# Patient Record
Sex: Male | Born: 1955 | Race: Black or African American | Hispanic: No | Marital: Married | Smoking: Never smoker
Health system: Southern US, Community
[De-identification: ages and names within clinical notes are randomized; demographics above are authoritative.]

## PROBLEM LIST (undated history)

## (undated) DIAGNOSIS — I252 Old myocardial infarction: Secondary | ICD-10-CM

## (undated) DIAGNOSIS — I2510 Atherosclerotic heart disease of native coronary artery without angina pectoris: Secondary | ICD-10-CM

## (undated) DIAGNOSIS — N529 Male erectile dysfunction, unspecified: Secondary | ICD-10-CM

## (undated) DIAGNOSIS — E785 Hyperlipidemia, unspecified: Secondary | ICD-10-CM

## (undated) DIAGNOSIS — T464X5A Adverse effect of angiotensin-converting-enzyme inhibitors, initial encounter: Secondary | ICD-10-CM

## (undated) DIAGNOSIS — R05 Cough: Secondary | ICD-10-CM

## (undated) DIAGNOSIS — Z9289 Personal history of other medical treatment: Secondary | ICD-10-CM

## (undated) DIAGNOSIS — I10 Essential (primary) hypertension: Secondary | ICD-10-CM

## (undated) HISTORY — DX: Old myocardial infarction: I25.2

## (undated) HISTORY — PX: NO PAST SURGERIES: SHX2092

## (undated) HISTORY — DX: Atherosclerotic heart disease of native coronary artery without angina pectoris: I25.10

## (undated) HISTORY — DX: Cough: R05

## (undated) HISTORY — DX: Personal history of other medical treatment: Z92.89

## (undated) HISTORY — DX: Male erectile dysfunction, unspecified: N52.9

## (undated) HISTORY — DX: Adverse effect of angiotensin-converting-enzyme inhibitors, initial encounter: T46.4X5A

## (undated) HISTORY — DX: Essential (primary) hypertension: I10

---

## 1999-02-28 ENCOUNTER — Encounter: Payer: Self-pay | Admitting: Family Medicine

## 1999-03-26 ENCOUNTER — Encounter: Payer: Self-pay | Admitting: Family Medicine

## 1999-03-26 ENCOUNTER — Encounter: Admission: RE | Admit: 1999-03-26 | Discharge: 1999-03-26 | Payer: Self-pay | Admitting: Family Medicine

## 2005-05-12 ENCOUNTER — Emergency Department (HOSPITAL_COMMUNITY): Admission: EM | Admit: 2005-05-12 | Discharge: 2005-05-12 | Payer: Self-pay | Admitting: Family Medicine

## 2005-06-23 ENCOUNTER — Emergency Department (HOSPITAL_COMMUNITY): Admission: EM | Admit: 2005-06-23 | Discharge: 2005-06-23 | Payer: Self-pay | Admitting: Family Medicine

## 2005-06-30 ENCOUNTER — Emergency Department (HOSPITAL_COMMUNITY): Admission: EM | Admit: 2005-06-30 | Discharge: 2005-06-30 | Payer: Self-pay | Admitting: Family Medicine

## 2005-07-02 ENCOUNTER — Encounter: Admission: RE | Admit: 2005-07-02 | Discharge: 2005-07-02 | Payer: Self-pay | Admitting: Family Medicine

## 2005-07-06 ENCOUNTER — Encounter: Admission: RE | Admit: 2005-07-06 | Discharge: 2005-07-06 | Payer: Self-pay | Admitting: Family Medicine

## 2005-07-08 ENCOUNTER — Encounter: Admission: RE | Admit: 2005-07-08 | Discharge: 2005-07-08 | Payer: Self-pay | Admitting: Family Medicine

## 2005-07-22 ENCOUNTER — Encounter: Admission: RE | Admit: 2005-07-22 | Discharge: 2005-07-22 | Payer: Self-pay | Admitting: Family Medicine

## 2007-09-29 IMAGING — CR DG HIP COMPLETE 2+V*R*
2 series · 2 of 2 positions shown · non-contrast
Comparison: none

CLINICAL DATA: Low back and right hip pain for one week after moving boxes. 
 LUMBAR SPINE FOUR VIEWS:
 No comparison.  There are five lumbar type vertebral bodies in anatomic alignment.  Disc space loss is present at L4-5 and there are scattered marginal osteophytes.  Mild facet degenerative changes are present inferiorly.  There is no evidence of acute fracture.

[view not recorded (1 of 2)]
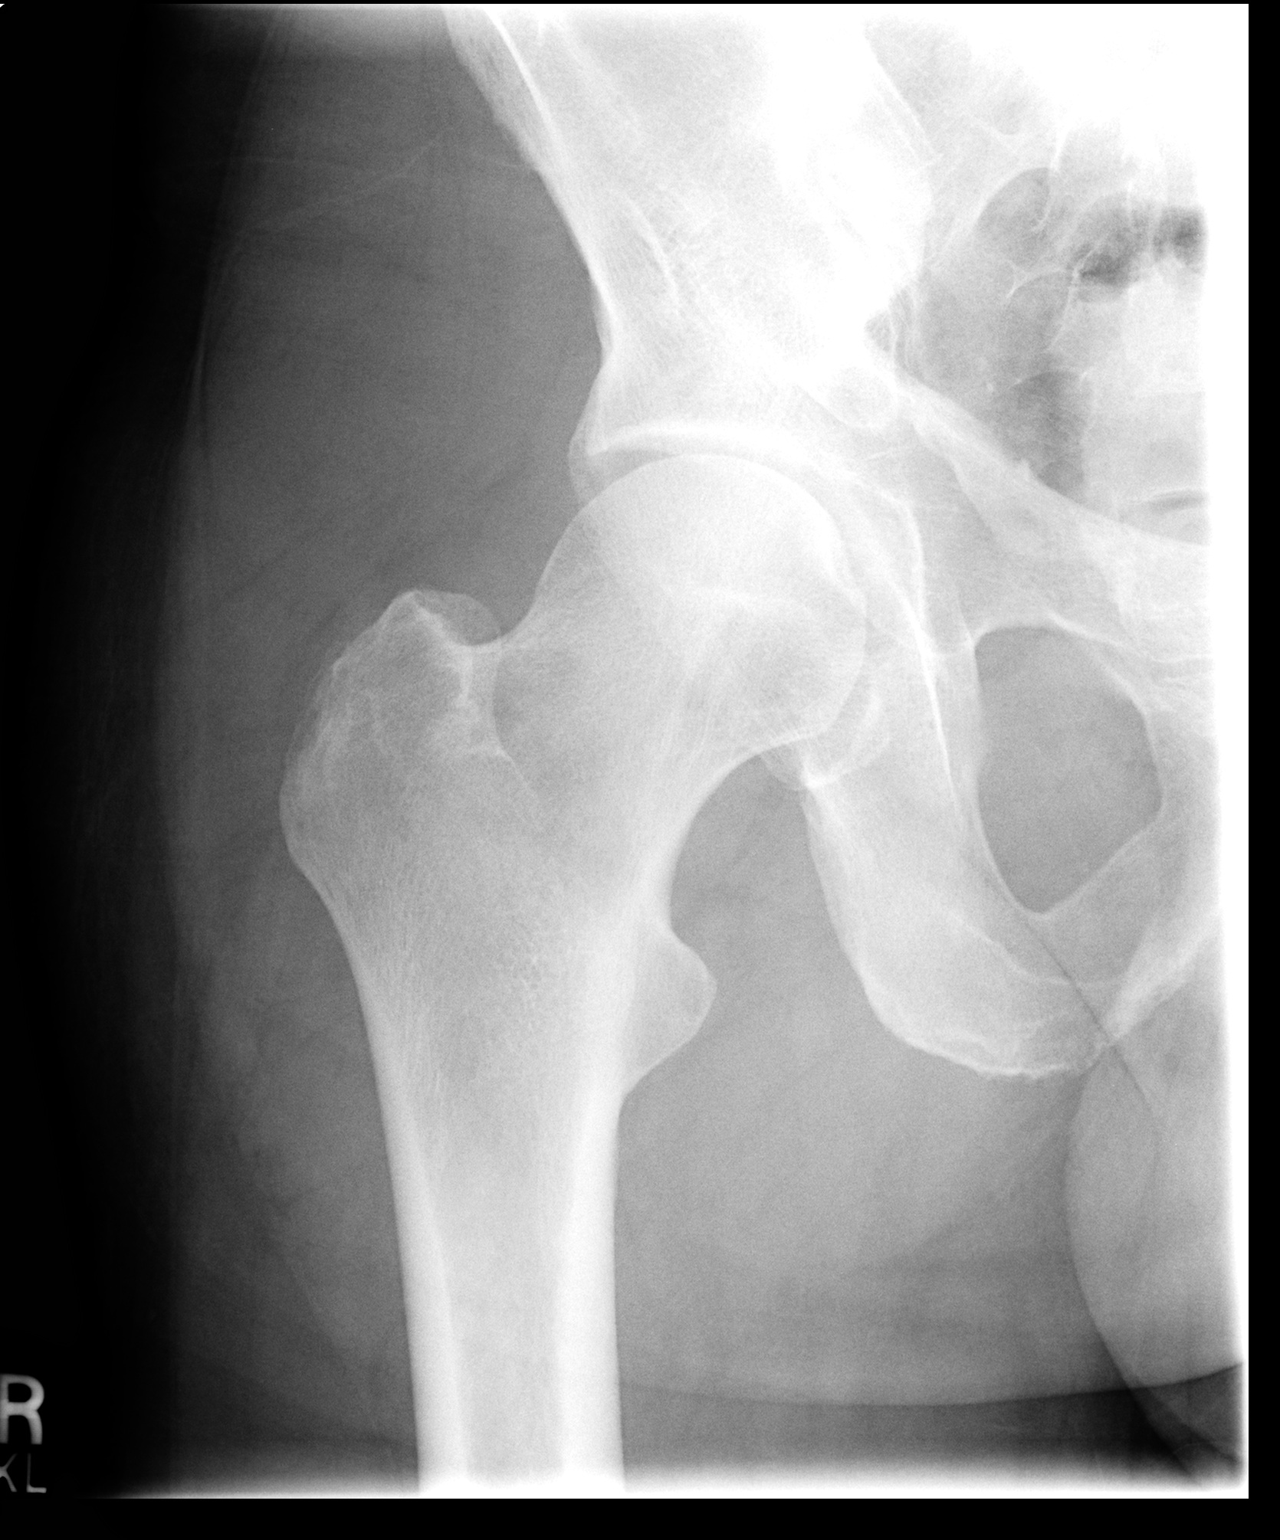

[view not recorded (2 of 2)]
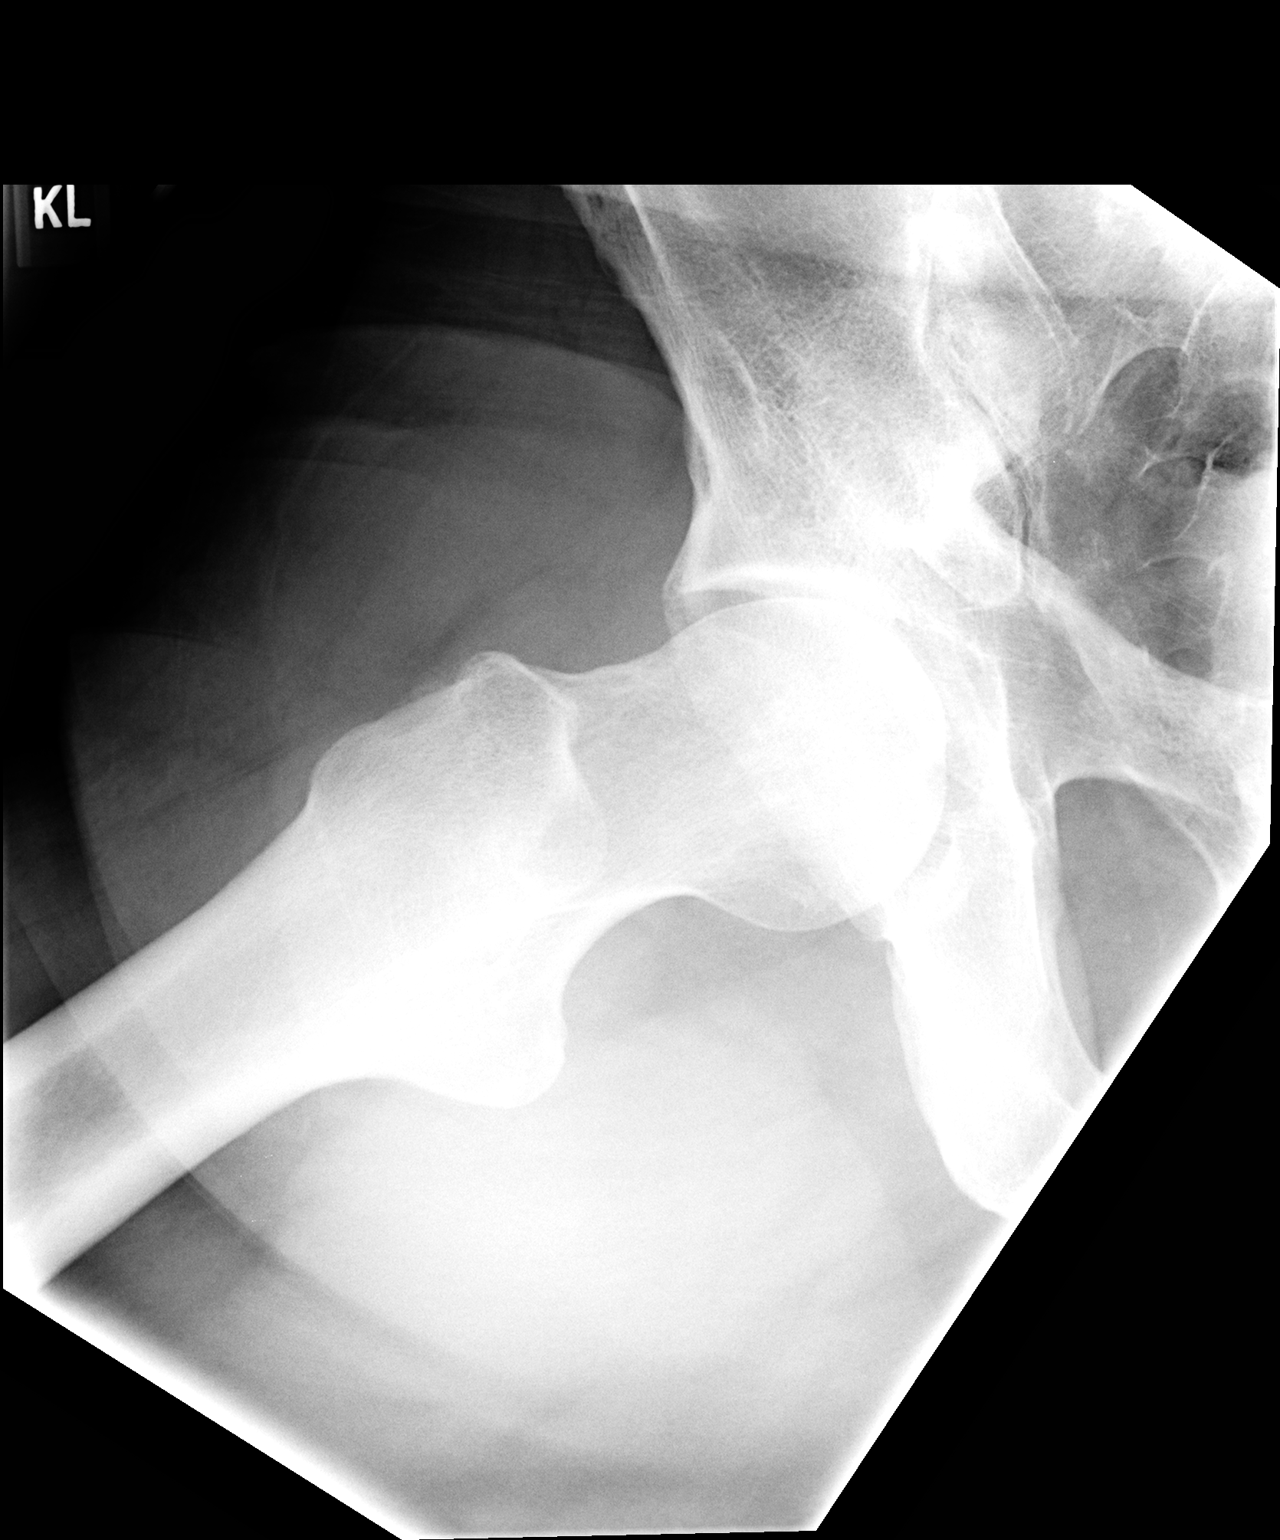

[2 of 2 positions shown; findings below may reference images not displayed]

IMPRESSION: Mild degenerative changes as described with disc space loss at L4-5.  No acute findings. 
 RIGHT HIP TWO VIEWS:
 Negative for acute fracture or dislocation.  The right femoral head appears normal and the right hip joint space is preserved.  There are asymmetric osteophytes of the right sacroiliac joint.
IMPRESSION: No acute osseous findings.

## 2008-11-07 ENCOUNTER — Ambulatory Visit: Payer: Self-pay | Admitting: Gastroenterology

## 2008-12-21 ENCOUNTER — Encounter (INDEPENDENT_AMBULATORY_CARE_PROVIDER_SITE_OTHER): Payer: Self-pay | Admitting: *Deleted

## 2009-07-24 ENCOUNTER — Encounter: Admission: RE | Admit: 2009-07-24 | Discharge: 2009-07-24 | Payer: Self-pay | Admitting: Internal Medicine

## 2010-02-23 ENCOUNTER — Encounter: Payer: Self-pay | Admitting: Family Medicine

## 2013-04-02 ENCOUNTER — Encounter (HOSPITAL_COMMUNITY)
Admission: EM | Disposition: A | Discharge status: Home Health | Payer: BC Managed Care – PPO | Source: Home / Self Care | Attending: Interventional Cardiology

## 2013-04-02 ENCOUNTER — Ambulatory Visit (HOSPITAL_COMMUNITY): Admit: 2013-04-02 | Payer: Self-pay | Admitting: Interventional Cardiology

## 2013-04-02 ENCOUNTER — Encounter (HOSPITAL_COMMUNITY): Payer: Self-pay | Admitting: Emergency Medicine

## 2013-04-02 ENCOUNTER — Inpatient Hospital Stay (HOSPITAL_COMMUNITY)
Admission: EM | Admit: 2013-04-02 | Discharge: 2013-04-05 | DRG: 246 | Disposition: A | Discharge status: Home Health | Payer: BC Managed Care – PPO | Attending: Interventional Cardiology | Admitting: Interventional Cardiology

## 2013-04-02 ENCOUNTER — Other Ambulatory Visit: Payer: Self-pay

## 2013-04-02 DIAGNOSIS — I4901 Ventricular fibrillation: Secondary | ICD-10-CM

## 2013-04-02 DIAGNOSIS — R509 Fever, unspecified: Secondary | ICD-10-CM

## 2013-04-02 DIAGNOSIS — I219 Acute myocardial infarction, unspecified: Secondary | ICD-10-CM

## 2013-04-02 DIAGNOSIS — Z79899 Other long term (current) drug therapy: Secondary | ICD-10-CM

## 2013-04-02 DIAGNOSIS — K047 Periapical abscess without sinus: Secondary | ICD-10-CM | POA: Diagnosis present

## 2013-04-02 DIAGNOSIS — I472 Ventricular tachycardia, unspecified: Secondary | ICD-10-CM | POA: Diagnosis not present

## 2013-04-02 DIAGNOSIS — I2582 Chronic total occlusion of coronary artery: Secondary | ICD-10-CM | POA: Diagnosis present

## 2013-04-02 DIAGNOSIS — I252 Old myocardial infarction: Secondary | ICD-10-CM

## 2013-04-02 DIAGNOSIS — I2119 ST elevation (STEMI) myocardial infarction involving other coronary artery of inferior wall: Secondary | ICD-10-CM

## 2013-04-02 DIAGNOSIS — I469 Cardiac arrest, cause unspecified: Secondary | ICD-10-CM | POA: Diagnosis present

## 2013-04-02 DIAGNOSIS — K029 Dental caries, unspecified: Secondary | ICD-10-CM | POA: Diagnosis present

## 2013-04-02 DIAGNOSIS — Z7902 Long term (current) use of antithrombotics/antiplatelets: Secondary | ICD-10-CM

## 2013-04-02 DIAGNOSIS — R079 Chest pain, unspecified: Secondary | ICD-10-CM

## 2013-04-02 DIAGNOSIS — Z7982 Long term (current) use of aspirin: Secondary | ICD-10-CM

## 2013-04-02 DIAGNOSIS — E785 Hyperlipidemia, unspecified: Secondary | ICD-10-CM | POA: Diagnosis present

## 2013-04-02 DIAGNOSIS — I2510 Atherosclerotic heart disease of native coronary artery without angina pectoris: Secondary | ICD-10-CM | POA: Diagnosis present

## 2013-04-02 DIAGNOSIS — I4729 Other ventricular tachycardia: Secondary | ICD-10-CM | POA: Diagnosis not present

## 2013-04-02 HISTORY — DX: Old myocardial infarction: I25.2

## 2013-04-02 HISTORY — DX: Hyperlipidemia, unspecified: E78.5

## 2013-04-02 HISTORY — DX: Atherosclerotic heart disease of native coronary artery without angina pectoris: I25.10

## 2013-04-02 HISTORY — PX: LEFT HEART CATHETERIZATION WITH CORONARY ANGIOGRAM: SHX5451

## 2013-04-02 LAB — CBC
HEMATOCRIT: 44.6 % (ref 39.0–52.0)
Hemoglobin: 15.2 g/dL (ref 13.0–17.0)
MCH: 29.1 pg (ref 26.0–34.0)
MCHC: 34.1 g/dL (ref 30.0–36.0)
MCV: 85.3 fL (ref 78.0–100.0)
Platelets: 156 10*3/uL (ref 150–400)
RBC: 5.23 MIL/uL (ref 4.22–5.81)
RDW: 12.4 % (ref 11.5–15.5)
WBC: 5.7 10*3/uL (ref 4.0–10.5)

## 2013-04-02 LAB — BASIC METABOLIC PANEL
BUN: 14 mg/dL (ref 6–23)
CHLORIDE: 98 meq/L (ref 96–112)
CO2: 20 meq/L (ref 19–32)
Calcium: 9 mg/dL (ref 8.4–10.5)
Creatinine, Ser: 0.95 mg/dL (ref 0.50–1.35)
Glucose, Bld: 120 mg/dL — ABNORMAL HIGH (ref 70–99)
POTASSIUM: 3.3 meq/L — AB (ref 3.7–5.3)
SODIUM: 138 meq/L (ref 137–147)

## 2013-04-02 LAB — PROTIME-INR
INR: 0.9 (ref 0.00–1.49)
Prothrombin Time: 12 seconds (ref 11.6–15.2)

## 2013-04-02 LAB — I-STAT TROPONIN, ED: Troponin i, poc: 0.03 ng/mL (ref 0.00–0.08)

## 2013-04-02 LAB — PRO B NATRIURETIC PEPTIDE: Pro B Natriuretic peptide (BNP): 34.3 pg/mL (ref 0–125)

## 2013-04-02 SURGERY — LEFT HEART CATHETERIZATION WITH CORONARY ANGIOGRAM
Anesthesia: LOCAL

## 2013-04-02 MED ORDER — NITROGLYCERIN 0.2 MG/ML ON CALL CATH LAB
INTRAVENOUS | Status: AC
  Filled 2013-04-02: qty 1

## 2013-04-02 MED ORDER — SODIUM CHLORIDE 0.9 % IJ SOLN: 3.0000 mL | INTRAMUSCULAR | Status: DC | PRN

## 2013-04-02 MED ORDER — HEPARIN SODIUM (PORCINE) 5000 UNIT/ML IJ SOLN
4000.0000 [IU] | Freq: Once | INTRAMUSCULAR | Status: AC
  Administered 2013-04-02: 4000 [IU] via INTRAVENOUS

## 2013-04-02 MED ORDER — METOPROLOL TARTRATE 12.5 MG HALF TABLET
12.5000 mg | ORAL_TABLET | Freq: Two times a day (BID) | ORAL | Status: DC
  Administered 2013-04-03 – 2013-04-04 (×3): 12.5 mg via ORAL
  Filled 2013-04-02 (×4): qty 1

## 2013-04-02 MED ORDER — VERAPAMIL HCL 2.5 MG/ML IV SOLN
INTRAVENOUS | Status: AC
  Filled 2013-04-02: qty 2

## 2013-04-02 MED ORDER — TIROFIBAN HCL IV 5 MG/100ML
INTRAVENOUS | Status: AC
  Filled 2013-04-02: qty 100

## 2013-04-02 MED ORDER — HEPARIN (PORCINE) IN NACL 2-0.9 UNIT/ML-% IJ SOLN
INTRAMUSCULAR | Status: AC
  Filled 2013-04-02: qty 1000

## 2013-04-02 MED ORDER — SODIUM CHLORIDE 0.9 % IV SOLN
INTRAVENOUS | Status: AC
  Administered 2013-04-02 – 2013-04-03 (×2): via INTRAVENOUS

## 2013-04-02 MED ORDER — FENTANYL CITRATE 0.05 MG/ML IJ SOLN
INTRAMUSCULAR | Status: AC
  Filled 2013-04-02: qty 2

## 2013-04-02 MED ORDER — BIVALIRUDIN 250 MG IV SOLR
INTRAVENOUS | Status: AC
  Filled 2013-04-02: qty 250

## 2013-04-02 MED ORDER — HEPARIN (PORCINE) IN NACL 100-0.45 UNIT/ML-% IJ SOLN
1400.0000 [IU]/h | INTRAMUSCULAR | Status: DC
  Filled 2013-04-02: qty 250

## 2013-04-02 MED ORDER — SODIUM CHLORIDE 0.9 % IJ SOLN
3.0000 mL | Freq: Two times a day (BID) | INTRAMUSCULAR | Status: DC
  Administered 2013-04-03 – 2013-04-04 (×3): 3 mL via INTRAVENOUS

## 2013-04-02 MED ORDER — TICAGRELOR 90 MG PO TABS
ORAL_TABLET | ORAL | Status: AC
  Filled 2013-04-02: qty 1

## 2013-04-02 MED ORDER — HEPARIN SODIUM (PORCINE) 5000 UNIT/ML IJ SOLN
INTRAMUSCULAR | Status: AC
  Filled 2013-04-02: qty 1

## 2013-04-02 MED ORDER — ASPIRIN 81 MG PO CHEW: 81.0000 mg | CHEWABLE_TABLET | Freq: Every day | ORAL | Status: DC

## 2013-04-02 MED ORDER — ASPIRIN EC 81 MG PO TBEC
81.0000 mg | DELAYED_RELEASE_TABLET | Freq: Every day | ORAL | Status: DC
  Administered 2013-04-03 – 2013-04-05 (×3): 81 mg via ORAL
  Filled 2013-04-02 (×3): qty 1

## 2013-04-02 MED ORDER — ASPIRIN 81 MG PO CHEW
324.0000 mg | CHEWABLE_TABLET | Freq: Once | ORAL | Status: AC
  Administered 2013-04-02: 324 mg via ORAL

## 2013-04-02 MED ORDER — SODIUM CHLORIDE 0.9 % IV BOLUS (SEPSIS): 500.0000 mL | Freq: Once | INTRAVENOUS | Status: DC

## 2013-04-02 MED ORDER — LIDOCAINE HCL (PF) 1 % IJ SOLN
INTRAMUSCULAR | Status: AC
  Filled 2013-04-02: qty 30

## 2013-04-02 MED ORDER — SODIUM CHLORIDE 0.9 % IV SOLN: 250.0000 mL | INTRAVENOUS | Status: DC | PRN

## 2013-04-02 MED ORDER — MIDAZOLAM HCL 2 MG/2ML IJ SOLN
INTRAMUSCULAR | Status: AC
  Filled 2013-04-02: qty 2

## 2013-04-02 MED ORDER — ATORVASTATIN CALCIUM 80 MG PO TABS
80.0000 mg | ORAL_TABLET | Freq: Every day | ORAL | Status: DC
  Administered 2013-04-02 – 2013-04-04 (×3): 80 mg via ORAL
  Filled 2013-04-02 (×4): qty 1

## 2013-04-02 MED ORDER — TICAGRELOR 90 MG PO TABS
ORAL_TABLET | ORAL | Status: AC
  Administered 2013-04-03: 90 mg via ORAL
  Filled 2013-04-02: qty 1

## 2013-04-02 MED ORDER — TICAGRELOR 90 MG PO TABS
90.0000 mg | ORAL_TABLET | Freq: Two times a day (BID) | ORAL | Status: DC
  Administered 2013-04-03 – 2013-04-05 (×5): 90 mg via ORAL
  Filled 2013-04-02 (×6): qty 1

## 2013-04-02 MED ORDER — NITROGLYCERIN 0.4 MG SL SUBL: 0.4000 mg | SUBLINGUAL_TABLET | SUBLINGUAL | Status: DC | PRN

## 2013-04-02 MED ORDER — OXYCODONE-ACETAMINOPHEN 5-325 MG PO TABS
1.0000 | ORAL_TABLET | ORAL | Status: DC | PRN
  Administered 2013-04-03: 2 via ORAL
  Administered 2013-04-03: 1 via ORAL
  Filled 2013-04-02: qty 1
  Filled 2013-04-02: qty 2

## 2013-04-02 MED ORDER — TICAGRELOR 90 MG PO TABS: 90.0000 mg | ORAL_TABLET | Freq: Two times a day (BID) | ORAL | Status: DC

## 2013-04-02 NOTE — ED Notes (Signed)
Cards here 

## 2013-04-02 NOTE — ED Notes (Signed)
Pt vf shocked

## 2013-04-02 NOTE — CV Procedure (Addendum)
Left Heart Catheterization with Coronary Angiography and Emergency PCI Report  Mark Levine  58 y.o.  male 1955/03/19  Procedure Date: 04/02/2013  Referring Physician: Emergency Department Primary Cardiologist: HWBSmith, III, MD  INDICATIONS: Acute Inferior STEMI  PROCEDURE: 1. Left heart catheterization; 2. Coronary angiography; 3. Left ventriculography; 4. Drug-eluting stent implantation, RCA  CONSENT:  The risks, benefits, and details of the procedure were explained in detail to the patient. Risks including death, stroke, heart attack, kidney injury, allergy, limb ischemia, bleeding and radiation injury were discussed.  The patient verbalized understanding and wanted to proceed.  Informed written consent was obtained.  PROCEDURE TECHNIQUE:  After Xylocaine anesthesia a 5 French Slender sheath was placed in the right radial artery with a single anterior needle wall stick.  Coronary angiography was done using  5 [place] JR 4 and JL 3.5 diagnostic catheters.  Left ventriculography was done using the JR 4 catheter and hand injection.    The RCA was identified as the culprit. A bolus of bivalirudin was followed by an infusion with a resultant ACT > 300 sec. 180 mg of Brilinta was administered orally. A JR 4 guide catheter and a Pro-water guidewire were used to obtain access in the right coronary and cross the distal RCA total occlusion. Angioplasty with a 2.5 x 12 mm balloon was performed followed by positioning and deployment of a 16 x 3.0 mm Promus Premier DES. Postdilatation was performed with a 3.25 x 12 mm [place] balloon to 14 atmospheres. TIMI grade 3 flow was the result. No evidence of distal emboli was noted.  Post procedure, the bivalirudin infusion was discontinued. A single bolus of Aggrastat was administered.  Hemostasis was achieved with a wristband.  MEDICATIONS: Versed 1 mg; 50 mcg of fentanyl    CONTRAST:  Total of 150 cc.  COMPLICATIONS:  Ventricular fibrillation x  2 in the emergency room requiring defibrillation pre-cath.    HEMODYNAMICS:  Aortic pressure 125/74 mmHg; LV pressure 129/16; LVEDP 23 mmHg  ANGIOGRAPHIC DATA:   The left main coronary artery is widely patent with perhaps 20-25% ostial narrowing..  The left anterior descending artery is widely patent and wraps around the left ventricular apex. Irregularities are noted throughout the mid vessel. At the apex there is an eccentric 70% stenosis..  The left circumflex artery is small and gives origin to one obtuse marginal branch.  The ramus intermedius is large and bifurcates into 2 moderate size vessels. Luminal irregularities are noted..  The right coronary artery is totally occluded in the mid to distal segment. No collaterals are noted.   PCI RESULTS: Total occlusion in the mid to distal RCA was reduced to 0% with TIMI grade 3 flow. The area of total occlusion was focal. Distal to the stented region there is an eccentric 30-40% narrowing. The proximal and mid RCA also contained irregularities with up to 30-40% narrowing.  LEFT VENTRICULOGRAM:  Left ventricular angiogram was done in the 30 RAO projection and revealed moderate inferobasal and mid inferior wall hypokinesis. Ejection fraction estimated at 50%   IMPRESSIONS:  1. Acute inferior wall ST elevation myocardial infarction complicated by ventricular fibrillation x2 in the emergency department requiring defibrillation on each instance.  2. Total occlusion of the mid/distal RCA was reduced to 0% with TIMI grade 3 flow after drug-eluting stent implantation and post dilatation of 3.25 mm .  3. The apical LAD contains an eccentric 50-70% stenosis. Otherwise there are luminal irregularities in both the LAD and circumflex/ramus proximal  segments .  4. Moderate inferobasal and mid air for wall hypokinesis with preserved LVEF of 50%   RECOMMENDATION:  1. Fast track with discharge anticipated 72 hours after reperfusion assuming no  complications.  2. Aspirin,Brilinta, statin therapy, and eventual beta blocker therapy. Beta blocker has not yet been started because of significant bradycardia and amiodarone therapy that was given as a bolus dose after the patient's second ventricular fibrillation episode. We will be able to safely start beta blocker therapy in the a.m.  3. Would anticipate ambulation and Phase 1 cardiac rehabilitation starting in the a.m.

## 2013-04-02 NOTE — ED Notes (Signed)
Attempts to start ivs.  edp  Doing iv lt a-c  20 by [place]

## 2013-04-02 NOTE — ED Notes (Signed)
Pt. reports mid chest pain with SOB , dizziness and headache onset this evening .

## 2013-04-02 NOTE — H&P (Signed)
Mark Levine is an 58 y.o. male.     Chief Complaint: chest Pain HPI: Mark Levine is a 58 yo man with no PMH. 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 He has felt well until sudden dizziness and subsequent chest heaviness/tightness 1-1.5 hours to presentation this evening. His wife urged him to come in. He had some associated nausea but no overt nausea. No recent illness. He has had some headaches over the past several weeks but not associated with chest symptoms or dizziness. Inferior STEMI noted on ECG by ER and STEMI team activated. Shortly after Dr. Katrinka BlazingSmith arrived, Mark Levine became dizzy and ventricular fibrillation noted on the monitor which quickly responded to defibrillation. He had one more episode of ventricular fibrillation a few minutes later that responded to defibrillation. He received 75 mg amiodarone and we came up to the cath lab shortly thereafter. Emergency Cardiac catheterization. He received 4 baby aspirin and 4,000 units of heparin. [name].   History reviewed. No pertinent past medical history.  History reviewed. No pertinent past surgical history. No known family history of CAD, T2DM or sudden cardiac death History reviewed. No pertinent family history. Social History:  reports that he has never smoked. He does not have any smokeless tobacco history on file. He reports that he does not drink alcohol or use illicit drugs.  Allergies: No Known Allergies  No prescriptions prior to admission    Results for orders placed during the hospital encounter of 04/02/13 (from the past 48 hour(s))  CBC     Status: None   Collection Time    04/02/13  8:35 PM      Result Value Ref Range   WBC 5.7  4.0 - 10.5 K/uL   RBC 5.23  4.22 - 5.81 MIL/uL   Hemoglobin 15.2  13.0 - 17.0 g/dL   HCT 09.844.6  11.939.0 - 14.752.0 %   MCV 85.3  78.0 - 100.0 fL   MCH 29.1  26.0 - 34.0 pg   MCHC 34.1  30.0 - 36.0 g/dL   RDW 82.912.4  56.211.5  - 13.015.5 %   Platelets 156  150 - 400 K/uL  PROTIME-INR     Status: None   Collection Time    04/02/13  8:35 PM      Result Value Ref Range   Prothrombin Time 12.0  11.6 - 15.2 seconds   INR 0.90  0.00 - 1.49  I-STAT TROPOININ, ED     Status: None   Collection Time    04/02/13  8:44 PM      Result Value Ref Range   Troponin i, poc 0.03  0.00 - 0.08 ng/mL   Comment 3            Comment: Due to the release kinetics of cTnI,     a negative result within the first hours     of the onset of symptoms does not rule out     myocardial infarction with certainty.     If myocardial infarction is still suspected,     repeat the test at appropriate intervals.   No results found.  Review of Systems  Constitutional: Positive for weight loss. Negative for fever and chills.       With diet; good appetite  HENT: Negative for ear pain.   Eyes: Negative for blurred vision and double vision.  Respiratory: Positive for shortness of breath. Negative  for cough and hemoptysis.   Cardiovascular: Positive for chest pain. Negative for palpitations and orthopnea.  Gastrointestinal: Positive for nausea. Negative for vomiting, blood in stool and melena.  Genitourinary: Negative for dysuria and hematuria.  Musculoskeletal: Negative for back pain, myalgias and neck pain.  Skin: Negative for rash.  Neurological: Positive for dizziness. Negative for tingling, tremors, sensory change and headaches.  Endo/Heme/Allergies: Negative for environmental allergies. Does not bruise/bleed easily.  Psychiatric/Behavioral: Negative for depression, suicidal ideas, hallucinations and substance abuse.    Blood pressure 167/85, pulse 83, resp. rate 20, height 6\' 1"  (1.854 m), weight 97.523 kg (215 lb), SpO2 98.00%. Physical Exam  Nursing note and vitals reviewed. Constitutional: He is oriented to person, place, and time. He appears well-developed and well-nourished. He appears distressed.  HENT:  Head: Normocephalic and  atraumatic.  Nose: Nose normal.  Mouth/Throat: No oropharyngeal exudate.  Eyes: Conjunctivae and EOM are normal. Pupils are equal, round, and reactive to light. No scleral icterus.  Neck: Normal range of motion. Neck supple. No JVD present. No tracheal deviation present. No thyromegaly present.  Cardiovascular: Normal rate, regular rhythm, normal heart sounds and intact distal pulses.  Exam reveals no gallop.   No murmur heard. Respiratory: Effort normal and breath sounds normal. No respiratory distress. He has no wheezes. He has no rales.  GI: Soft. Bowel sounds are normal. He exhibits no distension. There is no tenderness. There is no rebound.  Musculoskeletal: Normal range of motion. He exhibits no edema and no tenderness.  Neurological: He is alert and oriented to person, place, and time. No cranial nerve deficit. Coordination normal.  Skin: Skin is warm. No rash noted. He is diaphoretic. There is erythema.  Psychiatric: He has a normal mood and affect. His behavior is normal. Thought content normal.   labs pending at time of admission  Problem List Acute Inferior ST elevation myocardial infarction Dizziness Ventricular Fibrillation related to intermittent reperfusion requiring defibrillation in the ER bay Assessment/Plan 58 yo man with no PMH, no upcoming surgeries, no bleeding issues, good exercise tolerance with acute dizziness and chest pressure leading to ER presentation and inferior STEMI diagnosis. Risk factors known only of remote tobacco (none in > 20 years), and age > 71.  Loaded with ticagrelor 180 mg in the cath lab.  - emergency LHC for reperfusion, received large aspirin, heparin bolus - lipid panel, tsh, BNP, hba1c - atorvastatin 80 mg now and qHS - asa 81 mg daily - admit to ICU, trend cardiac markers, telemetry - phase I cardiac rehabilitation - ticagrelor 90 mg bid  Mark Levine 04/02/2013, 9:17 PM

## 2013-04-02 NOTE — ED Notes (Signed)
Pt returned and talking both cards at bedside

## 2013-04-02 NOTE — ED Notes (Signed)
ekg  # 2

## 2013-04-02 NOTE — ED Notes (Signed)
V-t  Pt shocked with immediate return and talking

## 2013-04-02 NOTE — ED Notes (Signed)
The pt reports that he feels better at present.  Pt not having chest pain  now

## 2013-04-02 NOTE — Progress Notes (Signed)
ANTICOAGULATION CONSULT NOTE - Initial Consult  Pharmacy Consult for Heparin Indication: chest pain/ACS  No Known Allergies  Patient Measurements: Height: 6\' 1"  (185.4 cm) Weight: 215 lb (97.523 kg) IBW/kg (Calculated) : 79.9 Heparin Dosing Weight: 97kg  Vital Signs: BP: 151/95 mmHg (03/01 2036) Pulse Rate: 63 (03/01 2036)  Labs: No results found for this basename: HGB, HCT, PLT, APTT, LABPROT, INR, HEPARINUNFRC, CREATININE, CKTOTAL, CKMB, TROPONINI,  in the last 72 hours  CrCl is unknown because no creatinine reading has been taken.   Medical History: History reviewed. No pertinent past medical history.  Assessment: 57yom presents to the ED as a Code STEMI. He has already received a 4000 unit bolus of heparin. Pharmacy has been asked to start a drip, although I spoke to the patient's nurse who said he will be going straight to the cath lab. No labs have been drawn.  Goal of Therapy:  Heparin level 0.3-0.7 units/ml Monitor platelets by anticoagulation protocol: Yes   Plan:  1) Heparin @ 1400 units/hr 2) Follow up after cath  Fredrik Rigger 04/02/2013,8:38 PM

## 2013-04-02 NOTE — ED Notes (Signed)
Dr h Katrinka Blazing here to see

## 2013-04-02 NOTE — ED Notes (Signed)
To cath lab 2 doctors and 2 rns

## 2013-04-02 NOTE — ED Notes (Signed)
Heparin  Bolus  4000 units iv push.  No drip ordered stemi

## 2013-04-02 NOTE — Progress Notes (Signed)
Chaplain responded to code stemi and request for family support. Learned that patient was being transported to cath lab from ED. Presented to patient's wife in consultation room during RN's update. Escorted patient's family upstairs to cath lab waiting area, notified medical team of their arrival, and was present during physician's update. Family expressed fear, but hopeful that physician expressed optimism. Provided emotional and spiritual support, prayer, hospitality and caring presence, and empathic listening to family. Family was grateful for chaplain's presence. Please page for additional support.   Maurene Capes [phone]

## 2013-04-03 ENCOUNTER — Inpatient Hospital Stay (HOSPITAL_COMMUNITY): Payer: BC Managed Care – PPO

## 2013-04-03 DIAGNOSIS — I2119 ST elevation (STEMI) myocardial infarction involving other coronary artery of inferior wall: Secondary | ICD-10-CM

## 2013-04-03 LAB — BASIC METABOLIC PANEL
BUN: 12 mg/dL (ref 6–23)
CHLORIDE: 102 meq/L (ref 96–112)
CO2: 25 mEq/L (ref 19–32)
Calcium: 8.5 mg/dL (ref 8.4–10.5)
Creatinine, Ser: 0.8 mg/dL (ref 0.50–1.35)
Glucose, Bld: 125 mg/dL — ABNORMAL HIGH (ref 70–99)
POTASSIUM: 4.2 meq/L (ref 3.7–5.3)
SODIUM: 137 meq/L (ref 137–147)

## 2013-04-03 LAB — CBC
HCT: 41.7 % (ref 39.0–52.0)
Hemoglobin: 14.5 g/dL (ref 13.0–17.0)
MCH: 29.5 pg (ref 26.0–34.0)
MCHC: 34.8 g/dL (ref 30.0–36.0)
MCV: 84.9 fL (ref 78.0–100.0)
PLATELETS: 127 10*3/uL — AB (ref 150–400)
RBC: 4.91 MIL/uL (ref 4.22–5.81)
RDW: 12.4 % (ref 11.5–15.5)
WBC: 5.5 10*3/uL (ref 4.0–10.5)

## 2013-04-03 LAB — COMPREHENSIVE METABOLIC PANEL
ALT: 36 U/L (ref 0–53)
AST: 116 U/L — ABNORMAL HIGH (ref 0–37)
Albumin: 3.4 g/dL — ABNORMAL LOW (ref 3.5–5.2)
Alkaline Phosphatase: 46 U/L (ref 39–117)
BUN: 13 mg/dL (ref 6–23)
CO2: 22 mEq/L (ref 19–32)
CREATININE: 0.89 mg/dL (ref 0.50–1.35)
Calcium: 8.4 mg/dL (ref 8.4–10.5)
Chloride: 101 mEq/L (ref 96–112)
GFR calc non Af Amer: >90 mL/min (ref >90)
Glucose, Bld: 116 mg/dL — ABNORMAL HIGH (ref 70–99)
Potassium: 5.4 mEq/L — ABNORMAL HIGH (ref 3.7–5.3)
SODIUM: 135 meq/L — AB (ref 137–147)
TOTAL PROTEIN: 7.3 g/dL (ref 6.0–8.3)
Total Bilirubin: 0.5 mg/dL (ref 0.3–1.2)

## 2013-04-03 LAB — TROPONIN I
TROPONIN I: 6.2 ng/mL — AB (ref <0.30)
Troponin I: >20 ng/mL (ref <0.30)

## 2013-04-03 LAB — HEMOGLOBIN A1C
Hgb A1c MFr Bld: 5.7 % — ABNORMAL HIGH (ref <5.7)
MEAN PLASMA GLUCOSE: 117 mg/dL — AB (ref <117)

## 2013-04-03 LAB — LIPID PANEL
CHOL/HDL RATIO: 4.2 ratio
CHOLESTEROL: 189 mg/dL (ref 0–200)
HDL: 45 mg/dL (ref >39)
LDL Cholesterol: 107 mg/dL — ABNORMAL HIGH (ref 0–99)
Triglycerides: 184 mg/dL — ABNORMAL HIGH (ref <150)
VLDL: 37 mg/dL (ref 0–40)

## 2013-04-03 LAB — TSH: TSH: 0.614 u[IU]/mL (ref 0.350–4.500)

## 2013-04-03 LAB — MAGNESIUM
Magnesium: 1.9 mg/dL (ref 1.5–2.5)
Magnesium: 1.9 mg/dL (ref 1.5–2.5)

## 2013-04-03 LAB — MRSA PCR SCREENING: MRSA BY PCR: POSITIVE — AB

## 2013-04-03 LAB — PRO B NATRIURETIC PEPTIDE: PRO B NATRI PEPTIDE: 39.1 pg/mL (ref 0–125)

## 2013-04-03 LAB — POCT ACTIVATED CLOTTING TIME: Activated Clotting Time: 354 seconds

## 2013-04-03 MED ORDER — CHLORHEXIDINE GLUCONATE CLOTH 2 % EX PADS
6.0000 | MEDICATED_PAD | Freq: Every day | CUTANEOUS | Status: DC
  Administered 2013-04-03 – 2013-04-04 (×2): 6 via TOPICAL

## 2013-04-03 MED ORDER — MORPHINE SULFATE 2 MG/ML IJ SOLN
2.0000 mg | INTRAMUSCULAR | Status: DC | PRN
  Administered 2013-04-03 (×2): 2 mg via INTRAVENOUS
  Administered 2013-04-04: 4 mg via INTRAVENOUS
  Administered 2013-04-04: 2 mg via INTRAVENOUS
  Filled 2013-04-03: qty 2
  Filled 2013-04-03 (×3): qty 1

## 2013-04-03 MED ORDER — HEPARIN SODIUM (PORCINE) 5000 UNIT/ML IJ SOLN
5000.0000 [IU] | Freq: Three times a day (TID) | INTRAMUSCULAR | Status: DC
  Administered 2013-04-03: 5000 [IU] via SUBCUTANEOUS
  Filled 2013-04-03 (×4): qty 1

## 2013-04-03 MED ORDER — MUPIROCIN 2 % EX OINT
1.0000 "application " | TOPICAL_OINTMENT | Freq: Two times a day (BID) | CUTANEOUS | Status: DC
  Administered 2013-04-03 – 2013-04-05 (×6): 1 via NASAL
  Filled 2013-04-03: qty 22

## 2013-04-03 MED ORDER — ONDANSETRON HCL 4 MG/2ML IJ SOLN
4.0000 mg | Freq: Three times a day (TID) | INTRAMUSCULAR | Status: DC | PRN
  Administered 2013-04-03 – 2013-04-04 (×2): 4 mg via INTRAVENOUS
  Filled 2013-04-03 (×2): qty 2

## 2013-04-03 MED FILL — Sodium Chloride IV Soln 0.9%: INTRAVENOUS | Qty: 50 | Status: AC

## 2013-04-03 MED FILL — Medication: Qty: 1 | Status: AC

## 2013-04-03 NOTE — Progress Notes (Signed)
Pt c/o headache that is causing dizzyness when he stands; Theodore Demark, PA paged and made aware; new order received to obtain [facility]

## 2013-04-03 NOTE — Progress Notes (Signed)
CARDIAC REHAB PHASE I   PRE:  Rate/Rhythm: 76  BP:  Supine:   Sitting:   Standing:    SaO2:   MODE:  Ambulation:  ft   POST:  Rate/Rhythem:   BP:  Supine:   Sitting:   Standing:    SaO2:  2:50 -3:02  Oriented to Cardiac Rehab Phase 1 and Phase 2.  He is complaining of headache, Patient transport here to take patient for CT Scan.  Advised patient we will follow up discharge information tomorrow.    Cathie Olden RN  Vinetta Bergamo, Lavon Paganini

## 2013-04-03 NOTE — Progress Notes (Signed)
    Subjective:  Denies CP or dyspnea   Objective:  Filed Vitals:   04/03/13 0300 04/03/13 0400 04/03/13 0500 04/03/13 0600  BP: 146/77 152/93 132/84 132/89  Pulse: 77 78 71 74  Temp:  98.4 F (36.9 C)    TempSrc:  Oral    Resp: 17 16 15 16   Height:      Weight:   224 lb 13.9 oz (102 kg)   SpO2: 100% 100% 99% 99%    Intake/Output from previous day:  Intake/Output Summary (Last 24 hours) at 04/03/13 9450 Last data filed at 04/03/13 0600  Gross per 24 hour  Intake 942.58 ml  Output   1170 ml  Net -227.42 ml    Physical Exam: Physical exam: Well-developed well-nourished in no acute distress.  Skin is warm and dry.  HEENT is normal.  Neck is supple.  Chest is clear to auscultation with normal expansion.  Cardiovascular exam is regular rate and rhythm.  Abdominal exam nontender or distended. No masses palpated. Extremities show no edema. Right radial cath site with no hematoma neuro grossly intact    Lab Results: Basic Metabolic Panel:  Recent Labs  38/88/28 2035 04/02/13 2325 04/03/13 0315  NA 138 135* 137  K 3.3* 5.4* 4.2  CL 98 101 102  CO2 20 22 25   GLUCOSE 120* 116* 125*  BUN 14 13 12   CREATININE 0.95 0.89 0.80  CALCIUM 9.0 8.4 8.5  MG  --  1.9  --    CBC:  Recent Labs  04/02/13 2035 04/03/13 0315  WBC 5.7 5.5  HGB 15.2 14.5  HCT 44.6 41.7  MCV 85.3 84.9  PLT 156 127*   Cardiac Enzymes:  Recent Labs  04/02/13 2325 04/03/13 0315  TROPONINI 6.20* >20.00*     Assessment/Plan:  1 status post inferior myocardial infarction-the patient is pain-free. He is status post PCI of his right coronary artery. Continue aspirin, brilinta, low-dose metoprolol and Lipitor. Ambulate. Cardiac rehabilitation. Transfer to telemetry. 2 CAD-patient has residual LAD disease. Plan medical therapy as outlined above.  Olga Millers 04/03/2013, 7:12 AM

## 2013-04-03 NOTE — Progress Notes (Signed)
Utilization Review Completed.Mark Levine T3/03/2013  

## 2013-04-03 NOTE — ED Provider Notes (Signed)
CSN: [id number]     Arrival date & time 04/02/13  2008 History   First MD Initiated Contact with Patient 04/02/13 2014     Chief Complaint  Patient presents with  . Chest Pain     (Consider location/radiation/quality/duration/timing/severity/associated sxs/prior Treatment) HPI Comments: 58 yo male with no significant medical hx presents with anterior chest discomfort, pressure with nausea and mild sob since 2 hrs PTA.  No hx of similar.  Mild left arm radiation.  No cardiac hx known.  No recent exertional sxs. No recent surgery or blood clot hx.  Constant, improved since.    The history is provided by the patient.    Past Medical History  Diagnosis Date  . Medical history non-contributory    Past Surgical History  Procedure Laterality Date  . No past surgeries     History reviewed. No pertinent family history. History  Substance Use Topics  . Smoking status: Never Smoker   . Smokeless tobacco: Not on file  . Alcohol Use: No    Review of Systems  Constitutional: Positive for appetite change. Negative for fever and chills.  HENT: Negative for congestion.   Eyes: Negative for visual disturbance.  Respiratory: Positive for shortness of breath. Negative for cough.   Cardiovascular: Positive for chest pain. Negative for leg swelling.  Gastrointestinal: Positive for nausea. Negative for vomiting and abdominal pain.  Genitourinary: Negative for dysuria and flank pain.  Musculoskeletal: Negative for back pain, neck pain and neck stiffness.  Skin: Negative for rash.  Neurological: Positive for light-headedness and headaches.      Allergies  Review of patient's allergies indicates no known allergies.  Home Medications  No current outpatient prescriptions on file. BP 143/81  Pulse 68  Temp(Src) 97.6 F (36.4 C) (Oral)  Resp 18  Ht 6\' 1"  (1.854 m)  Wt 215 lb (97.523 kg)  BMI 28.37 kg/m2  SpO2 100% Physical Exam  Nursing note and vitals reviewed. Constitutional: He is  oriented to person, place, and time. He appears well-developed and well-nourished.  HENT:  Head: Normocephalic and atraumatic.  Eyes: Conjunctivae are normal. Right eye exhibits no discharge. Left eye exhibits no discharge.  Neck: Normal range of motion. Neck supple. No tracheal deviation present.  Cardiovascular: Normal rate and regular rhythm.   Pulmonary/Chest: Effort normal and breath sounds normal.  Abdominal: Soft. He exhibits no distension. There is no tenderness. There is no guarding.  Musculoskeletal: He exhibits no edema.  Neurological: He is alert and oriented to person, place, and time.  Skin: Skin is warm. No rash noted. He is diaphoretic.  Psychiatric: He has a normal mood and affect.    ED Course  Procedures (including critical care time) Emergency Ultrasound Study:   Angiocath insertion Performed by: Enid Skeens  Consent: Verbal consent obtained. Risks and benefits: risks, benefits and alternatives were discussed Immediately prior to procedure the correct patient, procedure, equipment, support staff and site/side marked as needed.  Indication: difficult IV access Preparation: Patient was prepped and draped in the usual sterile fashion. Vein Location: right AC visualized during assessment for potential access sites and was found to be patent/ easily compressed with linear ultrasound.  The needle was visualized with real-time ultrasound and guided into the vein. Gauge: 20 g  Image saved and stored.  Normal blood return.  Patient tolerance: Patient tolerated the procedure well with no immediate complications.    Labs Review Labs Reviewed  MRSA PCR SCREENING - Abnormal; Notable for the following:  MRSA by PCR POSITIVE (*)    All other components within normal limits  BASIC METABOLIC PANEL - Abnormal; Notable for the following:    Potassium 3.3 (*)    Glucose, Bld 120 (*)    All other components within normal limits  TROPONIN I - Abnormal; Notable for the  following:    Troponin I 6.20 (*)    All other components within normal limits  COMPREHENSIVE METABOLIC PANEL - Abnormal; Notable for the following:    Sodium 135 (*)    Potassium 5.4 (*)    Glucose, Bld 116 (*)    Albumin 3.4 (*)    AST 116 (*)    All other components within normal limits  CBC  PRO B NATRIURETIC PEPTIDE  PROTIME-INR  MAGNESIUM  PRO B NATRIURETIC PEPTIDE  TROPONIN I  TROPONIN I  TSH  HEMOGLOBIN A1C  CBC  BASIC METABOLIC PANEL  LIPID PANEL  I-STAT TROPOININ, ED   Imaging Review No results found.    EKG Interpretation   Date/Time:  Sunday April 02 2013 20:10:26 EST Ventricular Rate:  68 PR Interval:  178 QRS Duration: 94 QT Interval:  428 QTC Calculation: 455 R Axis:   74 Text Interpretation:  Critical Test Result: STEMI Normal sinus rhythm  Septal infarct , age undetermined Inferior injury pattern ACUTE MI /  STEMI  Consider right ventricular involvement in acute inferior  infarct Abnormal ECG Confirmed by Yeshaya Vath  MD, Nesha Counihan ([id number]) on 04/03/2013  1:04:58 AM MDM   Final diagnoses:  Acute MI  Acute chest pain   Acute chest pain, no risk factors, pt healthy/ exercises regularly. EKG reviewed from triage, assessed patient immediately and called code STEMI. Pt placed on monitor ASA given, interventionalist called/ cath team called in. CP improved in ED.   Difficult IV, I placed right AC US guided.  Cardiology at bedside, pt at episode of v fib, shocked 200 J. NSR.  Pt sent emergently to cath lab.  CP improved prior to going upstairs.  Heparin given. Updated his wife on plan. CRITICAL CARE Performed by: Enid SkeensZAVITZ, Elizet Kaplan M Total critical care time: 35 min  Critical care time was exclusive of separately billable procedures and treating other patients.  Critical care was necessary to treat or prevent imminent or life-threatening deterioration.  Critical care was time spent personally by me on the following activities: development of treatment  plan with patient and/or surrogate as well as nursing, discussions with consultants, evaluation of patient's response to treatment, examination of patient, obtaining history from patient or surrogate, ordering and performing treatments and interventions, ordering and review of laboratory studies, ordering and review of radiographic studies, pulse oximetry and re-evaluation of patient's condition.  The patients results and plan were reviewed and discussed.   Any x-rays performed were personally reviewed by myself.   Differential diagnosis were considered with the presenting HPI.  EKG: acute STEMI  Admission/ observation were discussed with the admitting physician, patient and/or family and they are comfortable with the plan.      Enid SkeensJoshua M Chinelo Benn, MD 04/03/13 [phone]

## 2013-04-03 NOTE — Progress Notes (Addendum)
Pt with 12 beat run of VT folled by multiple 3 beat runs of VT; pt sitting up in bed asymtpomatic, VSS; currently pt NSR, Rhnoda Barrett,PA paged and made aware; order received to draw Mg level; will continue to monitor

## 2013-04-03 NOTE — Progress Notes (Signed)
CRITICAL VALUE ALERT  Critical value received:  Troponin 6.20  Date of notification:  04/03/13  Time of notification:  1217  Critical value read back:yes  Nurse who received alert:  Toula Moos  MD notified (1st page):  N/A expected value

## 2013-04-04 ENCOUNTER — Encounter (HOSPITAL_COMMUNITY): Payer: Self-pay | Admitting: Nurse Practitioner

## 2013-04-04 DIAGNOSIS — I2510 Atherosclerotic heart disease of native coronary artery without angina pectoris: Secondary | ICD-10-CM

## 2013-04-04 DIAGNOSIS — E785 Hyperlipidemia, unspecified: Secondary | ICD-10-CM

## 2013-04-04 DIAGNOSIS — I4729 Other ventricular tachycardia: Secondary | ICD-10-CM

## 2013-04-04 DIAGNOSIS — R509 Fever, unspecified: Secondary | ICD-10-CM

## 2013-04-04 DIAGNOSIS — I472 Ventricular tachycardia: Secondary | ICD-10-CM

## 2013-04-04 HISTORY — DX: Atherosclerotic heart disease of native coronary artery without angina pectoris: I25.10

## 2013-04-04 LAB — URINALYSIS, ROUTINE W REFLEX MICROSCOPIC
BILIRUBIN URINE: NEGATIVE
Glucose, UA: NEGATIVE mg/dL
Hgb urine dipstick: NEGATIVE
KETONES UR: NEGATIVE mg/dL
LEUKOCYTES UA: NEGATIVE
Nitrite: NEGATIVE
PH: 7 (ref 5.0–8.0)
PROTEIN: NEGATIVE mg/dL
Specific Gravity, Urine: 1.024 (ref 1.005–1.030)
Urobilinogen, UA: 2 mg/dL — ABNORMAL HIGH (ref 0.0–1.0)

## 2013-04-04 MED ORDER — LISINOPRIL 2.5 MG PO TABS
2.5000 mg | ORAL_TABLET | Freq: Every day | ORAL | Status: DC
  Administered 2013-04-04 – 2013-04-05 (×2): 2.5 mg via ORAL
  Filled 2013-04-04 (×2): qty 1

## 2013-04-04 MED ORDER — AMOXICILLIN-POT CLAVULANATE 875-125 MG PO TABS
1.0000 | ORAL_TABLET | Freq: Two times a day (BID) | ORAL | Status: DC
  Administered 2013-04-04 – 2013-04-05 (×3): 1 via ORAL
  Filled 2013-04-04 (×4): qty 1

## 2013-04-04 MED ORDER — METOPROLOL TARTRATE 25 MG PO TABS
25.0000 mg | ORAL_TABLET | Freq: Two times a day (BID) | ORAL | Status: DC
  Administered 2013-04-04 – 2013-04-05 (×2): 25 mg via ORAL
  Filled 2013-04-04 (×3): qty 1

## 2013-04-04 NOTE — Progress Notes (Signed)
Pt had temp of 101.2  Dr Shirlee Latch paged and notified. Will monitor and recheck as ordered. Dierdre Highman, RN

## 2013-04-04 NOTE — Care Management Note (Signed)
    Page 1 of 1   04/04/2013     11:35:02 AM   CARE MANAGEMENT NOTE 04/04/2013  Patient:  Mark Levine, Mark Levine   Account Number:  [id number]  Date Initiated:  04/04/2013  Documentation initiated by:  GRAVES-BIGELOW,Samaiyah Howes  Subjective/Objective Assessment:   Pt admitted for [facility]. Plan for d/c today.     Action/Plan:   CM did provide pt with Brilinta 30 day free / savings card. Co pay will be 18.00. Will make pt aware. MD please write Rx for 30 day free no refills and the original with refills. No further needs from CM at this time.   Anticipated DC Date:  04/04/2013   Anticipated DC Plan:  HOME/SELF CARE      DC Planning Services  CM consult  Medication Assistance      Choice offered to / List presented to:             Status of service:  Completed, signed off Medicare Important Message given?   (If response is "NO", the following Medicare IM given date fields will be blank) Date Medicare IM given:   Date Additional Medicare IM given:    Discharge Disposition:  HOME/SELF CARE  Per UR Regulation:  Reviewed for med. necessity/level of care/duration of stay  If discussed at Long Length of Stay Meetings, dates discussed:    Comments:

## 2013-04-04 NOTE — Progress Notes (Signed)
CARDIAC REHAB PHASE I   PRE:  Rate/Rhythm: 75 SR    BP: sitting 127/85    SaO2:   MODE:  Ambulation: 550 ft   POST:  Rate/Rhythm: 78 SR    BP: sitting 135/78     SaO2:   Tolerated well, no c/o except HA, he thinks from tooth issues. Had recent root canal and might need tooth extracted. Began ed however visitor came in. Will f/u later if for d/c. Encouraged more walking. [phone]   Elissa Lovett Leeds CES, ACSM 04/04/2013 10:22 AM

## 2013-04-04 NOTE — Progress Notes (Signed)
Notified by CCMD having infrequent 3-6 bt runs of non sustained VT. Pt asymptomatic/strips saved on monitors. Dierdre Highman, RN

## 2013-04-04 NOTE — Progress Notes (Addendum)
Patient Name: Mark Levine Date of Encounter: 04/04/2013   Principal Problem:   ST elevation myocardial infarction (STEMI) of inferior wall Active Problems:   Ventricular fibrillation   Cardiac arrest   CAD (coronary artery disease)   Fever   Ventricular tachycardia   Hyperlipidemia   SUBJECTIVE  No chest pain or sob.  Overall feels well but continues to c/o a headache and right lower molar sensitivity @ the site of a previous root canal about 1 month ago.  Overnight, he spiked a fever of 101.2.  This has since resolved w/o acetaminophen, though he did get ASA 81 @ 10:26 AM.  He is eager to go home.  CURRENT MEDS . aspirin EC  81 mg Oral Daily  . atorvastatin  80 mg Oral q1800  . Chlorhexidine Gluconate Cloth  6 each Topical Q0600  . metoprolol tartrate  12.5 mg Oral BID  . mupirocin ointment  1 application Nasal BID  . sodium chloride  3 mL Intravenous Q12H  . Ticagrelor  90 mg Oral BID    OBJECTIVE  Filed Vitals:   04/03/13 2255 04/04/13 0541 04/04/13 0739 04/04/13 1026  BP:  125/73  121/72  Pulse:  83  75  Temp: 99.1 F (37.3 C) 99.3 F (37.4 C) 99.8 F (37.7 C) 98.8 F (37.1 C)  TempSrc: Oral Oral Oral Oral  Resp:  18    Height:      Weight:  223 lb 14.4 oz (101.56 kg)    SpO2:  98%     No intake or output data in the 24 hours ending 04/04/13 1117 Filed Weights   04/03/13 0500 04/03/13 1115 04/04/13 0541  Weight: 224 lb 13.9 oz (102 kg) 220 lb (99.791 kg) 223 lb 14.4 oz (101.56 kg)    PHYSICAL EXAM  General: Pleasant, NAD. Neuro: Alert and oriented X 3. Moves all extremities spontaneously. Psych: Normal affect. HEENT:  Normal  Neck: Supple without bruits or JVD. Lungs:  Resp regular and unlabored, CTA. Heart: RRR no s3, s4, or murmurs. Abdomen: Soft, non-tender, non-distended, BS + x 4.  Extremities: No clubbing, cyanosis or edema. DP/PT/Radials 2+ and equal bilaterally.  R wrist cath site w/o bleeding/bruit/hematoma.  Accessory Clinical  Findings  CBC  Recent Labs  04/02/13 2035 04/03/13 0315  WBC 5.7 5.5  HGB 15.2 14.5  HCT 44.6 41.7  MCV 85.3 84.9  PLT 156 127*   Basic Metabolic Panel  Recent Labs  04/02/13 2325 04/03/13 0315 04/03/13 1231  NA 135* 137  --   K 5.4* 4.2  --   CL 101 102  --   CO2 22 25  --   GLUCOSE 116* 125*  --   BUN 13 12  --   CREATININE 0.89 0.80  --   CALCIUM 8.4 8.5  --   MG 1.9  --  1.9   Liver Function Tests  Recent Labs  04/02/13 2325  AST 116*  ALT 36  ALKPHOS 46  BILITOT 0.5  PROT 7.3  ALBUMIN 3.4*   Cardiac Enzymes  Recent Labs  04/02/13 2325 04/03/13 0315 04/03/13 0911  TROPONINI 6.20* >20.00* >20.00*   Hemoglobin A1C  Recent Labs  04/02/13 2325  HGBA1C 5.7*   Fasting Lipid Panel  Recent Labs  04/03/13 0315  CHOL 189  HDL 45  LDLCALC 107*  TRIG 184*  CHOLHDL 4.2   Thyroid Function Tests  Recent Labs  04/02/13 2325  TSH 0.614  TELE  Rsr, 8 beat run of  NSVT overnight -  Asymptomatic.  ECG  Rsr, 77, inf q's/inf twi.  Radiology/Studies  Ct Head Wo Contrast  04/03/2013   CLINICAL DATA:  Headache and dizziness  EXAM: CT HEAD WITHOUT CONTRAST  TECHNIQUE: Contiguous axial images were obtained from the base of the skull through the vertex without intravenous contrast.  COMPARISON:  None.  FINDINGS: Ventricle size is normal. Negative for acute or chronic infarction. Negative for hemorrhage or fluid collection. Negative for mass or edema. No shift of the midline structures.  Calvarium is intact.  IMPRESSION: Normal   Electronically Signed   By: Marlan Palau M.D.   On: 04/03/2013 15:18   Dg Chest Port 1v Same Day  04/03/2013   CLINICAL DATA:  Chest pain, CHF  EXAM: PORTABLE CHEST - 1 VIEW SAME DAY  COMPARISON:  06/24/2009  FINDINGS: Relatively low lung volumes with resultant crowding of perihilar and bibasilar bronchovascular structures. Can't exclude mild pulmonary vascular congestion. No focal infiltrate or overt edema however. Heart size  upper limits normal for technique. . No effusion. Visualized skeletal structures are unremarkable.  IMPRESSION: Low lung volumes, possible central pulmonary vascular congestion.   Electronically Signed   By: Oley Balm M.D.   On: 04/03/2013 08:22    ASSESSMENT AND PLAN  1.  Acute Inf STEMI/CAD/VF arrest:  S/p PCI/DES to the occluded RCA on 3/1.  No further chest pain or dyspnea.  He has been ambulating w/o difficulty.  Cont asa, brilinta, bb, statin.    2.  Hyperlipidemia:  LDL 107.  AST was elevated on admission in setting of acute MI.  Cont high potency statin.  F/U LFT's.  3.  Headaches:  He is certain that these are secondary to dental pain in setting of recent root canal on right.  He says that that the tooth is sensitive to touch but his mouth doesn't necessarily hurt.  Head CT did not show any abnl findings to account for headaches.  Will empirically Rx augmentin for presumed abscess in setting of fevers.  He will need early dentistry f/u following discharge.  He understands that he would not be able to come off of asa/brilinta if the tooth required extraction.  4.  Tooth sensitivity/Dental Caries:  See above.  5.  NSVT:  Yesterday he was noted to have reperfusion arrhythmias. BB just added yesterday.  He had an 8 beat run of asymptomatic NSVT overnight.  LV fxn nl by LV gram.  Will push bb to 25mg  bid.  Observe today with plan for d/c tomorrow.  Signed, Nicolasa Ducking NP     Patient seen and examined. Agree with assessment and plan. Day 2 s/p inferior STEMI due to RCA mid occlusion. Pt has concomitant LAD disease. Initially he developed VF. He has had several recurrent episodes on NSVT, last 2 am this morning. Will increase metoprolol to 25 mg bid today with plans to further titrate as BP and P allow, and add low dose ACE-I post MI. ECG today with NSR at 77 with evolving inferior T wave inversion and mildly inferolaterally. Pt had repeat dental procedure last week after a prior  root canal 1 month ago. Consider empiric augmentin in event of dental abscess with fever and dental soreness and dental f/u as outpatient. Discussed the importance of DAPT post stent and that this will not be able to dc if another dental procedure is necessary in the immediate future. AST increased on admission may be due to MI. Will recheck in am and if LFT's  elevated, the may need to adjust statin therapy.   Lennette Bihari, MD, [facility] 04/04/2013 11:44 AM

## 2013-04-05 LAB — COMPREHENSIVE METABOLIC PANEL
ALBUMIN: 3.4 g/dL — AB (ref 3.5–5.2)
ALK PHOS: 65 U/L (ref 39–117)
ALT: 34 U/L (ref 0–53)
AST: 65 U/L — AB (ref 0–37)
BUN: 19 mg/dL (ref 6–23)
CO2: 27 mEq/L (ref 19–32)
Calcium: 9.1 mg/dL (ref 8.4–10.5)
Chloride: 103 mEq/L (ref 96–112)
Creatinine, Ser: 1.19 mg/dL (ref 0.50–1.35)
GFR calc Af Amer: 77 mL/min — ABNORMAL LOW (ref >90)
GFR calc non Af Amer: 67 mL/min — ABNORMAL LOW (ref >90)
Glucose, Bld: 83 mg/dL (ref 70–99)
Potassium: 4.3 mEq/L (ref 3.7–5.3)
Sodium: 143 mEq/L (ref 137–147)
TOTAL PROTEIN: 7.2 g/dL (ref 6.0–8.3)
Total Bilirubin: 0.4 mg/dL (ref 0.3–1.2)

## 2013-04-05 MED ORDER — LISINOPRIL 2.5 MG PO TABS: 2.5000 mg | ORAL_TABLET | Freq: Every day | ORAL | Status: DC

## 2013-04-05 MED ORDER — METOPROLOL TARTRATE 50 MG PO TABS: 50.0000 mg | ORAL_TABLET | Freq: Two times a day (BID) | ORAL | Status: DC

## 2013-04-05 MED ORDER — TICAGRELOR 90 MG PO TABS: 90.0000 mg | ORAL_TABLET | Freq: Two times a day (BID) | ORAL | Status: DC

## 2013-04-05 MED ORDER — AMOXICILLIN-POT CLAVULANATE 875-125 MG PO TABS: 1.0000 | ORAL_TABLET | Freq: Two times a day (BID) | ORAL | Status: DC

## 2013-04-05 MED ORDER — ATORVASTATIN CALCIUM 40 MG PO TABS: 40.0000 mg | ORAL_TABLET | Freq: Every day | ORAL | Status: DC

## 2013-04-05 MED ORDER — NITROGLYCERIN 0.4 MG SL SUBL: 0.4000 mg | SUBLINGUAL_TABLET | SUBLINGUAL | Status: DC | PRN

## 2013-04-05 MED ORDER — ASPIRIN 81 MG PO TBEC: 81.0000 mg | DELAYED_RELEASE_TABLET | Freq: Every day | ORAL | Status: AC

## 2013-04-05 NOTE — Progress Notes (Signed)
Ed completed. Pt with good reception. Interested in [facility] in [place] and will send referral. [phone] Ethelda Chick CES, ACSM 11:43 AM 04/05/2013

## 2013-04-05 NOTE — Discharge Summary (Signed)
Physician Discharge Summary  Patient ID: Mark Levine MRN: [id number] DOB/AGE: [birth year] 58 y.o.  Admit date: 04/02/2013 Discharge date: 04/05/2013 Primary Cardiologist: Dr. Katrinka Blazing  Admission Diagnoses: STEMI  Discharge Diagnoses:  Principal Problem:   ST elevation myocardial infarction (STEMI) of inferior wall Active Problems:   CAD -s/p PCI + DES to RCA 04/02/13   Ventricular fibrillation   Cardiac arrest   Hyperlipidemia   Fever   Ventricular tachycardia   Discharged Condition: stable  Hospital Course: The patient is a 58 y/o AAM, with no prior PMH, who presented to the [facility] ER on 04/02/13 with a complaint of sudden dizziness and substernal chest pressure/tightness. In the ER, an EKG revealed inferior ST elevations. Code STEMI was activated. Prior to PCI his heart rhythm became unstable and he went into ventricular fibrillation x 2. Each episode responded to defibrillation. He required a total of 2 shocks. He was administered amiodarone in the cath lab. He then underwent emergent PCI. The procedure was performed by Dr. Katrinka Blazing via the right radial artery. He was found to have total occlusion in the mid to distal RCA. This was successfully treated with PCI utilizing a DES. He was also noted to have luminal irregularities in both the LAD and circumflex/ramus proximal segments, with 50-70% stenosis in the apical LAD.  Moderate inferobasal and mid apical wall hypokinesis was noted with preserved LVEF of 50%. He left the cath lab in stable condition. He was started on DAPT with ASA + Brilinta. He had no further chest pain. He was noted to have several recurrent episodes of NSVT on telemetry. His metoprolol was increased to 25 mg BID for better beta blockage. He had no further VT/NSVT, but did have occasional PVCs. His metoprolol was ultimately increased to 50 mg BID prior to discharge. His BP remained stable. He was also started on a low dose of lisinopril. It should be noted that he had elevated LFTs  on admission. AST was initially 116. There was question as to if he should be continued on his statin. However, F/U LFTs prior to discharge demonstrated improvement with an AST of 65. It was felt that the elevation was subsequent to his MI. Therefore it was decided to resume Lipitor, however the dose was reduced from 80 mg daily to 40 mg.  Cardiac rehab was consulted to help with ambulation. He had no recurrent chest pain and no SOB. The right radial access site remained stable.    In addition to his medical issues, the patient complained of a headache and right lower molar sensitivity, at the site of a previous root canal about 1 month ago. He had also spiked a fever of 101.2. It was decided to treat empirically with Augmentin, in the event of a dental abscess. He was started on 875-125 mg BID. He noted significant improvement in symptoms. He was ordered to continue for a total of 10 days, post discharge, and to f/u with his dentist.   On hospital day 3, he was seen and examined by Dr. Tresa Endo, who felt that he was stable for discharge home, as he had no further cardiac issues. He was discharged on DAPT with ASA + Brilinta, a BB, an ACE-I, a statin and PRN SL NTG. He was also given a prescription for Augmentin. He will have post hospital f/u in 2 weeks with Tereso Newcomer, PA-C. He will need to continue routine cardiovascular care with Dr. Katrinka Blazing.    Consults: None  Significant Diagnostic Studies:   Emergent LHC  04/02/13 HEMODYNAMICS: Aortic pressure 125/74 mmHg; LV pressure 129/16; LVEDP 23 mmHg  ANGIOGRAPHIC DATA: The left main coronary artery is widely patent with perhaps 20-25% ostial narrowing..  The left anterior descending artery is widely patent and wraps around the left ventricular apex. Irregularities are noted throughout the mid vessel. At the apex there is an eccentric 70% stenosis..  The left circumflex artery is small and gives origin to one obtuse marginal branch.  The ramus intermedius is  large and bifurcates into 2 moderate size vessels. Luminal irregularities are noted..  The right coronary artery is totally occluded in the mid to distal segment. No collaterals are noted.  PCI RESULTS: Total occlusion in the mid to distal RCA was reduced to 0% with TIMI grade 3 flow. The area of total occlusion was focal. Distal to the stented region there is an eccentric 30-40% narrowing. The proximal and mid RCA also contained irregularities with up to 30-40% narrowing.  LEFT VENTRICULOGRAM: Left ventricular angiogram was done in the 30 RAO projection and revealed moderate inferobasal and mid inferior wall hypokinesis. Ejection fraction estimated at 50%  IMPRESSIONS: 1. Acute inferior wall ST elevation myocardial infarction complicated by ventricular fibrillation x2 in the emergency department requiring defibrillation on each instance.  2. Total occlusion of the mid/distal RCA was reduced to 0% with TIMI grade 3 flow after drug-eluting stent implantation and post dilatation of 3.25 mm .  3. The apical LAD contains an eccentric 50-70% stenosis. Otherwise there are luminal irregularities in both the LAD and circumflex/ramus proximal segments .  4. Moderate inferobasal and mid air for wall hypokinesis with preserved LVEF of 50%   Treatments: See Hospital Course  Discharge Exam: Blood pressure 104/68, pulse 75, temperature 99.7 F (37.6 C), temperature source Oral, resp. rate 16, height 6\' 5"  (1.956 m), weight 223 lb 14.4 oz (101.56 kg), SpO2 100.00%.   Disposition: 01-Home or Self Care      Discharge Orders   Future Appointments Provider Department Dept Phone   04/20/2013 9:50 AM Beatrice Lecher, PA-C [facility] [phone]   Future Orders Complete By Expires   Amb Referral to Cardiac Rehabilitation  As directed    Diet - low sodium heart healthy  As directed    Driving Restrictions  As directed    Comments:     No driving until follow-up appointment with cardiology     Increase activity slowly  As directed    Lifting restrictions  As directed    Comments:     No lifting more than 1/2 gallon of milk for 3 days       Medication List    STOP taking these medications       clindamycin 150 MG capsule  Commonly known as:  CLEOCIN     ibuprofen 200 MG tablet  Commonly known as:  ADVIL,MOTRIN      TAKE these medications       acetaminophen-codeine 300-30 MG per tablet  Commonly known as:  TYLENOL #3  Take 1 tablet by mouth every 4 (four) hours as needed for moderate pain.     amoxicillin-clavulanate 875-125 MG per tablet  Commonly known as:  AUGMENTIN  Take 1 tablet by mouth 2 (two) times daily.     aspirin 81 MG EC tablet  Take 1 tablet (81 mg total) by mouth daily.     atorvastatin 40 MG tablet  Commonly known as:  LIPITOR  Take 1 tablet (40 mg total) by mouth daily at  6 PM.     HYDROcodone-acetaminophen 5-325 MG per tablet  Commonly known as:  NORCO/VICODIN  Take 1 tablet by mouth every 4 (four) hours as needed.     lisinopril 2.5 MG tablet  Commonly known as:  PRINIVIL,ZESTRIL  Take 1 tablet (2.5 mg total) by mouth daily.     methylPREDNIsolone 4 MG tablet  Commonly known as:  MEDROL DOSPACK  Take 4 mg by mouth as directed. 6 day tapered course     metoprolol 50 MG tablet  Commonly known as:  LOPRESSOR  Take 1 tablet (50 mg total) by mouth 2 (two) times daily.     nitroGLYCERIN 0.4 MG SL tablet  Commonly known as:  NITROSTAT  Place 1 tablet (0.4 mg total) under the tongue every 5 (five) minutes x 3 doses as needed for chest pain.     penicillin v potassium 500 MG tablet  Commonly known as:  VEETID  Take 500 mg by mouth 3 (three) times daily.     Ticagrelor 90 MG Tabs tablet  Commonly known as:  BRILINTA  Take 1 tablet (90 mg total) by mouth 2 (two) times daily.     Ticagrelor 90 MG Tabs tablet  Commonly known as:  BRILINTA  Take 1 tablet (90 mg total) by mouth 2 (two) times daily.       Follow-up Information    Follow up with Tereso NewcomerScott Weaver, PA-C On 04/20/2013. (9:50 am )    Specialty:  Physician Assistant   Contact information:   [address] [place] [address] [phone]     TIME SPENT ON DISCHARGE, INCLUDING PHYSICIAN TIME: >30 MINUTES  Signed: Robbie LisSIMMONS, Scherry Laverne 04/05/2013, 2:39 PM

## 2013-04-05 NOTE — Progress Notes (Signed)
Subjective: No complaints. Denies CP, SOB, palpitations, lightheadedness, dizziness. Has been ambulating w/ cardiac rehab w/o difficulty.   Objective: Vital signs in last 24 hours: Temp:  [97.9 F (36.6 C)-99.7 F (37.6 C)] 99.7 F (37.6 C) (03/04 0526) Pulse Rate:  [68-80] 80 (03/04 0526) Resp:  [16-18] 16 (03/04 0526) BP: (121-145)/(72-87) 145/87 mmHg (03/04 0526) SpO2:  [99 %-100 %] 100 % (03/04 0526) Last BM Date: 04/04/13  Intake/Output from previous day:   Intake/Output this shift:    Medications Current Facility-Administered Medications  Medication Dose Route Frequency Provider Last Rate Last Dose  . 0.9 %  sodium chloride infusion  250 mL Intravenous PRN Leeann MustJacob Ercel Pepitone, MD      . amoxicillin-clavulanate (AUGMENTIN) 875-125 MG per tablet 1 tablet  1 tablet Oral Q12H Ok Anishristopher R Berge, NP   1 tablet at 04/04/13 2124  . aspirin EC tablet 81 mg  81 mg Oral Daily Leeann MustJacob Shavonne Ambroise, MD   81 mg at 04/04/13 1026  . atorvastatin (LIPITOR) tablet 80 mg  80 mg Oral q1800 Leeann MustJacob Braelynn Benning, MD   80 mg at 04/04/13 1733  . Chlorhexidine Gluconate Cloth 2 % PADS 6 each  6 each Topical Q0600 Lesleigh NoeHenry W Smith III, MD   6 each at 04/04/13 0600  . lisinopril (PRINIVIL,ZESTRIL) tablet 2.5 mg  2.5 mg Oral Daily Lennette Biharihomas A Taylr Meuth, MD   2.5 mg at 04/04/13 1338  . metoprolol tartrate (LOPRESSOR) tablet 25 mg  25 mg Oral BID Ok Anishristopher R Berge, NP   25 mg at 04/04/13 2124  . morphine 2 MG/ML injection 2-4 mg  2-4 mg Intravenous Q1H PRN Joline Salthonda G Barrett, PA-C   4 mg at 04/04/13 1338  . mupirocin ointment (BACTROBAN) 2 % 1 application  1 application Nasal BID Lesleigh NoeHenry W Smith III, MD   1 application at 04/04/13 2130  . nitroGLYCERIN (NITROSTAT) SL tablet 0.4 mg  0.4 mg Sublingual Q5 Min x 3 PRN Leeann MustJacob Mathis Cashman, MD      . ondansetron [facility](ZOFRAN) injection 4 mg  4 mg Intravenous Q8H PRN Brittainy Simmons, PA-C   4 mg at 04/04/13 1026  . oxyCODONE-acetaminophen (PERCOCET/ROXICET) 5-325 MG per tablet 1-2 tablet  1-2 tablet  Oral Q4H PRN Lesleigh NoeHenry W Smith III, MD   2 tablet at 04/03/13 1100  . sodium chloride 0.9 % injection 3 mL  3 mL Intravenous Q12H Leeann MustJacob Haylyn Halberg, MD   3 mL at 04/04/13 1032  . sodium chloride 0.9 % injection 3 mL  3 mL Intravenous PRN Leeann MustJacob Christophor Eick, MD      . Ticagrelor [facility](BRILINTA) tablet 90 mg  90 mg Oral BID Leeann MustJacob Joshua Zeringue, MD   90 mg at 04/04/13 2124    PE: General appearance: alert, cooperative and no distress Lungs: clear to auscultation bilaterally Heart: regular rate and rhythm and w/ ocassional PVCs Extremities: no LEE Pulses: 2+ and symmetric Skin: warm and dry Neurologic: Grossly normal  Lab Results:   Recent Labs  04/02/13 2035 04/03/13 0315  WBC 5.7 5.5  HGB 15.2 14.5  HCT 44.6 41.7  PLT 156 127*   BMET  Recent Labs  04/02/13 2325 04/03/13 0315 04/05/13 0433  NA 135* 137 143  K 5.4* 4.2 4.3  CL 101 102 103  CO2 22 25 27   GLUCOSE 116* 125* 83  BUN 13 12 19   CREATININE 0.89 0.80 1.19  CALCIUM 8.4 8.5 9.1   PT/INR  Recent Labs  04/02/13 2035  LABPROT 12.0  INR 0.90   Cholesterol  Recent  Labs  04/03/13 0315  CHOL 189    Assessment/Plan    Principal Problem:   ST elevation myocardial infarction (STEMI) of inferior wall Active Problems:   CAD (coronary artery disease)   Ventricular fibrillation   Cardiac arrest   Hyperlipidemia   Fever   Ventricular tachycardia  Plan: Day 3 s/p acute inferior STEMI/ VF arrest. S/p DES to RCA. No further CP or SOB. He has been ambulating w/ cardiac rehab w/o difficulty. Continue DAPT with ASA + Brilinta. Brilinta was approved by [employer] and he has a 30 day Brilinta card. He has had no further VT/ NSVT on telemetry the last 12 hrs. He denies palpitations and dizziness. He is on 25 mg of Lopressor BID. He does continue however to have occasional PVCs. There appears to be enough room with his BP and HR to further increase his Lopressor to 50 mg. SBP in the 140s and HR in the 90s. Will defer decision to MD. He was  noted to have elevated LFTs on admission with an AST of 116. This has improved to 65. It will likely be ok to resume his statin. Will likely d/c home today. MD to follow.     LOS: 3 days    Brittainy M. Delmer Islam 04/05/2013 9:40 AM    Patient seen and examined. Agree with assessment and plan. No recurrent chest pain.Feeling better. Tolerating lopressor without further NSVT. Will titrate to 50 mg bid post MI with ectopy. Titrate ACE-I as outpatient. Tooth and HA better with augmentin. OK for DC today.Resume atorvastatin at lower dose of 40 mg.   Lennette Bihari, MD, [facility] 04/05/2013 10:03 AM

## 2013-04-05 NOTE — Progress Notes (Signed)
D/c orders received;IV removed with gauze on, pt remains in stable condition, pt meds and instructions reviewed and given to pt; pt d/c to home 

## 2013-04-05 NOTE — Discharge Instructions (Signed)

## 2013-04-12 ENCOUNTER — Telehealth: Payer: Self-pay | Admitting: Interventional Cardiology

## 2013-04-12 NOTE — Telephone Encounter (Signed)
Walk In pt form " [employer]" Dropped gave to Lisa/Dr.smith

## 2013-04-13 ENCOUNTER — Telehealth: Payer: Self-pay | Admitting: Interventional Cardiology

## 2013-04-13 ENCOUNTER — Encounter: Payer: Self-pay | Admitting: *Deleted

## 2013-04-13 NOTE — Telephone Encounter (Signed)
Pt Aware Dr.Smith wil not Complete FMLA, I have left This at [employer] For pt to Come Pick up Pt Aware

## 2013-04-20 ENCOUNTER — Encounter: Payer: Self-pay | Admitting: Physician Assistant

## 2013-04-20 ENCOUNTER — Telehealth: Payer: Self-pay | Admitting: *Deleted

## 2013-04-20 ENCOUNTER — Ambulatory Visit (INDEPENDENT_AMBULATORY_CARE_PROVIDER_SITE_OTHER): Payer: BC Managed Care – PPO | Admitting: Physician Assistant

## 2013-04-20 ENCOUNTER — Encounter (HOSPITAL_COMMUNITY)
Admission: RE | Admit: 2013-04-20 | Discharge: 2013-04-20 | Disposition: A | Discharge status: Home / Self Care | Payer: BC Managed Care – PPO | Source: Ambulatory Visit | Attending: Interventional Cardiology | Admitting: Interventional Cardiology

## 2013-04-20 VITALS — BP 100/80 | HR 56 | Ht 73.0 in | Wt 224.0 lb

## 2013-04-20 DIAGNOSIS — I252 Old myocardial infarction: Secondary | ICD-10-CM | POA: Insufficient documentation

## 2013-04-20 DIAGNOSIS — R7989 Other specified abnormal findings of blood chemistry: Secondary | ICD-10-CM

## 2013-04-20 DIAGNOSIS — E785 Hyperlipidemia, unspecified: Secondary | ICD-10-CM

## 2013-04-20 DIAGNOSIS — R945 Abnormal results of liver function studies: Secondary | ICD-10-CM

## 2013-04-20 DIAGNOSIS — I2510 Atherosclerotic heart disease of native coronary artery without angina pectoris: Secondary | ICD-10-CM

## 2013-04-20 DIAGNOSIS — Z9861 Coronary angioplasty status: Secondary | ICD-10-CM | POA: Insufficient documentation

## 2013-04-20 DIAGNOSIS — Z5189 Encounter for other specified aftercare: Secondary | ICD-10-CM | POA: Insufficient documentation

## 2013-04-20 LAB — BASIC METABOLIC PANEL
BUN: 20 mg/dL (ref 6–23)
CALCIUM: 8.7 mg/dL (ref 8.4–10.5)
CO2: 31 meq/L (ref 19–32)
CREATININE: 1 mg/dL (ref 0.4–1.5)
Chloride: 104 mEq/L (ref 96–112)
GFR: 99.31 mL/min (ref >60.00)
GLUCOSE: 87 mg/dL (ref 70–99)
Potassium: 4 mEq/L (ref 3.5–5.1)
Sodium: 141 mEq/L (ref 135–145)

## 2013-04-20 LAB — HEPATIC FUNCTION PANEL
ALT: 63 U/L — ABNORMAL HIGH (ref 0–53)
AST: 35 U/L (ref 0–37)
Albumin: 3.9 g/dL (ref 3.5–5.2)
Alkaline Phosphatase: 54 U/L (ref 39–117)
Bilirubin, Direct: 0.1 mg/dL (ref 0.0–0.3)
Total Bilirubin: 0.6 mg/dL (ref 0.3–1.2)
Total Protein: 7.2 g/dL (ref 6.0–8.3)

## 2013-04-20 NOTE — Patient Instructions (Signed)
LAB WORK TODAY; BMET, LFT  YOU WILL NEED FASTING LIPID AND LIVER PANEL TO BE DONE IN 6 WEEKS  Your physician recommends that you schedule a follow-up appointment in: 6 WEEKS WITH DR. Katrinka Blazing  Your physician recommends that you continue on your current medications as directed. Please refer to the Current Medication list given to you today.

## 2013-04-20 NOTE — Progress Notes (Signed)
Cardiac Rehab Medication Review by a Pharmacist  Does the patient  feel that his/her medications are working for him/her?  He guesses. Says it is hard to tell  Has the patient been experiencing any side effects to the medications prescribed?  no  Does the patient measure his/her own blood pressure or blood glucose at home?  yes   Does the patient have any problems obtaining medications due to transportation or finances?   no  Understanding of regimen: good Understanding of indications: good Potential of compliance: excellent    Pharmacist comments: Patient was unsure exactly which acetaminophen product he has at home.  He states he rarely takes it for pain.  He knew the indications for each of the medications by looking at the bottles, but did not recognize the names immediately.  Thank you, Piedad Climes, PharmD Clinical Pharmacist - Resident Pager: [phone] Pharmacy: [phone] 04/20/2013 8:18 AM

## 2013-04-20 NOTE — Progress Notes (Signed)
[address] [place], [place]  [address] Phone: [phone]) [phone] Fax:  [phone]  Date:  04/20/2013   ID:  Mark Levine, DOB 10/02/1955, MRN [id number]  PCP:  Juline PatchPANG,RICHARD, MD  Cardiologist:  Dr. Verdis PrimeHenry Smith     History of Present Illness: Mark Levine is a 58 y.o. male with no prior past medical history. He was admitted 3/1-3/4 with an inferior STEMI. Course was complicated by ventricular fibrillation x2 treated with defibrillation each time. Emergent LHC demonstrated an occluded RCA which was treated with a Promus DES. He had residual disease in the apical LAD that was treated medically. Ejection fraction was preserved at 50%. He was placed on dual antiplatelet therapy, beta blocker, statin. He was placed on antibiotics prior to discharge for dental abscess.  Patient is doing well since discharge. His PCP recently decreased his metoprolol due to low blood pressures. Patient may feel lightheaded at times. However, he denies dizziness or syncope. He denies chest pain, dyspnea, orthopnea, PND or edema.  LHC (04/02/13): ostial left main 20-25, apical LAD 70, mid to distal RCA occluded, EF 50% inferior HK. PCI:  Promus premier (16 x 3 mm) DES to the RCA.   Recent Labs: 04/02/2013: Pro B Natriuretic peptide (BNP) 39.1; TSH 0.614  04/03/2013: HDL Cholesterol 45; Hemoglobin 14.5; LDL (calc) 107*  04/05/2013: ALT 34; Creatinine 1.19; Potassium 4.3   Wt Readings from Last 3 Encounters:  04/20/13 224 lb (101.606 kg)  04/20/13 227 lb 4.7 oz (103.1 kg)  04/04/13 223 lb 14.4 oz (101.56 kg)     Past Medical History  Diagnosis Date  . CAD (coronary artery disease)   . Hyperlipidemia     Current Outpatient Prescriptions  Medication Sig Dispense Refill  . acetaminophen-codeine (TYLENOL #3) 300-30 MG per tablet Take 1 tablet by mouth every 4 (four) hours as needed for moderate pain.      Marland Kitchen. aspirin EC 81 MG EC tablet Take 1 tablet (81 mg total) by mouth daily.      Marland Kitchen. atorvastatin  (LIPITOR) 40 MG tablet Take 1 tablet (40 mg total) by mouth daily at 6 PM.  1 tablet  5  . lisinopril (PRINIVIL,ZESTRIL) 2.5 MG tablet Take 1 tablet (2.5 mg total) by mouth daily.  30 tablet  5  . metoprolol (LOPRESSOR) 50 MG tablet Take 25 mg by mouth 2 (two) times daily.      . nitroGLYCERIN (NITROSTAT) 0.4 MG SL tablet Place 1 tablet (0.4 mg total) under the tongue every 5 (five) minutes x 3 doses as needed for chest pain.  25 tablet  2  . Ticagrelor (BRILINTA) 90 MG TABS tablet Take 1 tablet (90 mg total) by mouth 2 (two) times daily.  60 tablet  10   No current facility-administered medications for this visit.    Allergies:   Shellfish allergy   Social History:  The patient  reports that he has never smoked. He does not have any smokeless tobacco history on file. He reports that he does not drink alcohol or use illicit drugs.   Family History:  The patient's family history is not on file.   ROS:  Please see the history of present illness.      All other systems reviewed and negative.   PHYSICAL EXAM: VS:  BP 100/80  Pulse 56  Ht 6\' 1"  (1.854 m)  Wt 224 lb (101.606 kg)  BMI 29.56 kg/m2 Well nourished, well developed, in no acute distress HEENT: normal Neck: no  JVD Cardiac:  normal S1, S2; RRR; no murmur Lungs:  clear to auscultation bilaterally, no wheezing, rhonchi or rales Abd: soft, nontender, no hepatomegaly Ext: right wrist without hematoma or mass;  no edema Skin: warm and dry Neuro:  CNs 2-12 intact, no focal abnormalities noted  EKG:  Sinus bradycardia, HR 56, normal axis, T-wave inversions in 2, 3, aVF     ASSESSMENT AND PLAN:  1. CAD, Status Post Prior Inferior STEMI: He is doing well after recent STEMI treated with DES to the RCA. He has already enrolled in cardiac rehabilitation. Continue aspirin, Brilinta, lisinopril, metoprolol, statin. LFTs were somewhat elevated in the hospital. Arrange follow up LFTs today as well as basic metabolic panel. If his blood  pressure remains low, consider decreasing the metoprolol further to 12.5 mg twice a day. He has been cleared to go back to work at [employer] duty. I completed FMLA papers for him today. 2. Hyperlipidemia: Continue statin. Check lipids and LFTs in 6 weeks. 3. Disposition: Follow up with Dr. Katrinka Blazing in 6 weeks.  Signed, Tereso Newcomer, PA-C  04/20/2013 10:20 AM

## 2013-04-20 NOTE — Telephone Encounter (Signed)
pt notified about lab results and will have repeat LFT 4/3. Pt verbalized understanding to results.

## 2013-04-24 ENCOUNTER — Encounter (HOSPITAL_COMMUNITY)
Admission: RE | Admit: 2013-04-24 | Discharge: 2013-04-24 | Disposition: A | Discharge status: Home / Self Care | Payer: BC Managed Care – PPO | Source: Ambulatory Visit | Attending: Interventional Cardiology | Admitting: Interventional Cardiology

## 2013-04-24 NOTE — Progress Notes (Signed)
Pt started cardiac rehab today.  Pt tolerated light exercise without difficulty. Telemetry rhythm sinus rhythm with occasional PAC. Vital signs stable. Will continue to monitor the patient throughout  the program.

## 2013-04-26 ENCOUNTER — Encounter (HOSPITAL_COMMUNITY)
Admission: RE | Admit: 2013-04-26 | Discharge: 2013-04-26 | Disposition: A | Discharge status: Home / Self Care | Payer: BC Managed Care – PPO | Source: Ambulatory Visit | Attending: Interventional Cardiology | Admitting: Interventional Cardiology

## 2013-04-28 ENCOUNTER — Encounter (HOSPITAL_COMMUNITY)
Admission: RE | Admit: 2013-04-28 | Discharge: 2013-04-28 | Disposition: A | Discharge status: Home / Self Care | Payer: BC Managed Care – PPO | Source: Ambulatory Visit | Attending: Interventional Cardiology | Admitting: Interventional Cardiology

## 2013-05-01 ENCOUNTER — Encounter (HOSPITAL_COMMUNITY)
Admission: RE | Admit: 2013-05-01 | Discharge: 2013-05-01 | Disposition: A | Discharge status: Home / Self Care | Payer: BC Managed Care – PPO | Source: Ambulatory Visit | Attending: Interventional Cardiology | Admitting: Interventional Cardiology

## 2013-05-03 ENCOUNTER — Encounter (HOSPITAL_COMMUNITY)
Admission: RE | Admit: 2013-05-03 | Discharge: 2013-05-03 | Disposition: A | Discharge status: Home / Self Care | Payer: BC Managed Care – PPO | Source: Ambulatory Visit | Attending: Interventional Cardiology | Admitting: Interventional Cardiology

## 2013-05-03 DIAGNOSIS — I252 Old myocardial infarction: Secondary | ICD-10-CM | POA: Insufficient documentation

## 2013-05-03 DIAGNOSIS — Z9861 Coronary angioplasty status: Secondary | ICD-10-CM | POA: Insufficient documentation

## 2013-05-03 DIAGNOSIS — Z5189 Encounter for other specified aftercare: Secondary | ICD-10-CM | POA: Insufficient documentation

## 2013-05-03 NOTE — Progress Notes (Signed)
Reviewed home exercise with pt today.  Pt plans to continue walking and to use the bike and elliptical at home for exercise.  Reviewed THR, pulse, RPE, sign and symptoms, NTG use, and when to call 911 or MD.  Pt voiced understanding. Fabio Pierce, MA, ACSM RCEP

## 2013-05-05 ENCOUNTER — Encounter (HOSPITAL_COMMUNITY)
Admission: RE | Admit: 2013-05-05 | Discharge: 2013-05-05 | Disposition: A | Discharge status: Home / Self Care | Payer: BC Managed Care – PPO | Source: Ambulatory Visit | Attending: Interventional Cardiology | Admitting: Interventional Cardiology

## 2013-05-05 ENCOUNTER — Other Ambulatory Visit: Payer: Self-pay | Admitting: Physician Assistant

## 2013-05-05 ENCOUNTER — Ambulatory Visit (INDEPENDENT_AMBULATORY_CARE_PROVIDER_SITE_OTHER): Payer: BC Managed Care – PPO | Admitting: *Deleted

## 2013-05-05 DIAGNOSIS — R945 Abnormal results of liver function studies: Secondary | ICD-10-CM

## 2013-05-05 DIAGNOSIS — R7989 Other specified abnormal findings of blood chemistry: Secondary | ICD-10-CM

## 2013-05-05 DIAGNOSIS — E785 Hyperlipidemia, unspecified: Secondary | ICD-10-CM

## 2013-05-06 LAB — HEPATIC FUNCTION PANEL
ALBUMIN: 4.3 g/dL (ref 3.5–5.2)
ALK PHOS: 58 U/L (ref 39–117)
ALT: 46 U/L (ref 0–53)
AST: 36 U/L (ref 0–37)
Bilirubin, Direct: 0.2 mg/dL (ref 0.0–0.3)
Indirect Bilirubin: 0.7 mg/dL (ref 0.2–1.2)
Total Bilirubin: 0.9 mg/dL (ref 0.2–1.2)
Total Protein: 7.7 g/dL (ref 6.0–8.3)

## 2013-05-08 ENCOUNTER — Encounter (HOSPITAL_COMMUNITY)
Admission: RE | Admit: 2013-05-08 | Discharge: 2013-05-08 | Disposition: A | Discharge status: Home / Self Care | Payer: BC Managed Care – PPO | Source: Ambulatory Visit | Attending: Interventional Cardiology | Admitting: Interventional Cardiology

## 2013-05-09 ENCOUNTER — Encounter: Payer: Self-pay | Admitting: *Deleted

## 2013-05-09 ENCOUNTER — Telehealth: Payer: Self-pay | Admitting: *Deleted

## 2013-05-09 LAB — LIPID PANEL
Cholesterol: 101 mg/dL (ref 0–200)
HDL: 42 mg/dL (ref >39)
LDL Cholesterol: 52 mg/dL (ref 0–99)
TRIGLYCERIDES: 33 mg/dL (ref <150)
Total CHOL/HDL Ratio: 2.4 Ratio
VLDL: 7 mg/dL (ref 0–40)

## 2013-05-09 NOTE — Telephone Encounter (Signed)
lmptcb for lab results 

## 2013-05-09 NOTE — Telephone Encounter (Signed)
lmptcb x 2 for lab results 

## 2013-05-10 ENCOUNTER — Encounter (HOSPITAL_COMMUNITY)
Admission: RE | Admit: 2013-05-10 | Discharge: 2013-05-10 | Disposition: A | Discharge status: Home / Self Care | Payer: BC Managed Care – PPO | Source: Ambulatory Visit | Attending: Interventional Cardiology | Admitting: Interventional Cardiology

## 2013-05-10 ENCOUNTER — Encounter: Payer: Self-pay | Admitting: *Deleted

## 2013-05-10 ENCOUNTER — Telehealth: Payer: Self-pay | Admitting: *Deleted

## 2013-05-10 NOTE — Telephone Encounter (Signed)
lmom labs ok, will send out result letter for the pt

## 2013-05-12 ENCOUNTER — Encounter (HOSPITAL_COMMUNITY)
Admission: RE | Admit: 2013-05-12 | Discharge: 2013-05-12 | Disposition: A | Discharge status: Home / Self Care | Payer: BC Managed Care – PPO | Source: Ambulatory Visit | Attending: Interventional Cardiology | Admitting: Interventional Cardiology

## 2013-05-15 ENCOUNTER — Encounter (HOSPITAL_COMMUNITY)
Admission: RE | Admit: 2013-05-15 | Discharge: 2013-05-15 | Disposition: A | Discharge status: Home / Self Care | Payer: BC Managed Care – PPO | Source: Ambulatory Visit | Attending: Interventional Cardiology | Admitting: Interventional Cardiology

## 2013-05-15 NOTE — Progress Notes (Signed)
Mark Levine 58 y.o. male Nutrition Note Spoke with pt.  Nutrition Survey reviewed with pt. Pt has little room for improvement. Pt is following Step 2 of the Therapeutic Lifestyle Changes diet. Pt wants to lose wt. Pt has been trying to lose wt by exercising and improving on choosing more healthy food choices. Pt reports decreasing his saturated fat and trans fat intake by avoiding greasy and fried foods, eating leaner cuts of meat, and choosing healthy cooking techniques when making food. Pt was educated on the importance of monitoring his food/ meal serving sizes as he reports that is one area of improvement for him that he is working on. Pt questioned about nuts and its health contents. Pt's questions were appropriately answered. Pt has been screened for pre-diabetes. Pt was educated on what pre-diabetes is and was encouraged to continue to exercise, lose weight, eat healthy, and follow up with his health care provider about his HbA1c annually. Pt expressed understanding of the information reviewed. Pt aware of nutrition education classes offered and is unable to attend nutrition classes. Pt was given a handout of the information that will be covered during the classes.   Nutrition Diagnosis   Food-and nutrition-related knowledge deficit related to lack of exposure to information as related to diagnosis of: ? CVD ? Pre-DM (A1c 5.7) ?    Obesity related to excessive energy intake as evidenced by a BMI of 30  Nutrition Intervention   Benefits of adopting Therapeutic Lifestyle Changes discussed when Medficts reviewed.   Pt to attend the Portion Distortion class   Pt given handouts for: ? Nutrition I class ? Nutrition II class ? Eating out heart healthy   Continue client-centered nutrition education by RD, as part of interdisciplinary care.  Goal(s)   Pt to identify food quantities necessary to achieve: ? wt loss to a goal wt of 202-220 lb (92-100 kg) at graduation from cardiac rehab.    Pt to  describe the benefit of including fruits, vegetables, whole grains, and low-fat dairy products in a heart healthy meal plan.  Monitor and Evaluate progress toward nutrition goal with team. Nutrition Risk:  Low   Marijean Niemann Dietetic Intern  05/15/2013 3:33 PM Mickle Plumb, M.Ed, RD, LDN, CDE 05/17/2013 9:00 AM

## 2013-05-17 ENCOUNTER — Encounter (HOSPITAL_COMMUNITY)
Admission: RE | Admit: 2013-05-17 | Discharge: 2013-05-17 | Disposition: A | Discharge status: Home / Self Care | Payer: BC Managed Care – PPO | Source: Ambulatory Visit | Attending: Interventional Cardiology | Admitting: Interventional Cardiology

## 2013-05-19 ENCOUNTER — Encounter (HOSPITAL_COMMUNITY)
Admission: RE | Admit: 2013-05-19 | Discharge: 2013-05-19 | Disposition: A | Discharge status: Home / Self Care | Payer: BC Managed Care – PPO | Source: Ambulatory Visit | Attending: Interventional Cardiology | Admitting: Interventional Cardiology

## 2013-05-22 ENCOUNTER — Encounter (HOSPITAL_COMMUNITY)
Admission: RE | Admit: 2013-05-22 | Discharge: 2013-05-22 | Disposition: A | Discharge status: Home / Self Care | Payer: BC Managed Care – PPO | Source: Ambulatory Visit | Attending: Interventional Cardiology | Admitting: Interventional Cardiology

## 2013-05-24 ENCOUNTER — Encounter (HOSPITAL_COMMUNITY)
Admission: RE | Admit: 2013-05-24 | Discharge: 2013-05-24 | Disposition: A | Discharge status: Home / Self Care | Payer: BC Managed Care – PPO | Source: Ambulatory Visit | Attending: Interventional Cardiology | Admitting: Interventional Cardiology

## 2013-05-26 ENCOUNTER — Encounter (HOSPITAL_COMMUNITY)
Admission: RE | Admit: 2013-05-26 | Discharge: 2013-05-26 | Disposition: A | Discharge status: Home / Self Care | Payer: BC Managed Care – PPO | Source: Ambulatory Visit | Attending: Interventional Cardiology | Admitting: Interventional Cardiology

## 2013-05-26 NOTE — Progress Notes (Signed)
Mark Levine has an appointment with Dr Katrinka Blazing on Monday.  Will fax exercise flow sheets to Dr. Michaelle Copas office for review

## 2013-05-29 ENCOUNTER — Encounter (INDEPENDENT_AMBULATORY_CARE_PROVIDER_SITE_OTHER): Payer: Self-pay

## 2013-05-29 ENCOUNTER — Ambulatory Visit (INDEPENDENT_AMBULATORY_CARE_PROVIDER_SITE_OTHER): Payer: BC Managed Care – PPO | Admitting: Interventional Cardiology

## 2013-05-29 ENCOUNTER — Encounter (HOSPITAL_COMMUNITY)
Admission: RE | Admit: 2013-05-29 | Discharge: 2013-05-29 | Disposition: A | Discharge status: Home / Self Care | Payer: BC Managed Care – PPO | Source: Ambulatory Visit | Attending: Interventional Cardiology | Admitting: Interventional Cardiology

## 2013-05-29 ENCOUNTER — Encounter: Payer: Self-pay | Admitting: Interventional Cardiology

## 2013-05-29 ENCOUNTER — Telehealth: Payer: Self-pay

## 2013-05-29 ENCOUNTER — Other Ambulatory Visit: Payer: BC Managed Care – PPO

## 2013-05-29 VITALS — BP 114/74 | HR 56 | Ht 73.0 in | Wt 218.0 lb

## 2013-05-29 DIAGNOSIS — I252 Old myocardial infarction: Secondary | ICD-10-CM

## 2013-05-29 DIAGNOSIS — E785 Hyperlipidemia, unspecified: Secondary | ICD-10-CM

## 2013-05-29 DIAGNOSIS — I2510 Atherosclerotic heart disease of native coronary artery without angina pectoris: Secondary | ICD-10-CM

## 2013-05-29 NOTE — Patient Instructions (Signed)
Your physician recommends that you continue on your current medications as directed. Please refer to the Current Medication list given to you today.  Your physician wants you to follow-up in: 4 months You will receive a reminder letter in the mail two months in advance. If you don't receive a letter, please call our office to schedule the follow-up appointment.  Your physician recommends that you return for a FASTING lipid profile and alt in 4 months

## 2013-05-29 NOTE — Progress Notes (Signed)
Patient ID: Mark Levine, male   DOB: [birth year], 58 y.o.   MRN: [id number]    [address] [place], [place]  [address] Phone: [phone] Fax:  [phone]  Date:  05/29/2013   ID:  Mark Levine, DOB April 14, 1955, MRN [id number]  PCP:  Mark Patch, MD   ASSESSMENT:  1. status post acute inferior myocardial infarction complicated by ventricular fibrillation, successfully resuscitated 04/02/2013 2. Coronary artery disease with right coronary stent implanted at the time of acute myocardial infarction 04/2013 3. Hyperlipidemia with LDL cholesterol of 52 when checked 2 weeks ago  PLAN:  1. Liberalize his heart rate at cardiac rehabilitation 2. Recheck lipid panel in 4 months 3. No change in the current medical regimen.   SUBJECTIVE: Mark Levine is a 58 y.o. male who had an inferior infarction on March 1. In the emergency room he developed ventricular fibrillation and was successfully resuscitated. He was treated as an acute ST elevation myocardial infarction and received a right coronary stent. He's had no complications since the initial presentation. He is now back fully at work at [employer]. He denies chest discomfort, palpitations, syncope, and medication side effects to   Wt Readings from Last 3 Encounters:  05/29/13 218 lb (98.884 kg)  04/20/13 224 lb (101.606 kg)  04/20/13 227 lb 4.7 oz (103.1 kg)     Past Medical History  Diagnosis Date  . CAD (coronary artery disease)   . Hyperlipidemia     Current Outpatient Prescriptions  Medication Sig Dispense Refill  . acetaminophen-codeine (TYLENOL #3) 300-30 MG per tablet Take 1 tablet by mouth every 4 (four) hours as needed for moderate pain.      Marland Kitchen aspirin EC 81 MG EC tablet Take 1 tablet (81 mg total) by mouth daily.      Marland Kitchen atorvastatin (LIPITOR) 40 MG tablet Take 1 tablet (40 mg total) by mouth daily at 6 PM.  1 tablet  5  . lisinopril (PRINIVIL,ZESTRIL) 2.5 MG tablet Take 1 tablet (2.5 mg total) by mouth  daily.  30 tablet  5  . metoprolol (LOPRESSOR) 50 MG tablet Take 25 mg by mouth 2 (two) times daily.      . nitroGLYCERIN (NITROSTAT) 0.4 MG SL tablet Place 1 tablet (0.4 mg total) under the tongue every 5 (five) minutes x 3 doses as needed for chest pain.  25 tablet  2  . Ticagrelor (BRILINTA) 90 MG TABS tablet Take 1 tablet (90 mg total) by mouth 2 (two) times daily.  60 tablet  10   No current facility-administered medications for this visit.    Allergies:    Allergies  Allergen Reactions  . Shellfish Allergy Hives    Social History:  The patient  reports that he has never smoked. He does not have any smokeless tobacco history on file. He reports that he does not drink alcohol or use illicit drugs.   ROS:  Please see the history of present illness.   Denies claudication. Cath site is well healed.   All other systems reviewed and negative.   OBJECTIVE: VS:  BP 114/74  Pulse 56  Ht 6\' 1"  (1.854 m)  Wt 218 lb (98.884 kg)  BMI 28.77 kg/m2 Well nourished, well developed, in no acute distress, compatible with age HEENT: normal Neck: JVD flat. Carotid bruit absent  Cardiac:  normal S1, S2; RRR; no murmur Lungs:  clear to auscultation bilaterally, no wheezing, rhonchi or rales Abd: soft, nontender, no hepatomegaly Ext: Edema  absent. Pulses 2+ bilateral Skin: warm and dry Neuro:  CNs 2-12 intact, no focal abnormalities noted  EKG:  Not repeated       Signed, Darci NeedleHenry W. B. Dennard Vezina III, MD 05/29/2013 12:40 PM

## 2013-05-29 NOTE — Telephone Encounter (Signed)
Per Dr.Smith pt heart rate target at cardiac rehab should be increased. Not to exceed 142bpm.routhed to Fabio Pierce at [facility] cardiac rehab

## 2013-05-31 ENCOUNTER — Encounter (HOSPITAL_COMMUNITY)
Admission: RE | Admit: 2013-05-31 | Discharge: 2013-05-31 | Disposition: A | Discharge status: Home / Self Care | Payer: BC Managed Care – PPO | Source: Ambulatory Visit | Attending: Interventional Cardiology | Admitting: Interventional Cardiology

## 2013-06-02 ENCOUNTER — Encounter (HOSPITAL_COMMUNITY)
Admission: RE | Admit: 2013-06-02 | Discharge: 2013-06-02 | Disposition: A | Discharge status: Home / Self Care | Payer: BC Managed Care – PPO | Source: Ambulatory Visit | Attending: Interventional Cardiology | Admitting: Interventional Cardiology

## 2013-06-02 DIAGNOSIS — Z5189 Encounter for other specified aftercare: Secondary | ICD-10-CM | POA: Insufficient documentation

## 2013-06-02 DIAGNOSIS — Z9861 Coronary angioplasty status: Secondary | ICD-10-CM | POA: Insufficient documentation

## 2013-06-02 DIAGNOSIS — I252 Old myocardial infarction: Secondary | ICD-10-CM | POA: Insufficient documentation

## 2013-06-05 ENCOUNTER — Encounter (HOSPITAL_COMMUNITY)
Admission: RE | Admit: 2013-06-05 | Discharge: 2013-06-05 | Disposition: A | Discharge status: Home / Self Care | Payer: BC Managed Care – PPO | Source: Ambulatory Visit | Attending: Interventional Cardiology | Admitting: Interventional Cardiology

## 2013-06-07 ENCOUNTER — Encounter (HOSPITAL_COMMUNITY)
Admission: RE | Admit: 2013-06-07 | Discharge: 2013-06-07 | Disposition: A | Discharge status: Home / Self Care | Payer: BC Managed Care – PPO | Source: Ambulatory Visit | Attending: Interventional Cardiology | Admitting: Interventional Cardiology

## 2013-06-09 ENCOUNTER — Encounter (HOSPITAL_COMMUNITY)
Admission: RE | Admit: 2013-06-09 | Discharge: 2013-06-09 | Disposition: A | Discharge status: Home / Self Care | Payer: BC Managed Care – PPO | Source: Ambulatory Visit | Attending: Interventional Cardiology | Admitting: Interventional Cardiology

## 2013-06-12 ENCOUNTER — Encounter (HOSPITAL_COMMUNITY)
Admission: RE | Admit: 2013-06-12 | Discharge: 2013-06-12 | Disposition: A | Discharge status: Home / Self Care | Payer: BC Managed Care – PPO | Source: Ambulatory Visit | Attending: Interventional Cardiology | Admitting: Interventional Cardiology

## 2013-06-14 ENCOUNTER — Encounter (HOSPITAL_COMMUNITY)
Admission: RE | Admit: 2013-06-14 | Discharge: 2013-06-14 | Disposition: A | Discharge status: Home / Self Care | Payer: BC Managed Care – PPO | Source: Ambulatory Visit | Attending: Interventional Cardiology | Admitting: Interventional Cardiology

## 2013-06-16 ENCOUNTER — Encounter (HOSPITAL_COMMUNITY)
Admission: RE | Admit: 2013-06-16 | Discharge: 2013-06-16 | Disposition: A | Discharge status: Home / Self Care | Payer: BC Managed Care – PPO | Source: Ambulatory Visit | Attending: Interventional Cardiology | Admitting: Interventional Cardiology

## 2013-06-19 ENCOUNTER — Encounter (HOSPITAL_COMMUNITY)
Admission: RE | Admit: 2013-06-19 | Discharge: 2013-06-19 | Disposition: A | Discharge status: Home / Self Care | Payer: BC Managed Care – PPO | Source: Ambulatory Visit | Attending: Interventional Cardiology | Admitting: Interventional Cardiology

## 2013-06-21 ENCOUNTER — Encounter: Payer: Self-pay | Admitting: Interventional Cardiology

## 2013-06-21 ENCOUNTER — Encounter (HOSPITAL_COMMUNITY)
Admission: RE | Admit: 2013-06-21 | Discharge: 2013-06-21 | Disposition: A | Discharge status: Home / Self Care | Payer: BC Managed Care – PPO | Source: Ambulatory Visit | Attending: Interventional Cardiology | Admitting: Interventional Cardiology

## 2013-06-21 NOTE — Progress Notes (Signed)
Post exercise blood pressure 96/64. Patient asymptomatic.  Patient given Gatorade recheck blood pressure 114/8. Mark Levine said he fasted yesterday evening for some morning lab work and may not have drank enough water today. Will continue to monitor the patient throughout  the program.

## 2013-06-23 ENCOUNTER — Encounter (HOSPITAL_COMMUNITY)
Admission: RE | Admit: 2013-06-23 | Discharge: 2013-06-23 | Disposition: A | Discharge status: Home / Self Care | Payer: BC Managed Care – PPO | Source: Ambulatory Visit | Attending: Interventional Cardiology | Admitting: Interventional Cardiology

## 2013-06-28 ENCOUNTER — Encounter (HOSPITAL_COMMUNITY)
Admission: RE | Admit: 2013-06-28 | Discharge: 2013-06-28 | Disposition: A | Discharge status: Home / Self Care | Payer: BC Managed Care – PPO | Source: Ambulatory Visit | Attending: Interventional Cardiology | Admitting: Interventional Cardiology

## 2013-06-28 NOTE — Progress Notes (Signed)
Patient noted to have an occasional PAC with a questionable nonconductive fusion beat? Patient  Asymptomatic vital signs stable today. Will fax exercise flow sheets to Dr. Michaelle Copas  office for review with today's EKG tracing. Will continue to monitor the patient throughout  the program. Mark Levine has a history of PAC's.

## 2013-06-29 ENCOUNTER — Telehealth: Payer: Self-pay | Admitting: Interventional Cardiology

## 2013-06-29 NOTE — Telephone Encounter (Signed)
New problem    Pt want doctor to know if  [place] Please advise

## 2013-06-29 NOTE — Telephone Encounter (Signed)
returned call lmtcb 

## 2013-06-30 ENCOUNTER — Encounter (HOSPITAL_COMMUNITY)
Admission: RE | Admit: 2013-06-30 | Discharge: 2013-06-30 | Disposition: A | Discharge status: Home / Self Care | Payer: BC Managed Care – PPO | Source: Ambulatory Visit | Attending: Interventional Cardiology | Admitting: Interventional Cardiology

## 2013-07-03 ENCOUNTER — Encounter (HOSPITAL_COMMUNITY): Payer: BC Managed Care – PPO

## 2013-07-05 ENCOUNTER — Encounter (HOSPITAL_COMMUNITY): Payer: BC Managed Care – PPO

## 2013-07-07 ENCOUNTER — Encounter (HOSPITAL_COMMUNITY): Payer: BC Managed Care – PPO

## 2013-07-10 ENCOUNTER — Encounter (HOSPITAL_COMMUNITY)
Admission: RE | Admit: 2013-07-10 | Discharge: 2013-07-10 | Disposition: A | Discharge status: Home / Self Care | Payer: BC Managed Care – PPO | Source: Ambulatory Visit | Attending: Interventional Cardiology | Admitting: Interventional Cardiology

## 2013-07-10 DIAGNOSIS — Z5189 Encounter for other specified aftercare: Secondary | ICD-10-CM | POA: Insufficient documentation

## 2013-07-10 DIAGNOSIS — Z9861 Coronary angioplasty status: Secondary | ICD-10-CM | POA: Insufficient documentation

## 2013-07-10 DIAGNOSIS — I252 Old myocardial infarction: Secondary | ICD-10-CM | POA: Insufficient documentation

## 2013-07-12 ENCOUNTER — Encounter (HOSPITAL_COMMUNITY)
Admission: RE | Admit: 2013-07-12 | Discharge: 2013-07-12 | Disposition: A | Discharge status: Home / Self Care | Payer: BC Managed Care – PPO | Source: Ambulatory Visit | Attending: Interventional Cardiology | Admitting: Interventional Cardiology

## 2013-07-14 ENCOUNTER — Encounter (HOSPITAL_COMMUNITY)
Admission: RE | Admit: 2013-07-14 | Discharge: 2013-07-14 | Disposition: A | Discharge status: Home / Self Care | Payer: BC Managed Care – PPO | Source: Ambulatory Visit | Attending: Interventional Cardiology | Admitting: Interventional Cardiology

## 2013-07-17 ENCOUNTER — Encounter (HOSPITAL_COMMUNITY)
Admission: RE | Admit: 2013-07-17 | Discharge: 2013-07-17 | Disposition: A | Discharge status: Home / Self Care | Payer: BC Managed Care – PPO | Source: Ambulatory Visit | Attending: Interventional Cardiology | Admitting: Interventional Cardiology

## 2013-07-19 ENCOUNTER — Encounter (HOSPITAL_COMMUNITY)
Admission: RE | Admit: 2013-07-19 | Discharge: 2013-07-19 | Disposition: A | Discharge status: Home / Self Care | Payer: BC Managed Care – PPO | Source: Ambulatory Visit | Attending: Interventional Cardiology | Admitting: Interventional Cardiology

## 2013-07-21 ENCOUNTER — Encounter (HOSPITAL_COMMUNITY)
Admission: RE | Admit: 2013-07-21 | Discharge: 2013-07-21 | Disposition: A | Discharge status: Home / Self Care | Payer: BC Managed Care – PPO | Source: Ambulatory Visit | Attending: Interventional Cardiology | Admitting: Interventional Cardiology

## 2013-07-21 NOTE — Progress Notes (Signed)
[employer] on Monday. Patient congratulated on his weight loss while in the program!  Chau plans to continue exercise on his own he has an elliptical and a recumbent bike at home.

## 2013-07-24 ENCOUNTER — Encounter (HOSPITAL_COMMUNITY)
Admission: RE | Admit: 2013-07-24 | Discharge: 2013-07-24 | Disposition: A | Discharge status: Home / Self Care | Payer: BC Managed Care – PPO | Source: Ambulatory Visit | Attending: Interventional Cardiology | Admitting: Interventional Cardiology

## 2013-07-24 ENCOUNTER — Encounter (HOSPITAL_COMMUNITY): Payer: Self-pay

## 2013-07-24 NOTE — Progress Notes (Signed)
Pt graduated from cardiac rehab program today.  Medication list reconciled.  PHQ9 score-0. Pt plans to continue exercising on his own with home equipment.  Pt feels he has adequately met his rehab goals of weight loss.

## 2013-07-26 ENCOUNTER — Encounter (HOSPITAL_COMMUNITY): Payer: BC Managed Care – PPO

## 2013-07-28 ENCOUNTER — Encounter (HOSPITAL_COMMUNITY): Payer: BC Managed Care – PPO

## 2013-09-20 ENCOUNTER — Other Ambulatory Visit (HOSPITAL_COMMUNITY): Payer: Self-pay | Admitting: Cardiology

## 2013-09-29 ENCOUNTER — Telehealth: Payer: Self-pay | Admitting: Interventional Cardiology

## 2013-09-29 NOTE — Telephone Encounter (Signed)
Pt states last 2 days he feels a little discomfort in his upper chest/back. Pt states he has lifted some wood recently.  Pt states he gets the pain when he moves his arms or turns his chest.  Pt states this is not anything like pain at time of MI, he denies any other symptoms including  SOB, nausea, lightheadedness or diaphoresis.

## 2013-09-29 NOTE — Telephone Encounter (Signed)
New Message  Pt called states that when he turns his torso there is a slight pain or discomfort in his chest// Pt is requesting a call back to discuss.

## 2013-09-29 NOTE — Telephone Encounter (Signed)
Recommended pt try tylenol prn.  Pt advised to seek medical care if symptoms change at all or do not resolve. Reviewed with Dr Katrinka Blazing, he agreed with this plan.

## 2013-10-11 ENCOUNTER — Encounter: Payer: Self-pay | Admitting: Interventional Cardiology

## 2013-10-11 ENCOUNTER — Ambulatory Visit (INDEPENDENT_AMBULATORY_CARE_PROVIDER_SITE_OTHER): Payer: BC Managed Care – PPO | Admitting: Interventional Cardiology

## 2013-10-11 VITALS — BP 116/64 | HR 62 | Ht 72.0 in | Wt 205.0 lb

## 2013-10-11 DIAGNOSIS — N529 Male erectile dysfunction, unspecified: Secondary | ICD-10-CM

## 2013-10-11 DIAGNOSIS — E785 Hyperlipidemia, unspecified: Secondary | ICD-10-CM

## 2013-10-11 DIAGNOSIS — I2510 Atherosclerotic heart disease of native coronary artery without angina pectoris: Secondary | ICD-10-CM

## 2013-10-11 HISTORY — DX: Male erectile dysfunction, unspecified: N52.9

## 2013-10-11 NOTE — Progress Notes (Signed)
Patient ID: Mark Levine, male   DOB: 1955-03-01, 58 y.o.   MRN: [id number]    [address] [place], [place]  [address] Phone: [phone] Fax:  [phone]  Date:  10/11/2013   ID:  KUNAAL WALKINS, DOB 22-Dec-1955, MRN [id number]  PCP:  Juline Patch, MD   ASSESSMENT:  1. CAD with prior inferior MI complicated by ventricular fibrillation. Patient had right coronary stent and is now doing well without recurrent angina or cardiac symptoms 2. Hyperlipidemia 3. Erectile dysfunction  PLAN:  1. clinical followup in 6 months 2. It would be safe for the patient to use PDE- 5 inhibitor therapy. I counseled him concerning the interaction between nitrates and PDE 5 3. Fasting lipid panel in 6 months   SUBJECTIVE: Mark Levine is a 58 y.o. male who is doing well. He has not had any recurrence of angina. He denies syncope. No palpitations. He has some concern that his erectile function is not as strong as it once was. He was on any medication prior to his myocardial infarction. He is playing golf and had been no limitations.   Wt Readings from Last 3 Encounters:  10/11/13 205 lb (92.987 kg)  05/29/13 218 lb (98.884 kg)  04/20/13 224 lb (101.606 kg)     Past Medical History  Diagnosis Date  . CAD (coronary artery disease)   . Hyperlipidemia     Current Outpatient Prescriptions  Medication Sig Dispense Refill  . acetaminophen-codeine (TYLENOL #3) 300-30 MG per tablet Take 1 tablet by mouth every 4 (four) hours as needed for moderate pain.      Marland Kitchen aspirin EC 81 MG EC tablet Take 1 tablet (81 mg total) by mouth daily.      Marland Kitchen atorvastatin (LIPITOR) 40 MG tablet TAKE 1 TABLET BY MOUTH DAILY AT 6:00PM  30 tablet  5  . lisinopril (PRINIVIL,ZESTRIL) 2.5 MG tablet TAKE 1 TABLET EVERY DAY  30 tablet  5  . metoprolol (LOPRESSOR) 50 MG tablet Take 25 mg by mouth 2 (two) times daily.      . nitroGLYCERIN (NITROSTAT) 0.4 MG SL tablet Place 1 tablet (0.4 mg total) under the tongue  every 5 (five) minutes x 3 doses as needed for chest pain.  25 tablet  2  . Ticagrelor (BRILINTA) 90 MG TABS tablet Take 1 tablet (90 mg total) by mouth 2 (two) times daily.  60 tablet  10   No current facility-administered medications for this visit.    Allergies:    Allergies  Allergen Reactions  . Shellfish Allergy Hives    Social History:  The patient  reports that he has never smoked. He does not have any smokeless tobacco history on file. He reports that he does not drink alcohol or use illicit drugs.   ROS:  Please see the history of present illness.   Has used Viagra in the past.   All other systems reviewed and negative.   OBJECTIVE: VS:  BP 116/64  Pulse 62  Ht 6' (1.829 m)  Wt 205 lb (92.987 kg)  BMI 27.80 kg/m2 Well nourished, well developed, in no acute distress, healthy appearing HEENT: normal Neck: JVD flat. Carotid bruit absent  Cardiac:  normal S1, S2; RRR; no murmur Lungs:  clear to auscultation bilaterally, no wheezing, rhonchi or rales Abd: soft, nontender, no hepatomegaly Ext: Edema absent. Pulses 2+ and symmetric Skin: warm and dry Neuro:  CNs 2-12 intact, no focal abnormalities noted  EKG:  Not repeated  Signed, Darci Needle III, MD 10/11/2013 3:37 PM

## 2013-10-11 NOTE — Patient Instructions (Addendum)
Your physician recommends that you continue on your current medications as directed. Please refer to the Current Medication list given to you today.  Your physician recommends that you return for a FASTING lipid profile and alt in 6 months  Your physician wants you to follow-up in: 6 months with Dr.Smith You will receive a reminder letter in the mail two months in advance. If you don't receive a letter, please call our office to schedule the follow-up appointment.

## 2013-10-25 ENCOUNTER — Encounter: Payer: Self-pay | Admitting: Interventional Cardiology

## 2013-10-27 ENCOUNTER — Telehealth: Payer: Self-pay

## 2013-10-27 NOTE — Telephone Encounter (Signed)
Message copied by Jarvis Newcomer on Fri Oct 27, 2013  3:22 PM ------      Message from: Verdis Prime      Created: Thu Oct 26, 2013  6:55 PM       Good. ------

## 2013-10-27 NOTE — Telephone Encounter (Signed)
lmom lab drawn by home health nurse was ok.

## 2013-11-24 ENCOUNTER — Telehealth: Payer: Self-pay | Admitting: Interventional Cardiology

## 2013-11-24 NOTE — Telephone Encounter (Signed)
Routed to Dr.Smith to advise 

## 2013-11-24 NOTE — Telephone Encounter (Signed)
New message      Can pt take fish oil with his other medications?  Do you have a special kind you recommend?

## 2013-11-27 NOTE — Telephone Encounter (Signed)
Follow Up   Pt called requests a call back to discuss if he can take the fish oil. Please call

## 2013-11-28 NOTE — Telephone Encounter (Signed)
Returned pt call.adv pt that a message has been routed to Dr.Smith and I am waiting on his response

## 2013-11-28 NOTE — Telephone Encounter (Signed)
°  Patient wants to know about taking fish oil, please call back.

## 2013-11-29 NOTE — Telephone Encounter (Signed)
Follow up     Patient wants to know what's the communication break down on his previous message .read message to patient below on step from nurse. Would like a call back  .

## 2013-11-29 NOTE — Telephone Encounter (Signed)
pt called in today upset that he has not heard back if Dr.Smith thinks it is ok for him to take fish oil. adv pt that Dr.Smith has been out of the office. adv pt that are office pharmacist thinks it would be fine, adv pt that Dr.Smith will be back in the office on 10/30. I will talk with him and call back with his recommendations.pr verbalized understanding.

## 2013-12-01 NOTE — Telephone Encounter (Signed)
pt given Dr.Smith recommendations.from a cardiac standpoint pt does not need fish oil his lipids are well controlled with his statin therapy.pt verbalized understanding.

## 2014-01-11 ENCOUNTER — Encounter (HOSPITAL_COMMUNITY): Payer: Self-pay | Admitting: Interventional Cardiology

## 2014-02-18 ENCOUNTER — Other Ambulatory Visit: Payer: Self-pay | Admitting: Cardiology

## 2014-02-27 ENCOUNTER — Encounter (HOSPITAL_COMMUNITY): Payer: Self-pay | Admitting: Emergency Medicine

## 2014-02-27 ENCOUNTER — Emergency Department (HOSPITAL_COMMUNITY)
Admission: EM | Admit: 2014-02-27 | Discharge: 2014-02-27 | Disposition: A | Discharge status: Home / Self Care | Payer: BC Managed Care – PPO | Attending: Emergency Medicine | Admitting: Emergency Medicine

## 2014-02-27 DIAGNOSIS — Z79899 Other long term (current) drug therapy: Secondary | ICD-10-CM | POA: Diagnosis not present

## 2014-02-27 DIAGNOSIS — T402X5A Adverse effect of other opioids, initial encounter: Secondary | ICD-10-CM | POA: Diagnosis not present

## 2014-02-27 DIAGNOSIS — T433X5A Adverse effect of phenothiazine antipsychotics and neuroleptics, initial encounter: Secondary | ICD-10-CM | POA: Diagnosis not present

## 2014-02-27 DIAGNOSIS — T50905A Adverse effect of unspecified drugs, medicaments and biological substances, initial encounter: Secondary | ICD-10-CM

## 2014-02-27 DIAGNOSIS — Z7982 Long term (current) use of aspirin: Secondary | ICD-10-CM | POA: Insufficient documentation

## 2014-02-27 DIAGNOSIS — I2510 Atherosclerotic heart disease of native coronary artery without angina pectoris: Secondary | ICD-10-CM | POA: Insufficient documentation

## 2014-02-27 DIAGNOSIS — T363X5A Adverse effect of macrolides, initial encounter: Secondary | ICD-10-CM | POA: Insufficient documentation

## 2014-02-27 DIAGNOSIS — R42 Dizziness and giddiness: Secondary | ICD-10-CM | POA: Diagnosis present

## 2014-02-27 DIAGNOSIS — E785 Hyperlipidemia, unspecified: Secondary | ICD-10-CM | POA: Diagnosis not present

## 2014-02-27 DIAGNOSIS — Z9889 Other specified postprocedural states: Secondary | ICD-10-CM | POA: Insufficient documentation

## 2014-02-27 LAB — BASIC METABOLIC PANEL
ANION GAP: 5 (ref 5–15)
BUN: 18 mg/dL (ref 6–23)
CHLORIDE: 107 mmol/L (ref 96–112)
CO2: 27 mmol/L (ref 19–32)
Calcium: 8.6 mg/dL (ref 8.4–10.5)
Creatinine, Ser: 1.06 mg/dL (ref 0.50–1.35)
GFR calc Af Amer: 88 mL/min — ABNORMAL LOW (ref >90)
GFR, EST NON AFRICAN AMERICAN: 76 mL/min — AB (ref >90)
GLUCOSE: 118 mg/dL — AB (ref 70–99)
Potassium: 3.5 mmol/L (ref 3.5–5.1)
SODIUM: 139 mmol/L (ref 135–145)

## 2014-02-27 LAB — CBC WITH DIFFERENTIAL/PLATELET
Basophils Absolute: 0 10*3/uL (ref 0.0–0.1)
Basophils Relative: 0 % (ref 0–1)
Eosinophils Absolute: 0.1 10*3/uL (ref 0.0–0.7)
Eosinophils Relative: 3 % (ref 0–5)
HCT: 43.7 % (ref 39.0–52.0)
Hemoglobin: 14.7 g/dL (ref 13.0–17.0)
Lymphocytes Relative: 22 % (ref 12–46)
Lymphs Abs: 1.1 10*3/uL (ref 0.7–4.0)
MCH: 29 pg (ref 26.0–34.0)
MCHC: 33.6 g/dL (ref 30.0–36.0)
MCV: 86.2 fL (ref 78.0–100.0)
Monocytes Absolute: 0.4 10*3/uL (ref 0.1–1.0)
Monocytes Relative: 8 % (ref 3–12)
Neutro Abs: 3.3 10*3/uL (ref 1.7–7.7)
Neutrophils Relative %: 67 % (ref 43–77)
Platelets: 141 10*3/uL — ABNORMAL LOW (ref 150–400)
RBC: 5.07 MIL/uL (ref 4.22–5.81)
RDW: 13.1 % (ref 11.5–15.5)
WBC: 4.9 10*3/uL (ref 4.0–10.5)

## 2014-02-27 LAB — I-STAT TROPONIN, ED: Troponin i, poc: 0 ng/mL (ref 0.00–0.08)

## 2014-02-27 LAB — BRAIN NATRIURETIC PEPTIDE: B Natriuretic Peptide: 14.6 pg/mL (ref 0.0–100.0)

## 2014-02-27 MED ORDER — BENZONATATE 100 MG PO CAPS: 100.0000 mg | ORAL_CAPSULE | Freq: Three times a day (TID) | ORAL | Status: DC

## 2014-02-27 NOTE — ED Notes (Signed)
The patient said he started having dizziness and started getting nauseous.  He is taking promethazine-codeine and antibiotics for a chest cold.  The patient could not advise how much cough medication he took so he might have taken too much.  He is feeling better now.  The patient is alert, oriented x4.  The patient denies pain or any symptoms at this time.

## 2014-02-27 NOTE — ED Notes (Signed)
MD at bedside. 

## 2014-02-27 NOTE — Discharge Instructions (Signed)
Dizziness   Dizziness means you feel unsteady or lightheaded. You might feel like you are going to pass out (faint).  HOME CARE   · Drink enough fluids to keep your pee (urine) clear or pale yellow.  · Take your medicines exactly as told by your doctor. If you take blood pressure medicine, always stand up slowly from the lying or sitting position. Hold on to something to steady yourself.  · If you need to stand in one place for a long time, move your legs often. Tighten and relax your leg muscles.  · Have someone stay with you until you feel okay.  · Do not drive or use heavy machinery if you feel dizzy.  · Do not drink alcohol.  GET HELP RIGHT AWAY IF:   · You feel dizzy or lightheaded and it gets worse.  · You feel sick to your stomach (nauseous), or you throw up (vomit).  · You have trouble talking or walking.  · You feel weak or have trouble using your arms, hands, or legs.  · You cannot think clearly or have trouble forming sentences.  · You have chest pain, belly (abdominal) pain, sweating, or you are short of breath.  · Your vision changes.  · You are bleeding.  · You have problems from your medicine that seem to be getting worse.  MAKE SURE YOU:   · Understand these instructions.  · Will watch your condition.  · Will get help right away if you are not doing well or get worse.  Document Released: 01/08/2011 Document Revised: 04/13/2011 Document Reviewed: 01/08/2011  ExitCare® Patient Information ©2015 [employer]. This information is not intended to replace advice given to you by your health care provider. Make sure you discuss any questions you have with your health care provider.

## 2014-02-27 NOTE — ED Notes (Signed)
Signature pad in room not working, pt reports he understands discharge instructions.  

## 2014-02-27 NOTE — ED Provider Notes (Signed)
CSN: [id number]     Arrival date & time 02/27/14  2010 History   First MD Initiated Contact with Patient 02/27/14 2226     Chief Complaint  Patient presents with  . Dizziness    The patient said he started having dizziness and started getting nauseous.  He is taking promethazine-codeine and antibiotics for a chest cold.    . Nausea     (Consider location/radiation/quality/duration/timing/severity/associated sxs/prior Treatment) HPI  59 year old male with past history is below presents to ED for dizziness and near syncope which began approximate 1 hour ago. Patient states earlier in the day he went to his primary care doctor for cough which has been ongoing for the past week. His primary care doctor put him on a Z-Pak and cough medicine for presumed bronchitis. Patient states when he got home this evening he took a "swig" of the cough syrup and then took all of his blood pressure medications. He states 30 minutes later he took another swig of the cough syrup and then shortly thereafter felt lightheaded, nausea and as if he was about to pass out.  Patient states the nausea was similar to when he had his heart attack. It was at that time that he asked his wife to call EMS. Patient states symptoms lasted for approximately 15 minutes and resolved with rest.  Patient states symptoms resolved prior to EMS arrival. At no point did patient have any chest pain, palpitations, shortness of breath, diaphoresis.   Past Medical History  Diagnosis Date  . CAD (coronary artery disease)   . Hyperlipidemia    Past Surgical History  Procedure Laterality Date  . No past surgeries    . Left heart catheterization with coronary angiogram N/A 04/02/2013    Procedure: LEFT HEART CATHETERIZATION WITH CORONARY ANGIOGRAM;  Surgeon: Lesleigh Noe, MD;  Location: [facility] CATH LAB;  Service: Cardiovascular;  Laterality: N/A;   Family History  Problem Relation Age of Onset  . Other Father    History  Substance Use  Topics  . Smoking status: Never Smoker   . Smokeless tobacco: Not on file  . Alcohol Use: No    Review of Systems  Constitutional: Negative for fever, activity change and appetite change.  HENT: Negative for congestion, rhinorrhea and sore throat.   Eyes: Negative for visual disturbance.  Respiratory: Negative for cough and shortness of breath.   Cardiovascular: Negative for chest pain, palpitations and leg swelling.  Gastrointestinal: Positive for nausea. Negative for vomiting, abdominal pain and diarrhea.  Genitourinary: Negative for dysuria, flank pain, decreased urine volume and difficulty urinating.  Musculoskeletal: Negative for back pain and neck pain.  Skin: Negative for rash.  Neurological: Positive for syncope (near) and light-headedness. Negative for dizziness, speech difficulty, weakness, numbness and headaches.  Psychiatric/Behavioral: Negative for confusion.      Allergies  Shellfish allergy  Home Medications   Prior to Admission medications   Medication Sig Start Date End Date Taking? Authorizing Provider  acetaminophen-codeine (TYLENOL #3) 300-30 MG per tablet Take 1 tablet by mouth every 4 (four) hours as needed for moderate pain.    Historical Provider, MD  aspirin EC 81 MG EC tablet Take 1 tablet (81 mg total) by mouth daily. 04/05/13   Brittainy Sherlynn Carbon, PA-C  atorvastatin (LIPITOR) 40 MG tablet TAKE 1 TABLET BY MOUTH DAILY AT 6:00PM 09/21/13   Lesleigh Noe, MD  BRILINTA 90 MG TABS tablet TAKE 1 TABLET TWICE A DAY 02/19/14   Donato Schultz, MD  lisinopril (PRINIVIL,ZESTRIL) 2.5 MG tablet TAKE 1 TABLET EVERY DAY 09/21/13   Lyn Records III, MD  metoprolol (LOPRESSOR) 50 MG tablet Take 25 mg by mouth 2 (two) times daily. 04/05/13   Brittainy Sherlynn Carbon, PA-C  nitroGLYCERIN (NITROSTAT) 0.4 MG SL tablet Place 1 tablet (0.4 mg total) under the tongue every 5 (five) minutes x 3 doses as needed for chest pain. 04/05/13   Brittainy M Simmons, PA-C   BP 113/66 mmHg  Pulse  68  Temp(Src) 98 F (36.7 C)  Resp 17  SpO2 99% Physical Exam  Constitutional: He is oriented to person, place, and time. He appears well-developed and well-nourished. No distress.  HENT:  Head: Normocephalic and atraumatic.  Nose: Nose normal.  Mouth/Throat: Oropharynx is clear and moist. No oropharyngeal exudate.  Eyes: Conjunctivae and EOM are normal.  Neck: Normal range of motion. Neck supple. No JVD present.  Cardiovascular: Normal rate, regular rhythm, normal heart sounds and intact distal pulses.   Pulmonary/Chest: Effort normal and breath sounds normal. No respiratory distress.  Abdominal: Soft. He exhibits no distension. There is no tenderness. There is no rebound and no guarding.  Musculoskeletal: Normal range of motion.  Neurological: He is alert and oriented to person, place, and time. No cranial nerve deficit.  HDS, AAOx4. PERRL, EOMI, TML, face sym. CN 2-12 grossly intact. 5/5 sym, no drift, SILT, normal gait and coordination.  Skin: Skin is warm and dry. No rash noted.  Psychiatric: He has a normal mood and affect.    ED Course  Procedures (including critical care time) Labs Review Labs Reviewed  BASIC METABOLIC PANEL - Abnormal; Notable for the following:    Glucose, Bld 118 (*)    GFR calc non Af Amer 76 (*)    GFR calc Af Amer 88 (*)    All other components within normal limits  CBC WITH DIFFERENTIAL/PLATELET - Abnormal; Notable for the following:    Platelets 141 (*)    All other components within normal limits  BRAIN NATRIURETIC PEPTIDE  I-STAT TROPOININ, ED    Imaging Review No results found.   EKG Interpretation None      MDM   Final diagnoses:  None    Patient presents with dizziness, nausea after taking new cough medicine. Symptoms are resolved now. Patient's HDS and in no apparent distress and ED. Patient is a benign cardiopulmonary and neuro exam. Labs and EKG are unremarkable. I think patient likely had dizziness secondary to his cough  medicine. I have advised him to abstain from taking any more cough medicine and have provided him with a perception for [employer]. Given no white count, fever , clear lungs, there is no indication for chest x-ray at this time. I have advised him to follow closely with his PCP if his cough is ongoing.   Clinical Impression: 1. Medication side effect, initial encounter   2. Dizziness     Disposition: Discharge  Condition: Good  I have discussed the results, Dx and Tx plan with the pt(& family if present). He/she/they expressed understanding and agree(s) with the plan. Discharge instructions discussed at great length. Strict return precautions discussed and pt &/or family have verbalized understanding of the instructions. No further questions at time of discharge.    New Prescriptions   BENZONATATE (TESSALON) 100 MG CAPSULE    Take 1 capsule (100 mg total) by mouth every 8 (eight) hours.    Follow Up: Juline Patch, MD [address] [place] [address]  [phone]   As needed  [facility] EMERGENCY DEPARTMENT [address] [id number] Wilhemina Bonito [place] [address] [phone]  If symptoms worsen   Pt seen in conjunction with Dr. Geoffery Lyons, MD  Ames Dura, DO [facility] Emergency Medicine Resident - PGY-2     Ames Dura, MD 02/27/14 2952  Geoffery Lyons, MD 03/01/14 [phone]

## 2014-03-17 ENCOUNTER — Other Ambulatory Visit: Payer: Self-pay | Admitting: Cardiology

## 2014-03-18 ENCOUNTER — Other Ambulatory Visit: Payer: Self-pay | Admitting: Interventional Cardiology

## 2014-05-01 ENCOUNTER — Encounter: Payer: Self-pay | Admitting: Interventional Cardiology

## 2014-05-03 ENCOUNTER — Telehealth: Payer: Self-pay | Admitting: Interventional Cardiology

## 2014-05-03 NOTE — Telephone Encounter (Signed)
Returned pt call. Pt had labs done with his pcp on 3/21.adv him that we did receive a copy of the results. Lab appt for 4/1 cancelled, pt is to keep appt with Dr.Smith on 4/8.pt verbalized understanding.

## 2014-05-03 NOTE — Telephone Encounter (Signed)
New Message       Pt calling stating that he had blood work at another doctors office and had them send over his lab results to our office and pt is wanting to know if we received it so he won't have to come here tomorrow for more labs. Please call back and advise.

## 2014-05-04 ENCOUNTER — Other Ambulatory Visit: Payer: BC Managed Care – PPO

## 2014-05-10 DIAGNOSIS — I2510 Atherosclerotic heart disease of native coronary artery without angina pectoris: Secondary | ICD-10-CM | POA: Insufficient documentation

## 2014-05-10 DIAGNOSIS — I10 Essential (primary) hypertension: Secondary | ICD-10-CM

## 2014-05-10 HISTORY — DX: Essential (primary) hypertension: I10

## 2014-05-10 HISTORY — DX: Atherosclerotic heart disease of native coronary artery without angina pectoris: I25.10

## 2014-05-11 ENCOUNTER — Encounter: Payer: Self-pay | Admitting: Interventional Cardiology

## 2014-05-11 ENCOUNTER — Ambulatory Visit (INDEPENDENT_AMBULATORY_CARE_PROVIDER_SITE_OTHER): Payer: BC Managed Care – PPO | Admitting: Interventional Cardiology

## 2014-05-11 VITALS — BP 116/84 | HR 75 | Ht 72.0 in | Wt 211.0 lb

## 2014-05-11 DIAGNOSIS — I2510 Atherosclerotic heart disease of native coronary artery without angina pectoris: Secondary | ICD-10-CM

## 2014-05-11 DIAGNOSIS — R05 Cough: Secondary | ICD-10-CM | POA: Diagnosis not present

## 2014-05-11 DIAGNOSIS — T464X5A Adverse effect of angiotensin-converting-enzyme inhibitors, initial encounter: Secondary | ICD-10-CM

## 2014-05-11 DIAGNOSIS — E785 Hyperlipidemia, unspecified: Secondary | ICD-10-CM

## 2014-05-11 DIAGNOSIS — R058 Other specified cough: Secondary | ICD-10-CM | POA: Insufficient documentation

## 2014-05-11 DIAGNOSIS — I10 Essential (primary) hypertension: Secondary | ICD-10-CM

## 2014-05-11 HISTORY — DX: Other specified cough: R05.8

## 2014-05-11 HISTORY — DX: Adverse effect of angiotensin-converting-enzyme inhibitors, initial encounter: T46.4X5A

## 2014-05-11 MED ORDER — METOPROLOL TARTRATE 25 MG PO TABS: 25.0000 mg | ORAL_TABLET | Freq: Two times a day (BID) | ORAL | Status: DC

## 2014-05-11 NOTE — Progress Notes (Signed)
Cardiology Office Note   Date:  05/11/2014   ID:  Mark Levine, DOB 11/16/1955, MRN [id number]  PCP:  Juline Patch, MD  Cardiologist:   Lesleigh Noe, MD   No chief complaint on file.     History of Present Illness: Mark Levine is a 59 y.o. male who presents for CAD follow-up. He has had no particular exertional complaints. He has chronic erectile dysfunction but is using Viagra without difficulty. No bleeding on Brilinta.    Past Medical History  Diagnosis Date  . CAD (coronary artery disease)   . Hyperlipidemia     Past Surgical History  Procedure Laterality Date  . No past surgeries    . Left heart catheterization with coronary angiogram N/A 04/02/2013    Procedure: LEFT HEART CATHETERIZATION WITH CORONARY ANGIOGRAM;  Surgeon: Lesleigh Noe, MD;  Location: [facility] CATH LAB;  Service: Cardiovascular;  Laterality: N/A;     Current Outpatient Prescriptions  Medication Sig Dispense Refill  . acetaminophen-codeine (TYLENOL #3) 300-30 MG per tablet Take 1 tablet by mouth every 4 (four) hours as needed for moderate pain.    Marland Kitchen aspirin EC 81 MG EC tablet Take 1 tablet (81 mg total) by mouth daily.    Marland Kitchen atorvastatin (LIPITOR) 40 MG tablet TAKE 1 TABLET BY MOUTH DAILY AT 6:00PM 30 tablet 5  . metoprolol (LOPRESSOR) 25 MG tablet Take 1 tablet (25 mg total) by mouth 2 (two) times daily. 60 tablet 11  . nitroGLYCERIN (NITROSTAT) 0.4 MG SL tablet Place 1 tablet (0.4 mg total) under the tongue every 5 (five) minutes x 3 doses as needed for chest pain. 25 tablet 2  . VIAGRA 100 MG tablet Take 100 mg by mouth daily as needed.  1   No current facility-administered medications for this visit.    Allergies:   Shellfish allergy    Social History:  The patient  reports that he has never smoked. He does not have any smokeless tobacco history on file. He reports that he does not drink alcohol or use illicit drugs.   Family History:  The patient's family history includes Other in  his father.    ROS:  Please see the history of present illness.   Otherwise, review of systems are positive for occasional muscle aches. He has a dry hacking cough.   All other systems are reviewed and negative.    PHYSICAL EXAM: VS:  BP 116/84 mmHg  Pulse 75  Ht 6' (1.829 m)  Wt 211 lb (95.709 kg)  BMI 28.61 kg/m2  SpO2 98% , BMI Body mass index is 28.61 kg/(m^2). GEN: Well nourished, well developed, in no acute distress HEENT: normal Neck: no JVD, carotid bruits, or masses Cardiac: RRR; no murmurs, rubs, or gallops,no edema  Respiratory:  clear to auscultation bilaterally, normal work of breathing GI: soft, nontender, nondistended, + BS MS: no deformity or atrophy Skin: warm and dry, no rash Neuro:  Strength and sensation are intact Psych: euthymic mood, full affect   EKG:  EKG is not ordered today   Recent Labs: 02/27/2014: B Natriuretic Peptide 14.6; BUN 18; Creatinine 1.06; Hemoglobin 14.7; Platelets 141*; Potassium 3.5; Sodium 139    Lipid Panel    Component Value Date/Time   CHOL 101 05/05/2013 0925   TRIG 33 05/05/2013 0925   HDL 42 05/05/2013 0925   CHOLHDL 2.4 05/05/2013 0925   VLDL 7 05/05/2013 0925   LDLCALC 52 05/05/2013 0925      Wt Readings  from Last 3 Encounters:  05/11/14 211 lb (95.709 kg)  10/11/13 205 lb (92.987 kg)  05/29/13 218 lb (98.884 kg)      Other studies Reviewed: Additional studies/ records that were reviewed today include: . Laboratory data from primary physician which demonstrated total cholesterol of 110 and LDL of 53. Liver panel was unremarkable.    ASSESSMENT AND PLAN:  Coronary artery disease involving native coronary artery of native heart without angina pectoris: Asymptomatic  Cough, likely related to lisinopril  Hyperlipidemia: On statin therapy. Recent laboratory data by his primary physician demonstrated a total cholesterol  Essential hypertension: Controlled     Current medicines are reviewed at length with  the patient today.  The patient has concerns regarding medicines.  The following changes have been made:  Discontinue Brilinta is he is now greater than one year out from stenting. Discontinue lisinopril as this is causing his cough. Change metoprolol to 25 mg tablets to be taken twice a day  Labs/ tests ordered today includes: lipid panel No orders of the defined types were placed in this encounter.     Disposition:   FU with Mendel Ryder in 12  months   Signed, Lesleigh Noe, MD  05/11/2014 9:38 AM    [facility] [address] [place], [place], [place]  [address] Phone: [phone]; Fax: [phone]

## 2014-05-11 NOTE — Patient Instructions (Addendum)
Your physician has recommended you make the following change in your medication:  1) STOP Brilinta 2) STOP Lisinopril  Take all other medications as prescribed. An Rx for Metoprolol 25mg  Twice daily has been sent to your pharmacy    Your physician discussed the importance of regular exercise and recommended that you start or continue a regular exercise program for good health.  Your physician wants you to follow-up in: 1 year with Dr.Smith You will receive a reminder letter in the mail two months in advance. If you don't receive a letter, please call our office to schedule the follow-up appointment.

## 2014-09-30 ENCOUNTER — Other Ambulatory Visit: Payer: Self-pay | Admitting: Adult Health

## 2015-02-05 NOTE — Progress Notes (Signed)
Cardiology Office Note    Date:  02/06/2015   ID:  Mark Levine, DOB 10-17-1955, MRN [id number]  PCP:  Pearson Grippe, MD  Cardiologist:  Dr. Verdis Prime   Electrophysiologist:  n/a  Chief Complaint  Patient presents with  . Chest Pain    History of Present Illness:  Mark Levine is a 60 y.o. male with a hx of CAD.  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 Course was complicated by ventricular fibrillation x2 treated with defibrillation each time. Emergent LHC demonstrated an occluded RCA which was treated with a Promus DES. He had residual disease in the apical LAD that was treated medically. Ejection fraction was preserved at 50%.  Last seen by Dr. Katrinka Blazing 4/16. ACE inhibitor was stopped secondary to cough.  He comes in today with complaints of chest pain that occurred a few weeks ago.  He does note a larger meal than normal prior to his symptoms occurring.  He had about 20 minutes of chest tightness with assoc nausea, dizziness.  He denies any associated arm or jaw pain, diaphoresis, syncope. He did not take NTG.  He has not had recurrent symptoms.  He does exercise and has not had any exertional symptoms.  However, he was concerned because some of his symptoms several weeks ago reminded him of how felt when he had his MI.   Past Medical History  Diagnosis Date  . CAD (coronary artery disease)     a. Inf STEMI5/15 >> LHC (04/02/13): ostial left main 20-25, apical LAD 70, mid to distal RCA occluded, EF 50% inferior HK. >> PCI: Promus premier (16 x 3 mm) DES to the RCA.   Marland Kitchen Hyperlipidemia     Past Surgical History  Procedure Laterality Date  . No past surgeries    . Left heart catheterization with coronary angiogram N/A 04/02/2013    Procedure: LEFT HEART CATHETERIZATION WITH CORONARY ANGIOGRAM;  Surgeon: Lesleigh Noe, MD;  Location: [facility] CATH LAB;  Service: Cardiovascular;  Laterality: N/A;    Current Outpatient Prescriptions  Medication Sig Dispense Refill  .  acetaminophen-codeine (TYLENOL #3) 300-30 MG per tablet Take 1 tablet by mouth every 4 (four) hours as needed for moderate pain.    Marland Kitchen aspirin EC 81 MG EC tablet Take 1 tablet (81 mg total) by mouth daily.    Marland Kitchen atorvastatin (LIPITOR) 40 MG tablet TAKE 1 TABLET BY MOUTH DAILY AT 6:00PM 30 tablet 5  . metoprolol (LOPRESSOR) 25 MG tablet Take 1 tablet (25 mg total) by mouth 2 (two) times daily. 60 tablet 11  . nitroGLYCERIN (NITROSTAT) 0.4 MG SL tablet Place 1 tablet (0.4 mg total) under the tongue every 5 (five) minutes x 3 doses as needed for chest pain. 25 tablet 2  . VIAGRA 100 MG tablet Take 100 mg by mouth daily as needed.  1   No current facility-administered medications for this visit.    Allergies:   Shellfish allergy   Social History   Social History  . Marital Status: Married    Spouse Name: N/A  . Number of Children: N/A  . Years of Education: N/A   Social History Main Topics  . Smoking status: Never Smoker   . Smokeless tobacco: None  . Alcohol Use: No  . Drug Use: No  . Sexual Activity: Not Asked   Other Topics Concern  . None   Social History Narrative     Family History:  The patient's family history includes Other  in his father.   ROS:   Please see the history of present illness.    Review of Systems  HENT: Positive for headaches.   All other systems reviewed and are negative.   PHYSICAL EXAM:   VS:  BP 130/72 mmHg  Pulse 64  Ht 6' 0.5" (1.842 m)  Wt 225 lb (102.059 kg)  BMI 30.08 kg/m2   GEN: Well nourished, well developed, in no acute distress HEENT: normal Neck: no JVD, no masses Cardiac: Normal S1/S2, RRR; no murmurs, rubs, or gallops, no edema;     Respiratory:  clear to auscultation bilaterally; no wheezing, rhonchi or rales GI: soft, nontender, nondistended, + BS MS: no deformity or atrophy Skin: warm and dry, no rash Neuro:  Bilateral strength equal, no focal deficits  Psych: Alert and oriented x 3, normal affect  Wt Readings from Last 3  Encounters:  02/06/15 225 lb (102.059 kg)  05/11/14 211 lb (95.709 kg)  10/11/13 205 lb (92.987 kg)      Studies/Labs Reviewed:   EKG:  EKG is  ordered today.  The ekg ordered today demonstrates NSR, HR 60, normal axis, QTC 390 ms, no change from prior tracing  Recent Labs: 02/27/2014: B Natriuretic Peptide 14.6; BUN 18; Creatinine, Ser 1.06; Hemoglobin 14.7; Platelets 141*; Potassium 3.5; Sodium 139   Recent Lipid Panel    Component Value Date/Time   CHOL 101 05/05/2013 0925   TRIG 33 05/05/2013 0925   HDL 42 05/05/2013 0925   CHOLHDL 2.4 05/05/2013 0925   VLDL 7 05/05/2013 0925   LDLCALC 52 05/05/2013 0925    Additional studies/ records that were reviewed today include:   LHC (04/02/13): ostial left main 20-25, apical LAD 70, mid to distal RCA occluded, EF 50% inferior HK.  PCI: Promus premier (16 x 3 mm) DES to the RCA.   ASSESSMENT:    1. Chest pain, unspecified chest pain type   2. Coronary artery disease involving native coronary artery of native heart without angina pectoris   3. Hyperlipidemia     PLAN:  In order of problems listed above:  1. Chest Pain - Etiology not clear.  He had symptoms that reminded him of his prior angina. But he has not had recurrent symptoms in several week.  ECG is normal.  He denies any exertional symptoms. He likely had symptoms related to a GI etiology given his large meal preceding his symptoms. However, since his symptoms reminded him of his prior angina, I think he should undergo stress testing to rule out ischemia as a cause.  -  Arrange ETT-Myoview  2. CAD - s/p prior Inferior STEMI tx with DES to the RCA.  Arrange ETT-Myoview as noted.  Continue ASA, statin, beta-blocker.   2. Hyperlipidemia: Continue statin. Arrange FU Lipids and LFTs.    Medication Adjustments/Labs and Tests Ordered: Current medicines are reviewed at length with the patient today.  Concerns regarding medicines are outlined above.  Medication changes, Labs  and Tests ordered today are listed below. Patient Instructions  Medication Instructions:  A REFILL FOR NITRO HAS BEEN SENT IN  Labwork: FASTING LIPID AND LIVER PANEL TO BE DONE WHEN YOU COME IN FOR YOUR STRESS TEST  Testing/Procedures: Your physician has requested that you have en exercise stress myoview. For further information please visit [email or web address] Please follow instruction sheet, as given.   Follow-Up: DR. Katrinka Blazing 05/2015;   Any Other Special Instructions Will Be Listed Below (If Applicable).  If you need a refill on your cardiac  medications before your next appointment, please call your pharmacy.      Signed, Tereso Newcomer, PA-C  02/06/2015 12:48 PM    [facility] [address] [place], [place], [place]  [address] Phone: [phone]; Fax: [phone]

## 2015-02-06 ENCOUNTER — Encounter: Payer: Self-pay | Admitting: Physician Assistant

## 2015-02-06 ENCOUNTER — Ambulatory Visit (INDEPENDENT_AMBULATORY_CARE_PROVIDER_SITE_OTHER): Payer: BC Managed Care – PPO | Admitting: Physician Assistant

## 2015-02-06 VITALS — BP 130/72 | HR 64 | Ht 72.5 in | Wt 225.0 lb

## 2015-02-06 DIAGNOSIS — R079 Chest pain, unspecified: Secondary | ICD-10-CM

## 2015-02-06 DIAGNOSIS — I2510 Atherosclerotic heart disease of native coronary artery without angina pectoris: Secondary | ICD-10-CM

## 2015-02-06 DIAGNOSIS — E785 Hyperlipidemia, unspecified: Secondary | ICD-10-CM | POA: Diagnosis not present

## 2015-02-06 MED ORDER — NITROGLYCERIN 0.4 MG SL SUBL: 0.4000 mg | SUBLINGUAL_TABLET | SUBLINGUAL | Status: DC | PRN

## 2015-02-06 NOTE — Patient Instructions (Addendum)
Medication Instructions:  A REFILL FOR NITRO HAS BEEN SENT IN  Labwork: FASTING LIPID AND LIVER PANEL TO BE DONE WHEN YOU COME IN FOR YOUR STRESS TEST  Testing/Procedures: Your physician has requested that you have en exercise stress myoview. For further information please visit [email or web address] Please follow instruction sheet, as given.   Follow-Up: DR. Katrinka Blazing 05/2015;   Any Other Special Instructions Will Be Listed Below (If Applicable).  If you need a refill on your cardiac medications before your next appointment, please call your pharmacy.

## 2015-02-13 ENCOUNTER — Telehealth (HOSPITAL_COMMUNITY): Payer: Self-pay | Admitting: *Deleted

## 2015-02-13 NOTE — Telephone Encounter (Signed)
Patient given detailed instructions per Myocardial Perfusion Study Information Sheet for the test on 02/15/15 at 0745. Patient notified to arrive 15 minutes early and that it is imperative to arrive on time for appointment to keep from having the test rescheduled.  If you need to cancel or reschedule your appointment, please call the office within 24 hours of your appointment. Failure to do so may result in a cancellation of your appointment, and a $50 no show fee. Patient verbalized understanding.Mark Levine, Mark Levine

## 2015-02-15 ENCOUNTER — Ambulatory Visit (HOSPITAL_COMMUNITY): Payer: BC Managed Care – PPO | Attending: Physician Assistant

## 2015-02-15 ENCOUNTER — Other Ambulatory Visit (INDEPENDENT_AMBULATORY_CARE_PROVIDER_SITE_OTHER): Payer: BC Managed Care – PPO | Admitting: *Deleted

## 2015-02-15 DIAGNOSIS — I517 Cardiomegaly: Secondary | ICD-10-CM | POA: Diagnosis not present

## 2015-02-15 DIAGNOSIS — R079 Chest pain, unspecified: Secondary | ICD-10-CM | POA: Diagnosis not present

## 2015-02-15 DIAGNOSIS — I2510 Atherosclerotic heart disease of native coronary artery without angina pectoris: Secondary | ICD-10-CM

## 2015-02-15 DIAGNOSIS — R9439 Abnormal result of other cardiovascular function study: Secondary | ICD-10-CM | POA: Diagnosis not present

## 2015-02-15 DIAGNOSIS — I10 Essential (primary) hypertension: Secondary | ICD-10-CM | POA: Diagnosis not present

## 2015-02-15 LAB — MYOCARDIAL PERFUSION IMAGING
CHL CUP MPHR: 162 {beats}/min
CHL CUP NUCLEAR SDS: 0
CHL CUP NUCLEAR SRS: 2
CSEPHR: 96 %
Estimated workload: 12.5 METS
Exercise duration (min): 10 min
Exercise duration (sec): 30 s
LHR: 0.35
LV dias vol: 131 mL
LV sys vol: 68 mL
Peak HR: 146 {beats}/min
Rest HR: 52 {beats}/min
SSS: 2
TID: 1.03

## 2015-02-15 LAB — HEPATIC FUNCTION PANEL
ALT: 58 U/L — AB (ref 9–46)
AST: 43 U/L — AB (ref 10–35)
Albumin: 4 g/dL (ref 3.6–5.1)
Alkaline Phosphatase: 46 U/L (ref 40–115)
BILIRUBIN DIRECT: 0.1 mg/dL (ref <=0.2)
BILIRUBIN INDIRECT: 0.6 mg/dL (ref 0.2–1.2)
BILIRUBIN TOTAL: 0.7 mg/dL (ref 0.2–1.2)
TOTAL PROTEIN: 7 g/dL (ref 6.1–8.1)

## 2015-02-15 LAB — LIPID PANEL
Cholesterol: 115 mg/dL — ABNORMAL LOW (ref 125–200)
HDL: 40 mg/dL (ref >=40)
LDL CALC: 62 mg/dL (ref <130)
Total CHOL/HDL Ratio: 2.9 Ratio (ref <=5.0)
Triglycerides: 65 mg/dL (ref <150)
VLDL: 13 mg/dL (ref <30)

## 2015-02-15 MED ORDER — TECHNETIUM TC 99M SESTAMIBI GENERIC - CARDIOLITE
10.7000 | Freq: Once | INTRAVENOUS | Status: AC | PRN
  Administered 2015-02-15: 11 via INTRAVENOUS

## 2015-02-15 MED ORDER — TECHNETIUM TC 99M SESTAMIBI GENERIC - CARDIOLITE
31.7000 | Freq: Once | INTRAVENOUS | Status: AC | PRN
  Administered 2015-02-15: 31.7 via INTRAVENOUS

## 2015-02-18 ENCOUNTER — Other Ambulatory Visit: Payer: Self-pay | Admitting: *Deleted

## 2015-02-18 ENCOUNTER — Encounter: Payer: Self-pay | Admitting: Physician Assistant

## 2015-02-18 DIAGNOSIS — I10 Essential (primary) hypertension: Secondary | ICD-10-CM

## 2015-02-18 DIAGNOSIS — I2510 Atherosclerotic heart disease of native coronary artery without angina pectoris: Secondary | ICD-10-CM

## 2015-02-18 DIAGNOSIS — E785 Hyperlipidemia, unspecified: Secondary | ICD-10-CM

## 2015-02-20 ENCOUNTER — Telehealth: Payer: Self-pay | Admitting: *Deleted

## 2015-02-20 NOTE — Telephone Encounter (Signed)
Pt notified of myoview results and findings by phone with verbal understanding. Pt advised per Bing Neighbors. PA need to have echo since last echo ? 2015 when pt had MI. Pt would like to d/w Dr. Katrinka Blazing about echo 1st, appt 05/06/15 4:15 Dr. Katrinka Blazing.

## 2015-03-04 ENCOUNTER — Telehealth: Payer: Self-pay | Admitting: Interventional Cardiology

## 2015-03-04 NOTE — Telephone Encounter (Signed)
I returned call to pt who said he was still waiting for his myoview results. I stated to pt that we went over results on 02/20/15 for myoview. Pt seemed very confused becuase he did not remember s/w Ernestine Mcmurray. RN o 02/18/15 about lab results and why he is coming in 03/22/15 for lab work again. I went over the lab results 02/18/15 that Ernestine Mcmurray RN already had done and explained the reason for the repeat LFT 03/22/15. Pt gave verbal understanding about myoview and lab results that we went back over with again today.

## 2015-03-04 NOTE — Telephone Encounter (Signed)
New Message  Pt called. Req a call back to discuss the Myoview results. Pt states that he was advised that he would receive a call back to discuss the stress test results. He says that the call he recently had was for lab results only. He is a little upset that it has taken over 1 week to receive a call back. Please call.

## 2015-03-22 ENCOUNTER — Other Ambulatory Visit (INDEPENDENT_AMBULATORY_CARE_PROVIDER_SITE_OTHER): Payer: BC Managed Care – PPO | Admitting: *Deleted

## 2015-03-22 DIAGNOSIS — I2510 Atherosclerotic heart disease of native coronary artery without angina pectoris: Secondary | ICD-10-CM

## 2015-03-22 DIAGNOSIS — I10 Essential (primary) hypertension: Secondary | ICD-10-CM

## 2015-03-22 DIAGNOSIS — E785 Hyperlipidemia, unspecified: Secondary | ICD-10-CM

## 2015-03-22 LAB — HEPATIC FUNCTION PANEL
ALK PHOS: 48 U/L (ref 40–115)
ALT: 55 U/L — AB (ref 9–46)
AST: 37 U/L — AB (ref 10–35)
Albumin: 3.7 g/dL (ref 3.6–5.1)
BILIRUBIN DIRECT: 0.2 mg/dL (ref <=0.2)
BILIRUBIN INDIRECT: 0.5 mg/dL (ref 0.2–1.2)
Total Bilirubin: 0.7 mg/dL (ref 0.2–1.2)
Total Protein: 6.8 g/dL (ref 6.1–8.1)

## 2015-03-25 ENCOUNTER — Telehealth: Payer: Self-pay | Admitting: *Deleted

## 2015-03-25 NOTE — Telephone Encounter (Signed)
Pt has been notified of lab results by phone with verbal understanding . Advised pt to f/u w/PCP in regards to elevated AST/ALT. Pt agreeable to plan of care. Labs faxed to PCP.

## 2015-03-25 NOTE — Telephone Encounter (Signed)
Tereso Newcomer, [facility] saw pt today in the office. He stated pt is asking about his chest ct results ordered by neurology, PA did not see a report. Bing Neighbors. PA asked for me to call [facility] Radiology for report to be read and sent to Neuro for pt to get results. Tonya in Radiology stated looks like tech did not finish report which is the reason why report not read yet by Radiologist. Archie Patten stated she will have tech finish report for Radiologist to read and forward report to Neurologist. I thanked her for her time and effort in this matter.

## 2015-04-02 ENCOUNTER — Other Ambulatory Visit: Payer: Self-pay | Admitting: Internal Medicine

## 2015-04-02 DIAGNOSIS — R7401 Elevation of levels of liver transaminase levels: Secondary | ICD-10-CM

## 2015-04-02 DIAGNOSIS — R740 Nonspecific elevation of levels of transaminase and lactic acid dehydrogenase [LDH]: Principal | ICD-10-CM

## 2015-04-12 ENCOUNTER — Other Ambulatory Visit: Payer: Self-pay | Admitting: Adult Health

## 2015-04-12 ENCOUNTER — Ambulatory Visit
Admission: RE | Admit: 2015-04-12 | Discharge: 2015-04-12 | Disposition: A | Discharge status: Home / Self Care | Payer: BC Managed Care – PPO | Source: Ambulatory Visit | Attending: Internal Medicine | Admitting: Internal Medicine

## 2015-04-12 DIAGNOSIS — R7401 Elevation of levels of liver transaminase levels: Secondary | ICD-10-CM

## 2015-04-12 DIAGNOSIS — R740 Nonspecific elevation of levels of transaminase and lactic acid dehydrogenase [LDH]: Principal | ICD-10-CM

## 2015-05-06 ENCOUNTER — Encounter: Payer: Self-pay | Admitting: Interventional Cardiology

## 2015-05-06 ENCOUNTER — Ambulatory Visit (INDEPENDENT_AMBULATORY_CARE_PROVIDER_SITE_OTHER): Payer: BC Managed Care – PPO | Admitting: Interventional Cardiology

## 2015-05-06 VITALS — BP 126/74 | HR 62 | Ht 72.5 in | Wt 220.4 lb

## 2015-05-06 DIAGNOSIS — E785 Hyperlipidemia, unspecified: Secondary | ICD-10-CM | POA: Diagnosis not present

## 2015-05-06 DIAGNOSIS — I10 Essential (primary) hypertension: Secondary | ICD-10-CM | POA: Diagnosis not present

## 2015-05-06 DIAGNOSIS — I2510 Atherosclerotic heart disease of native coronary artery without angina pectoris: Secondary | ICD-10-CM | POA: Diagnosis not present

## 2015-05-06 DIAGNOSIS — N529 Male erectile dysfunction, unspecified: Secondary | ICD-10-CM | POA: Diagnosis not present

## 2015-05-06 NOTE — Progress Notes (Signed)
Cardiology Office Note   Date:  05/06/2015   ID:  Mark Levine, DOB July 26, 1955, MRN [id number]  PCP:  Mark Grippe, MD  Cardiologist:  Mark Noe, MD   Chief Complaint  Patient presents with  . Coronary Artery Disease      History of Present Illness: Mark Levine is a 60 y.o. male who presents for CAD with DES in LAD 04/2013, hypertension, hyperlipidemia, and erectile dysfunction  Overall Mr. Mark Levine is doing well. He denies angina. He has occasional fleeting chest discomfort that is nonexertional. He has not needed nitroglycerin. He has no limitations in physical activity. His been able to play golf without concern.  Past Medical History  Diagnosis Date  . CAD (coronary artery disease)     a. Inf STEMI5/15 >> LHC (04/02/13): ostial left main 20-25, apical LAD 70, mid to distal RCA occluded, EF 50% inferior HK. >> PCI: Promus premier (16 x 3 mm) DES to the RCA.   Marland Kitchen Hyperlipidemia   . History of nuclear stress test     a.Myoview 1/17: EF 48%, apical and apical lateral scar, no ischemia; Intermediate Risk (low EF)    Past Surgical History  Procedure Laterality Date  . No past surgeries    . Left heart catheterization with coronary angiogram N/A 04/02/2013    Procedure: LEFT HEART CATHETERIZATION WITH CORONARY ANGIOGRAM;  Surgeon: Mark Noe, MD;  Location: [facility] CATH LAB;  Service: Cardiovascular;  Laterality: N/A;     Current Outpatient Prescriptions  Medication Sig Dispense Refill  . acetaminophen-codeine (TYLENOL #3) 300-30 MG per tablet Take 1 tablet by mouth every 4 (four) hours as needed for moderate pain.    Marland Kitchen ALPRAZolam (XANAX) 0.25 MG tablet Take 0.25 mg by mouth 3 (three) times daily as needed. (anxiety)  0  . aspirin EC 81 MG EC tablet Take 1 tablet (81 mg total) by mouth daily.    Marland Kitchen atorvastatin (LIPITOR) 40 MG tablet Take 20 mg by mouth daily.    . metoprolol (LOPRESSOR) 25 MG tablet Take 1 tablet (25 mg total) by mouth 2 (two) times daily. 60 tablet 11   . nitroGLYCERIN (NITROSTAT) 0.4 MG SL tablet Place 1 tablet (0.4 mg total) under the tongue every 5 (five) minutes x 3 doses as needed for chest pain. 25 tablet 2  . VIAGRA 100 MG tablet Take 100 mg by mouth daily as needed for erectile dysfunction.   1   No current facility-administered medications for this visit.    Allergies:   Shellfish allergy    Social History:  The patient  reports that he has never smoked. He has never used smokeless tobacco. He reports that he does not drink alcohol or use illicit drugs.   Family History:  The patient's family history includes Other in his father.    ROS:  Please see the history of present illness.   Otherwise, review of systems are positive for erectile dysfunction. Has an over-the-counter supplement that he wanted to discuss. I had informed him that I knew nothing about this particular medication..   All other systems are reviewed and negative.    PHYSICAL EXAM: VS:  BP 126/74 mmHg  Pulse 62  Ht 6' 0.5" (1.842 m)  Wt 220 lb 6.4 oz (99.973 kg)  BMI 29.46 kg/m2 , BMI Body mass index is 29.46 kg/(m^2). GEN: Well nourished, well developed, in no acute distress HEENT: normal Neck: no JVD, carotid bruits, or masses Cardiac: RRR.  There is  no murmur, rub, or gallop. There is no edema. Respiratory:  clear to auscultation bilaterally, normal work of breathing. GI: soft, nontender, nondistended, + BS MS: no deformity or atrophy Skin: warm and dry, no rash Neuro:  Strength and sensation are intact Psych: euthymic mood, full affect   EKG:  EKG is not ordered today.    Recent Labs: 03/22/2015: ALT 55*    Lipid Panel    Component Value Date/Time   CHOL 115* 02/15/2015 0737   TRIG 65 02/15/2015 0737   HDL 40 02/15/2015 0737   CHOLHDL 2.9 02/15/2015 0737   VLDL 13 02/15/2015 0737   LDLCALC 62 02/15/2015 0737      Wt Readings from Last 3 Encounters:  05/06/15 220 lb 6.4 oz (99.973 kg)  02/06/15 225 lb (102.059 kg)  05/11/14 211 lb  (95.709 kg)      Other studies Reviewed: Additional studies/ records that were reviewed today include: None. The findings include none.    ASSESSMENT AND PLAN:  1. Coronary artery disease involving native coronary artery of native heart without angina pectoris Stable without angina pectoris. Fleeting chest discomfort is not felt to represent myocardial ischemia.  2. Erectile dysfunction, unspecified erectile dysfunction type Okay to use PD 5 inhibitor therapy. We discussed interaction with nitroglycerin.  3. Hyperlipidemia Recent reduction in atorvastatin dose by his primary physician. I have no data concerning this. He cannot explain to me why the medication was decreased.  4. Essential hypertension Excellent blood pressure control    Current medicines are reviewed at length with the patient today.  The patient has the following concerns regarding medicines: Concerned about why atorvastatin dose was decreased.  The following changes/actions have been instituted:    Obtain data from Dr. Selena Levine concerning lipids  Active lifestyle  Okay to use PD 5 inhibitor therapy  Labs/ tests ordered today include:  No orders of the defined types were placed in this encounter.     Disposition:   FU with HS in 1 year  Signed, Mark Noe, MD  05/06/2015 5:34 PM    [facility] [address] [place], [place], [place]  [address] Phone: [phone]; Fax: [phone]

## 2015-05-06 NOTE — Patient Instructions (Signed)

## 2015-05-12 ENCOUNTER — Other Ambulatory Visit: Payer: Self-pay | Admitting: Interventional Cardiology

## 2015-07-01 IMAGING — CT CT HEAD W/O CM
2 series · 16 of 30 positions shown, 18 images · non-contrast
Comparison: None.

CLINICAL DATA: Headache and dizziness

EXAM:
CT HEAD WITHOUT CONTRAST
TECHNIQUE: Contiguous axial images were obtained from the base of the skull
through the vertex without intravenous contrast.

[Series 2: head w/o · axial · non-contrast · 0.49mm/px · z∈[+140,+280]mm · 8 of 36 slices shown, 10 images]
[im 4/36  brain]
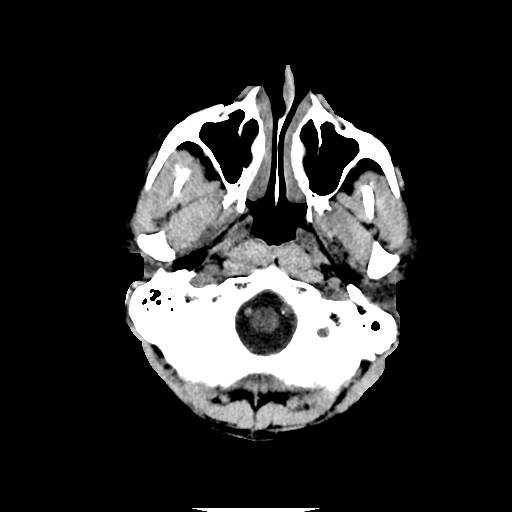
[im 4/36  bone]
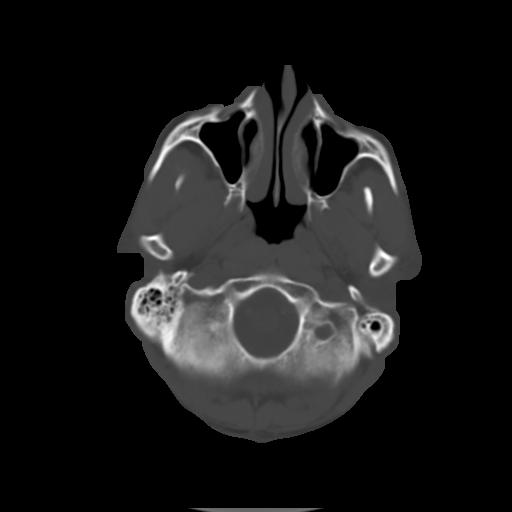
[im 8/36  brain]
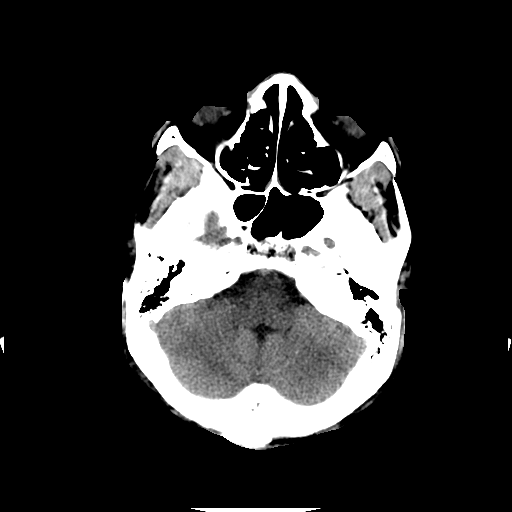
[im 12/36  brain]
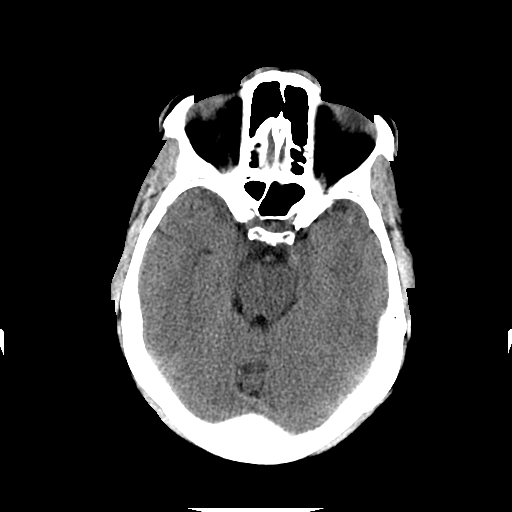
[im 16/36  brain]
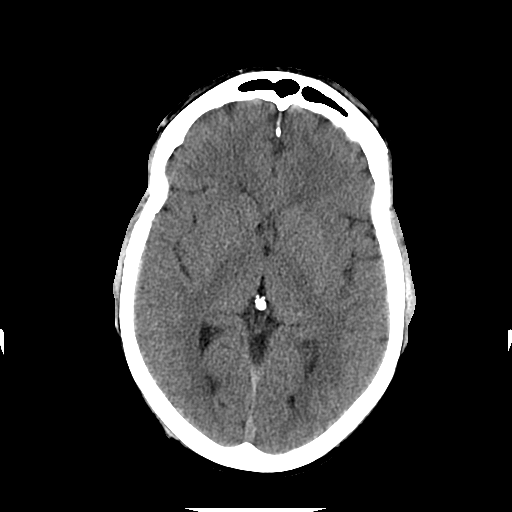
[im 20/36  brain]
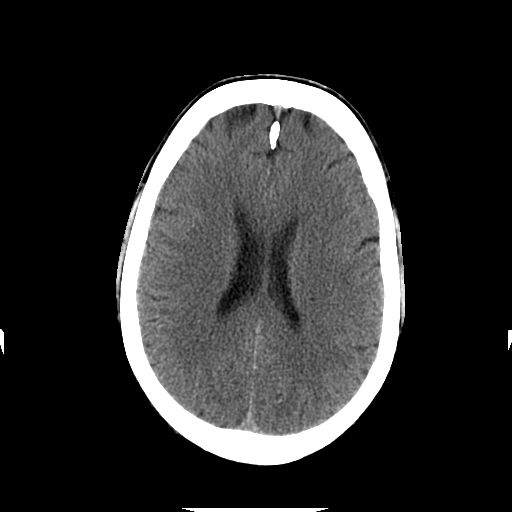
[im 20/36  bone]
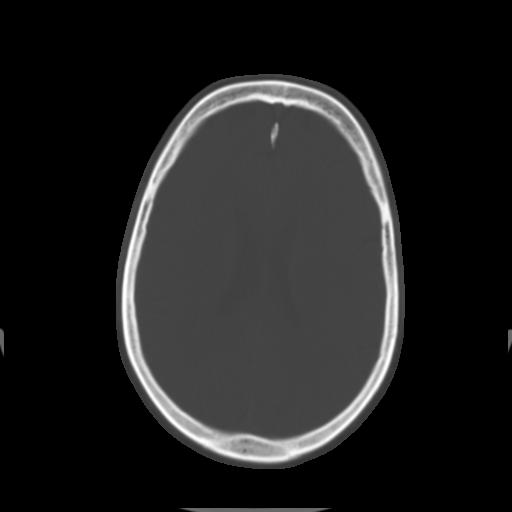
[im 24/36  brain]
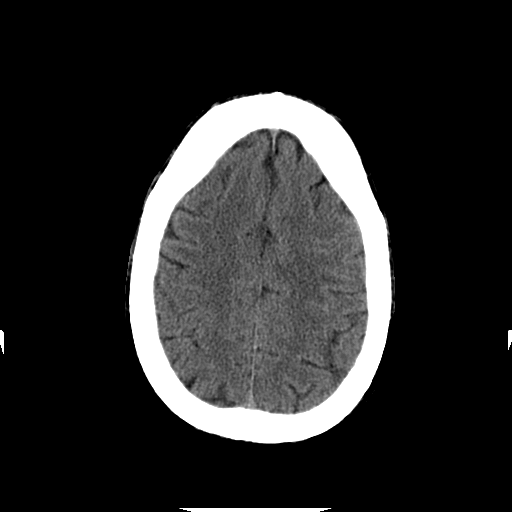
[im 28/36  brain]
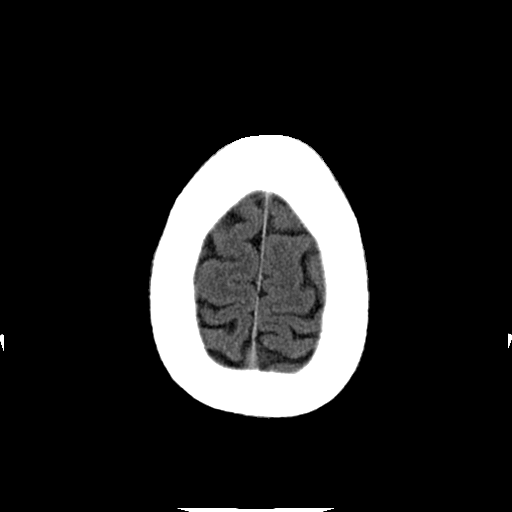
[im 32/36  brain]
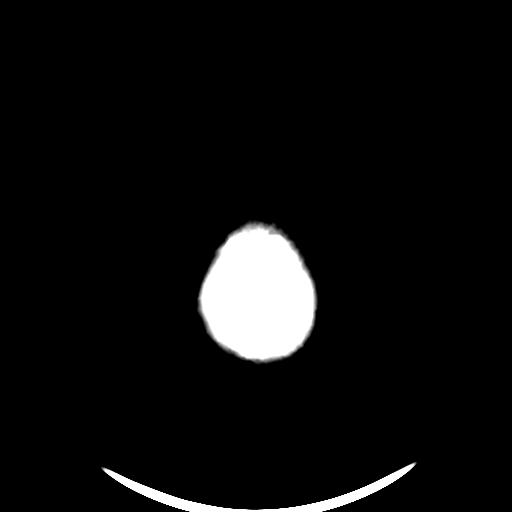

[Series 3: head w/o bone · axial · non-contrast · 0.49mm/px · z∈[+143,+280]mm · 8 of 71 slices shown]
[im 8/71  bone]
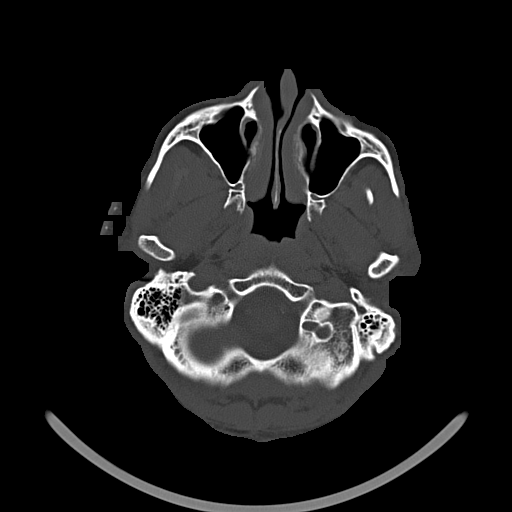
[im 15/71  bone]
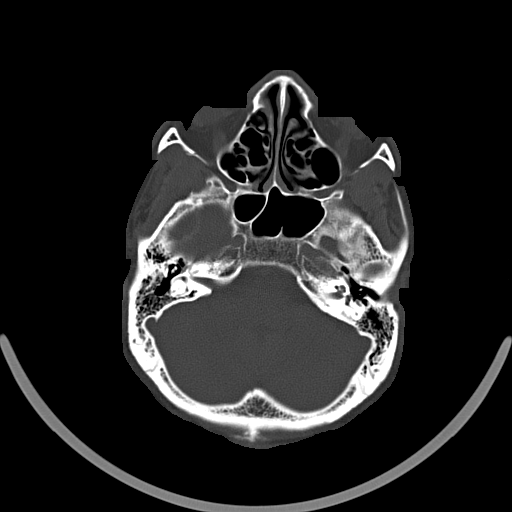
[im 23/71  bone]
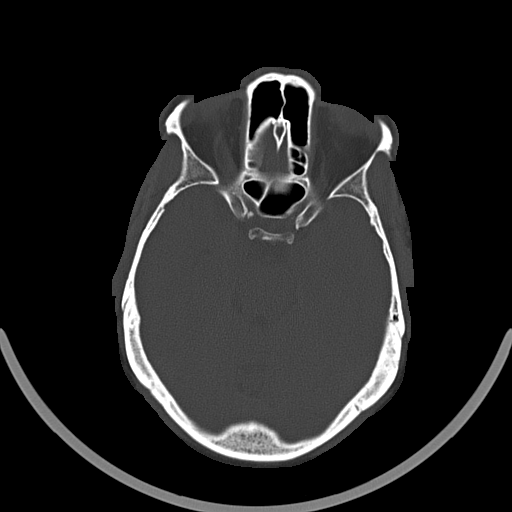
[im 30/71  bone]
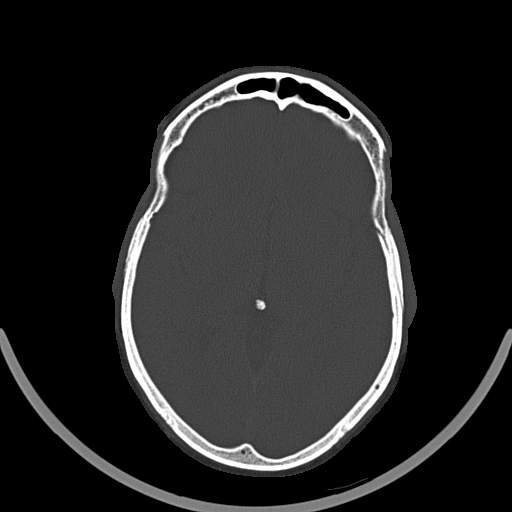
[im 41/71  bone]
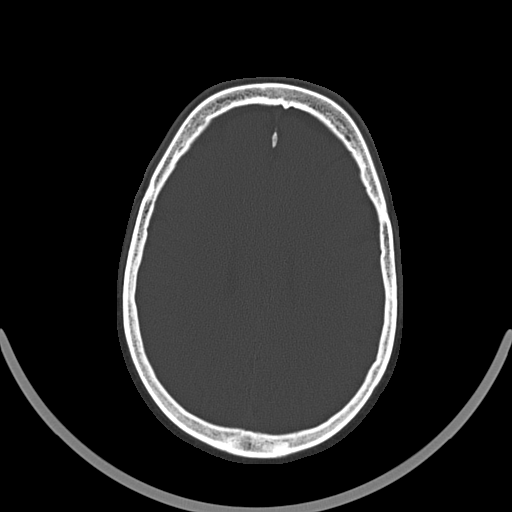
[im 48/71  bone]
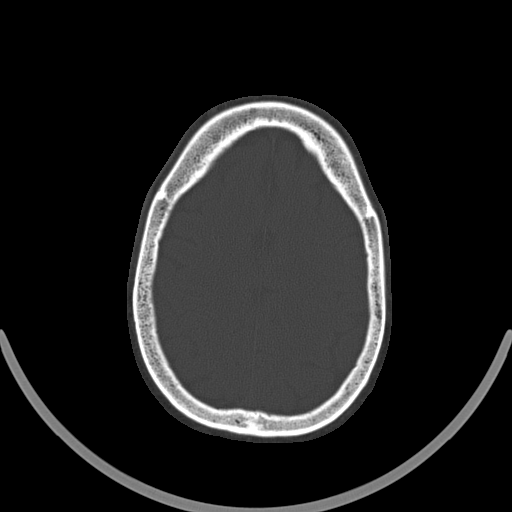
[im 56/71  bone]
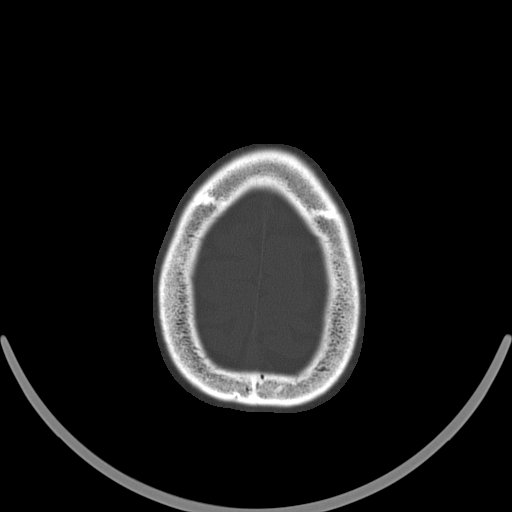
[im 63/71  bone]
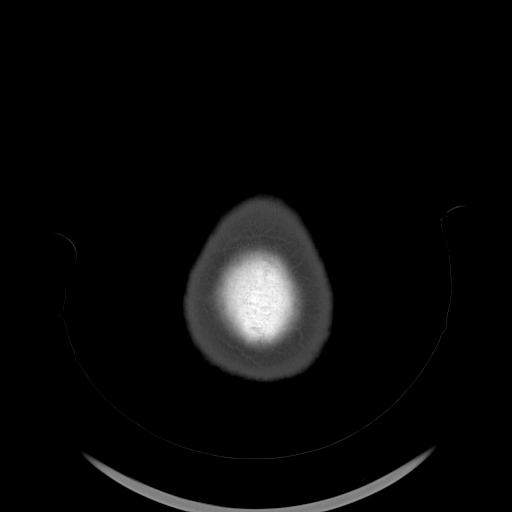

[16 of 30 positions shown; findings below may reference images not displayed]

FINDINGS: Ventricle size is normal. Negative for acute or chronic infarction.
Negative for hemorrhage or fluid collection. Negative for mass or
edema. No shift of the midline structures.

Calvarium is intact.
IMPRESSION: Normal

## 2015-07-01 IMAGING — CR DG CHEST 1V PORT SAME DAY
1 series · 1 of 1 positions shown · non-contrast
Comparison: 06/24/2009

CLINICAL DATA: Chest pain, CHF

EXAM:
PORTABLE CHEST - 1 VIEW SAME DAY

[AP]
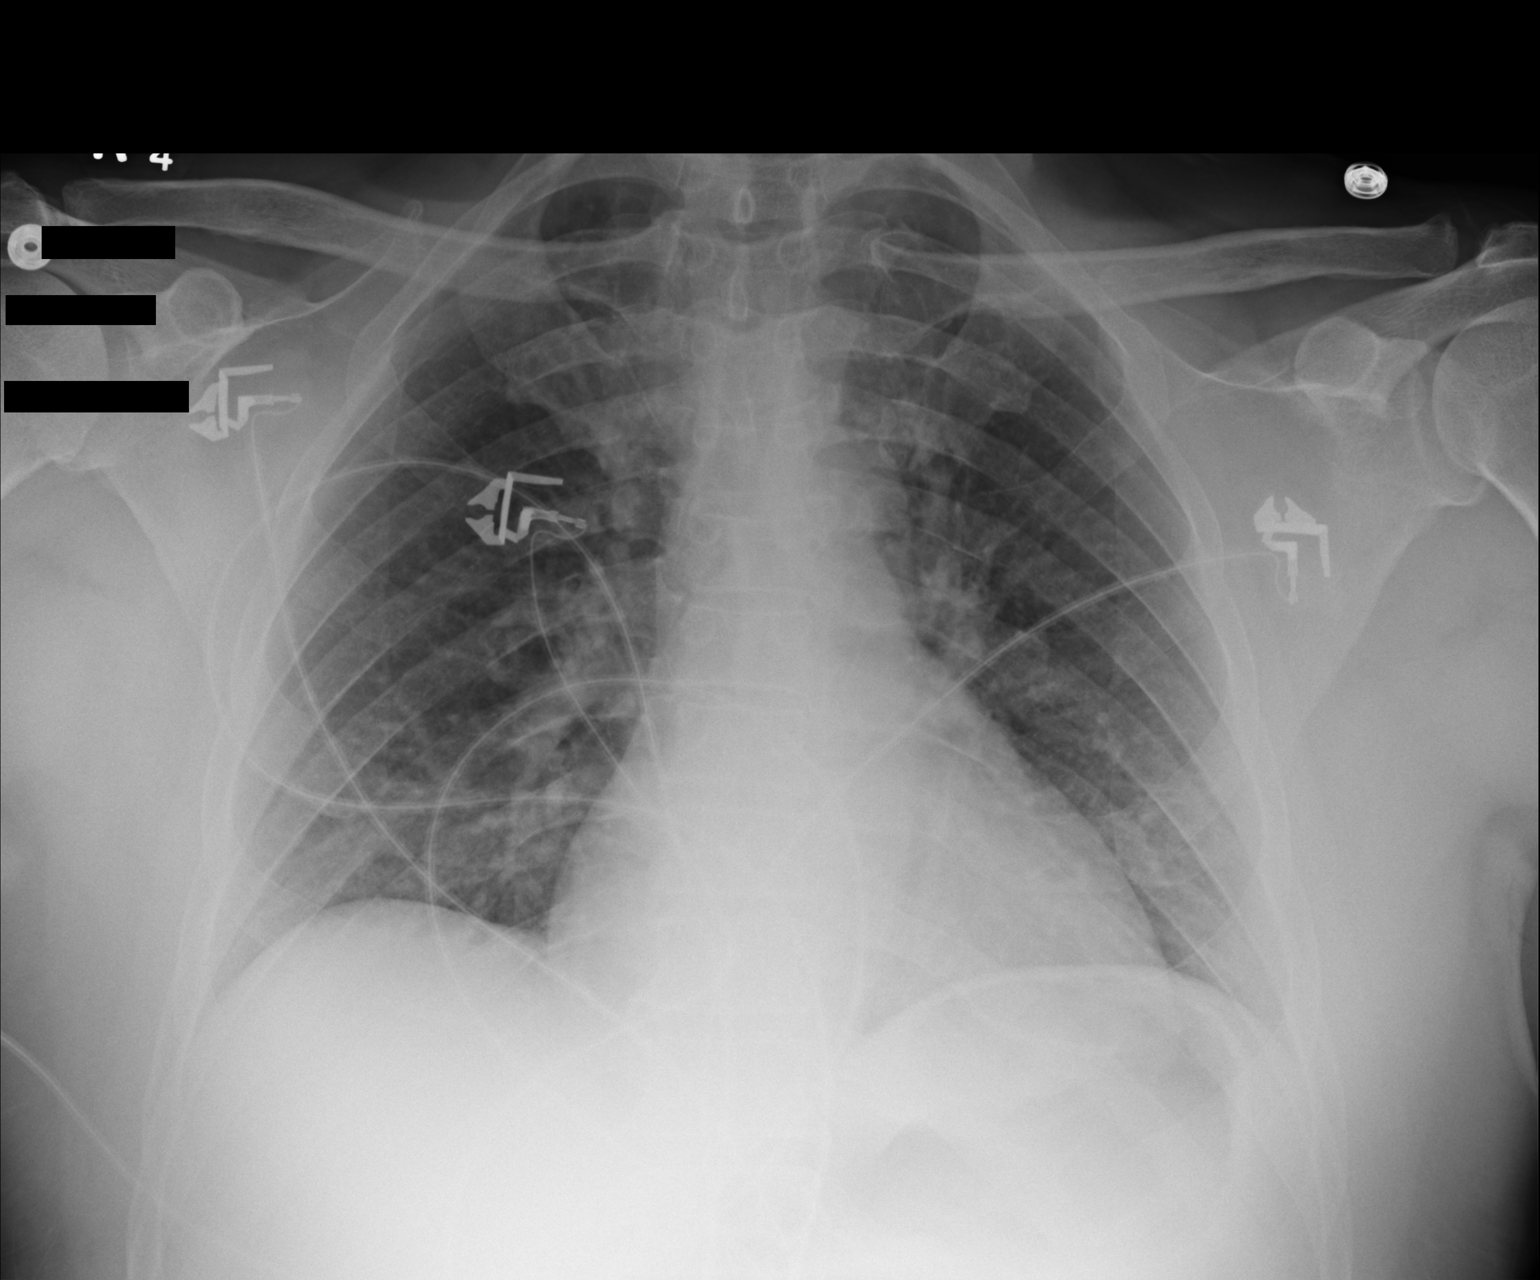

[1 of 1 positions shown; findings below may reference images not displayed]

FINDINGS: Relatively low lung volumes with resultant crowding of perihilar and
bibasilar bronchovascular structures. Can't exclude mild pulmonary
vascular congestion. No focal infiltrate or overt edema however.
Heart size upper limits normal for technique. .
No effusion.
Visualized skeletal structures are unremarkable.
IMPRESSION: Low lung volumes, possible central pulmonary vascular congestion.

## 2015-10-28 ENCOUNTER — Encounter: Payer: Self-pay | Admitting: Interventional Cardiology

## 2016-04-07 ENCOUNTER — Other Ambulatory Visit: Payer: Self-pay | Admitting: Interventional Cardiology

## 2016-05-12 ENCOUNTER — Other Ambulatory Visit: Payer: Self-pay | Admitting: Interventional Cardiology

## 2016-05-15 ENCOUNTER — Encounter: Payer: Self-pay | Admitting: Interventional Cardiology

## 2016-06-02 NOTE — Progress Notes (Signed)
Cardiology Office Note    Date:  06/03/2016   ID:  NATHANYAL ASHMEAD, DOB 1955-06-06, MRN [id number]  PCP:  Pearson Grippe, MD  Cardiologist: Lesleigh Noe, MD   Chief Complaint  Patient presents with  . Coronary Artery Disease    History of Present Illness:  Mark Levine is a 61 y.o. male who presents for CAD with DES in LAD 04/2013, hypertension, hyperlipidemia, and erectile dysfunction.  Off dual antiplatelet therapy. Plays golf regularly. No significant episodes of discomfort or dyspnea. When playing golf he walks most of the time without limitation. No palpitations. No medication side effects.   Past Medical History:  Diagnosis Date  . CAD (coronary artery disease)    a. Inf STEMI5/15 >> LHC (04/02/13): ostial left main 20-25, apical LAD 70, mid to distal RCA occluded, EF 50% inferior HK. >> PCI: Promus premier (16 x 3 mm) DES to the RCA.   Marland Kitchen History of nuclear stress test    a.Myoview 1/17: EF 48%, apical and apical lateral scar, no ischemia; Intermediate Risk (low EF)  . Hyperlipidemia     Past Surgical History:  Procedure Laterality Date  . LEFT HEART CATHETERIZATION WITH CORONARY ANGIOGRAM N/A 04/02/2013   Procedure: LEFT HEART CATHETERIZATION WITH CORONARY ANGIOGRAM;  Surgeon: Lesleigh Noe, MD;  Location: [facility] CATH LAB;  Service: Cardiovascular;  Laterality: N/A;  . NO PAST SURGERIES      Current Medications: Outpatient Medications Prior to Visit  Medication Sig Dispense Refill  . acetaminophen-codeine (TYLENOL #3) 300-30 MG per tablet Take 1 tablet by mouth every 4 (four) hours as needed for moderate pain.    Marland Kitchen ALPRAZolam (XANAX) 0.25 MG tablet Take 0.25 mg by mouth 3 (three) times daily as needed. (anxiety)  0  . aspirin EC 81 MG EC tablet Take 1 tablet (81 mg total) by mouth daily.    Marland Kitchen atorvastatin (LIPITOR) 40 MG tablet TAKE 1 TABLET BY MOUTH DAILY AT 6:00PM (Patient taking differently: TAKE 1/2 TABLET BY MOUTH DAILY AT 6:00PM) 30 tablet 5  . metoprolol  tartrate (LOPRESSOR) 25 MG tablet TAKE 1 TABLET (25 MG TOTAL) BY MOUTH 2 (TWO) TIMES DAILY. 60 tablet 0  . nitroGLYCERIN (NITROSTAT) 0.4 MG SL tablet Place 1 tablet (0.4 mg total) under the tongue every 5 (five) minutes x 3 doses as needed for chest pain. 25 tablet 2  . VIAGRA 100 MG tablet Take 100 mg by mouth daily as needed for erectile dysfunction.   1  . atorvastatin (LIPITOR) 40 MG tablet Take 20 mg by mouth daily.     No facility-administered medications prior to visit.      Allergies:   Shellfish allergy   Social History   Social History  . Marital status: Married    Spouse name: N/A  . Number of children: N/A  . Years of education: N/A   Social History Main Topics  . Smoking status: Never Smoker  . Smokeless tobacco: Never Used  . Alcohol use No  . Drug use: No  . Sexual activity: Not Asked   Other Topics Concern  . None   Social History Narrative  . None     Family History:  The patient's family history includes Other in his father.   ROS:   Please see the history of present illness.    No complaints.  All other systems reviewed and are negative.   PHYSICAL EXAM:   VS:  BP 132/82   Pulse 69  Ht 6' 0.6" (1.844 m)   Wt 222 lb (100.7 kg)   BMI 29.61 kg/m    GEN: Well nourished, well developed, in no acute distress  HEENT: normal  Neck: no JVD, carotid bruits, or masses Cardiac: RRR; no murmurs, rubs, or gallops,no edema  Respiratory:  clear to auscultation bilaterally, normal work of breathing GI: soft, nontender, nondistended, + BS MS: no deformity or atrophy  Skin: warm and dry, no rash Neuro:  Alert and Oriented x 3, Strength and sensation are intact Psych: euthymic mood, full affect  Wt Readings from Last 3 Encounters:  06/03/16 222 lb (100.7 kg)  05/06/15 220 lb 6.4 oz (100 kg)  02/06/15 225 lb (102.1 kg)      Studies/Labs Reviewed:   EKG:  EKG  Normal sinus rhythm, nonspecific T wave flattening, and no change when compared to prior  tracings.   Recent Labs: No results found for requested labs within last 8760 hours.   Lipid Panel    Component Value Date/Time   CHOL 115 (L) 02/15/2015 0737   TRIG 65 02/15/2015 0737   HDL 40 02/15/2015 0737   CHOLHDL 2.9 02/15/2015 0737   VLDL 13 02/15/2015 0737   LDLCALC 62 02/15/2015 0737    Additional studies/ records that were reviewed today include:  none    ASSESSMENT:    1. Coronary artery disease involving native coronary artery of native heart without angina pectoris   2. Essential hypertension   3. MI, old   4. Hyperlipidemia, unspecified hyperlipidemia type      PLAN:  In order of problems listed above: 1. Doing well. Status post left anterior descending DES. Now off dual antiplatelet therapy. No symptoms. 2. 2 g sodium diet 3. No palpitations or symptoms to suggest heart failure. 4. Followed by primary care. LDL target less than 70.  Active lifestyle with aerobic activity. Clinical follow-up in one year. No change in medical regimen.    Medication Adjustments/Labs and Tests Ordered: Current medicines are reviewed at length with the patient today.  Concerns regarding medicines are outlined above.  Medication changes, Labs and Tests ordered today are listed in the Patient Instructions below. Patient Instructions  Medication Instructions:  None  Labwork: None  Testing/Procedures: None  Follow-Up: Your physician wants you to follow-up in: 1 year with Dr. Katrinka Blazing.  You will receive a reminder letter in the mail two months in advance. If you don't receive a letter, please call our office to schedule the follow-up appointment.   Any Other Special Instructions Will Be Listed Below (If Applicable).     If you need a refill on your cardiac medications before your next appointment, please call your pharmacy.      Signed, Lesleigh Noe, MD  06/03/2016 5:00 PM    [facility] [address] [place], [place], [place]   [address] Phone: [phone]; Fax: [phone]

## 2016-06-03 ENCOUNTER — Encounter: Payer: Self-pay | Admitting: Interventional Cardiology

## 2016-06-03 ENCOUNTER — Ambulatory Visit (INDEPENDENT_AMBULATORY_CARE_PROVIDER_SITE_OTHER): Payer: BC Managed Care – PPO | Admitting: Interventional Cardiology

## 2016-06-03 VITALS — BP 132/82 | HR 69 | Ht 72.6 in | Wt 222.0 lb

## 2016-06-03 DIAGNOSIS — I2510 Atherosclerotic heart disease of native coronary artery without angina pectoris: Secondary | ICD-10-CM | POA: Diagnosis not present

## 2016-06-03 DIAGNOSIS — E785 Hyperlipidemia, unspecified: Secondary | ICD-10-CM

## 2016-06-03 DIAGNOSIS — I10 Essential (primary) hypertension: Secondary | ICD-10-CM

## 2016-06-03 DIAGNOSIS — I252 Old myocardial infarction: Secondary | ICD-10-CM | POA: Diagnosis not present

## 2016-06-03 NOTE — Patient Instructions (Signed)

## 2016-06-09 ENCOUNTER — Other Ambulatory Visit: Payer: Self-pay | Admitting: Interventional Cardiology

## 2016-12-07 ENCOUNTER — Other Ambulatory Visit: Payer: Self-pay | Admitting: Interventional Cardiology

## 2017-06-16 ENCOUNTER — Other Ambulatory Visit: Payer: Self-pay | Admitting: Interventional Cardiology

## 2017-08-27 ENCOUNTER — Other Ambulatory Visit: Payer: Self-pay

## 2017-08-27 MED ORDER — METOPROLOL TARTRATE 25 MG PO TABS: 25.0000 mg | ORAL_TABLET | Freq: Two times a day (BID) | ORAL | 0 refills | Status: DC

## 2017-08-31 ENCOUNTER — Encounter: Payer: Self-pay | Admitting: Nurse Practitioner

## 2017-08-31 ENCOUNTER — Encounter (INDEPENDENT_AMBULATORY_CARE_PROVIDER_SITE_OTHER): Payer: Self-pay

## 2017-08-31 ENCOUNTER — Ambulatory Visit: Payer: BC Managed Care – PPO | Admitting: Nurse Practitioner

## 2017-08-31 VITALS — BP 150/80 | HR 64 | Ht 72.0 in | Wt 224.8 lb

## 2017-08-31 DIAGNOSIS — I2510 Atherosclerotic heart disease of native coronary artery without angina pectoris: Secondary | ICD-10-CM

## 2017-08-31 MED ORDER — METOPROLOL TARTRATE 25 MG PO TABS: 25.0000 mg | ORAL_TABLET | Freq: Two times a day (BID) | ORAL | 12 refills | Status: DC

## 2017-08-31 MED ORDER — ATORVASTATIN CALCIUM 40 MG PO TABS: ORAL_TABLET | ORAL | 12 refills | Status: DC

## 2017-08-31 NOTE — Progress Notes (Signed)
CARDIOLOGY OFFICE NOTE  Date:  08/31/2017    [name] Date of Birth: 1955/09/21 Medical Record #[id number]  PCP:  Pearson Grippe, MD  Cardiologist:  Kyra Manges    Chief Complaint  Patient presents with  . Coronary Artery Disease    Follow up visit - seen for Dr. Katrinka Blazing    History of Present Illness: Mark Levine is a 62 y.o. male who presents today for a follow up visit. Seen for Dr. Katrinka Blazing.   He has known CAD with prior DES to the LAD in 2015, HTN, HLD and ED.   Last seen in May of 2018 - was doing well. No longer on DAPT.   Comes in today. Here alone. He feels good. No chest pain. Not short of breath. Quite active. Still golfing. He has had labs from April noted. Needs his medicines refilled. Overall, he has no real concerns.   Past Medical History:  Diagnosis Date  . CAD (coronary artery disease)    a. Inf STEMI5/15 >> LHC (04/02/13): ostial left main 20-25, apical LAD 70, mid to distal RCA occluded, EF 50% inferior HK. >> PCI: Promus premier (16 x 3 mm) DES to the RCA.   Marland Kitchen CAD (coronary artery disease), native coronary artery 05/10/2014   DES RCA during inferior STEMI   . Cough due to ACE inhibitor 05/11/2014   Lisinopril discontinued 05/11/14   . Erectile dysfunction 10/11/2013  . Essential hypertension 05/10/2014  . History of nuclear stress test    a.Myoview 1/17: EF 48%, apical and apical lateral scar, no ischemia; Intermediate Risk (low EF)  . Hyperlipidemia   . 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 LEFT HEART CATHETERIZATION WITH CORONARY ANGIOGRAM N/A 04/02/2013   Procedure: LEFT HEART CATHETERIZATION WITH CORONARY ANGIOGRAM;  Surgeon: Lesleigh Noe, MD;  Location: [facility] CATH LAB;  Service: Cardiovascular;  Laterality: N/A;  . NO PAST SURGERIES       Medications: No outpatient medications have been marked as taking for the 08/31/17 encounter (Office Visit) with Rosalio Macadamia, NP.      Allergies: Allergies  Allergen Reactions  . Other Swelling    All nuts " throw swelling"  . Shellfish Allergy Hives    Social History: The patient  reports that he has never smoked. He has never used smokeless tobacco. He reports that he does not drink alcohol or use drugs.   Family History: The patient's family history includes Other in his father.   Review of Systems: Please see the history of present illness.   Otherwise, the review of systems is positive for none.   All other systems are reviewed and negative.   Physical Exam: VS:  BP (!) 150/80 (BP Location: Left Arm, Patient Position: Sitting, Cuff Size: Normal)   Pulse 64   Ht 6' (1.829 m)   Wt 224 lb 12.8 oz (102 kg)   BMI 30.49 kg/m  .  BMI Body mass index is 30.49 kg/m.  Wt Readings from Last 3 Encounters:  08/31/17 224 lb 12.8 oz (102 kg)  06/03/16 222 lb (100.7 kg)  05/06/15 220 lb 6.4 oz (100 kg)   BP is 135/90 by me General: Pleasant. Well developed, well nourished and in no acute distress.   HEENT: Normal.  Neck: Supple, no JVD, carotid bruits, or masses noted.  Cardiac: Regular rate and rhythm. No murmurs, rubs, or gallops.  No edema.  Respiratory:  Lungs are clear to auscultation bilaterally with normal work of breathing.  GI: Soft and nontender.  MS: No deformity or atrophy. Gait and ROM intact.  Skin: Warm and dry. Color is normal.  Neuro:  Strength and sensation are intact and no gross focal deficits noted.  Psych: Alert, appropriate and with normal affect.   LABORATORY DATA:  EKG:  EKG is ordered today. This demonstrates NSR.  Lab Results  Component Value Date   WBC 4.9 02/27/2014   HGB 14.7 02/27/2014   HCT 43.7 02/27/2014   PLT 141 (L) 02/27/2014   GLUCOSE 118 (H) 02/27/2014   CHOL 115 (L) 02/15/2015   TRIG 65 02/15/2015   HDL 40 02/15/2015   LDLCALC 62 02/15/2015   ALT 55 (H) 03/22/2015   AST 37 (H) 03/22/2015   NA 139 02/27/2014   K 3.5 02/27/2014   CL 107 02/27/2014    CREATININE 1.06 02/27/2014   BUN 18 02/27/2014   CO2 27 02/27/2014   TSH 0.614 04/02/2013   INR 0.90 04/02/2013   HGBA1C 5.7 (H) 04/02/2013       BNP (last 3 results) No results for input(s): BNP in the last 8760 hours.  ProBNP (last 3 results) No results for input(s): PROBNP in the last 8760 hours.   Other Studies Reviewed Today:  Myoview Study Highlights 2017   Nuclear stress EF: 48%.  Horizontal ST segment depression ST segment depression of 4 mm was noted during stress in the II, III, aVF, V6, V5 and V4 leads, beginning at 3 minutes of stress, and returning to baseline after 1-5 minutes of recovery.  No T wave inversion was noted during stress.  Defect 1: There is a small defect of mild severity.  Findings consistent with prior myocardial infarction.  This is a low risk study.   Small-size, mild intensity fixed apical/apical lateral perfusion defect with associated hypokinesis. This is most likely a small area of scar. LVEF 48%. This is an intermediate risk study, due to moderate LV dysfunction. There is no reversible ischemia.   Notes Recorded by Lyn Records, MD on 02/18/2015 at 3:57 PM I would not be too concerned about the apical lateral defect as read. There is no ischemia. Nothing further unless recurrent chest pain.  Assessment/Plan:  1. CAD - no active chest pain. Doing well clinically. No changes made today. Meds refilled today.   2. HTN - BP recheck by me is a little better - I have asked him to monitor - he does eat out on the weekend - may have too much salt. He will monitor for [place] and let us know if he is not at goal.   3. HLD - on statin - labs from April noted.   4. ED   Current medicines are reviewed with the patient today.  The patient does not have concerns regarding medicines other than what has been noted above.  The following changes have been made:  See above.  Labs/ tests ordered today include:    Orders Placed This Encounter    Procedures  . EKG 12-Lead     Disposition:   FU with Dr. Katrinka Blazing in one year.     Patient is agreeable to this plan and will call if any problems develop in the interim.   SignedNorma Fredrickson, NP  08/31/2017 4:00 PM  [facility] [address] [place], [place]  [address] Phone: [phone] Fax: [phone]

## 2017-08-31 NOTE — Patient Instructions (Addendum)
We will be checking the following labs today - NONE   Medication Instructions:    Continue with your current medicines.   I sent in your refills    Testing/Procedures To Be Arranged:  N/A  Follow-Up:   See Dr. Katrinka Blazing in one year.     Other Special Instructions:   Please monitor your BP for [place] - goal is 130/85 or less.     If you need a refill on your cardiac medications before your next appointment, please call your pharmacy.   Call the [facility] office at [phone] if you have any questions, problems or concerns.

## 2018-03-11 ENCOUNTER — Telehealth: Payer: Self-pay | Admitting: Interventional Cardiology

## 2018-03-11 NOTE — Telephone Encounter (Signed)
°  Pt c/o of Chest Pain: STAT if CP now or developed within 24 hours  1. Are you having CP right now? No, maybe  " a little" per patient   2. Are you experiencing any other symptoms (ex. SOB, nausea, vomiting, sweating)? Dizziness, headache 3. How long have you been experiencing CP? 2 days  4. Is your CP continuous or coming and going? Coming and going  5. Have you taken Nitroglycerin? No ?

## 2018-03-11 NOTE — Telephone Encounter (Signed)
Returned call to patient. He states that for the past 2 days he has been swimmy headed and has had intermittent episodes of tightness in the center of his chest that happen at rest. He states that this typically happens after he has a meal or eats something sweet. He denies having any SOB, lightheadedness, nausea, vomiting, or any other Sx. He denies NTG use. He does not have any vitals to offer. Advised patient to follow up with PCP and let us know if his Sx change or worsen. Patient verbalized understanding and thanked me for the call.

## 2018-04-14 ENCOUNTER — Telehealth: Payer: Self-pay | Admitting: Interventional Cardiology

## 2018-04-14 NOTE — Telephone Encounter (Signed)
Spoke with Dr. Katrinka Blazing and he said pt did not need antibiotics prior to dental work and ok to use tramadol for pain after.  Spoke with pt and made him aware of recommendations.  Pt states that dentist thinks he has an infection in his tooth and started him on an antibiotic.  Advised pt to finish out course of antibiotics unless dentist tells him otherwise.  Pt verbalized understanding and was appreciative for call.

## 2018-04-14 NOTE — Telephone Encounter (Signed)
New Message         Patient is calling in today to see if he can continue taking "Penicillin"  And Tramadol for a tooth extraction. Dr.Wooden (dentist) [phone].The procedure is tomorrow.  Pls call to advise.

## 2018-05-19 ENCOUNTER — Telehealth: Payer: Self-pay | Admitting: Interventional Cardiology

## 2018-05-19 NOTE — Telephone Encounter (Signed)
Spoke with the pt regarding Dr. Michaelle Copas message..the patient denies cough and URI symptoms.   I have instructed him on the proper use of SL NTG if his discomfort lasts longer than 5 minutes, which he says usually never does, but he read back the Nitro instructions to me and I strongly advised him not to use NTG at the time he is also using his Viagra. Pt says that he understands. He will call back in the next several days if anything changes and will call EMS if pain worsens and/or lasts longer than 15 minutes and 3 NTG.

## 2018-05-19 NOTE — Telephone Encounter (Signed)
New Message:   Patient states he is having some tightness in his chest and would like for some one to call him back.

## 2018-05-19 NOTE — Telephone Encounter (Signed)
Make sure no cough or URI complaints. Give instruction to use SL NTG. Encourage use and report response.  How long do episodes last? If they last > 5 minutes, should use NTG

## 2018-05-19 NOTE — Telephone Encounter (Signed)
Pt called to report that he has been having chest tightness in the upper center of his chest near his throat... It has been on and off for about 2 weeks... he says it happens at rest... 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 he has not had to take any nitro it just goes away on it's own.  He denies any other symptoms associated with it such as dizziness, radiation, sob, no cough, and no problems with swallowing and it is not noticeable before or after meals.   He was driving when he felt it this morning. But it has gone away and he feels fine now.  Advised him that I will forward to Dr. Katrinka Blazing for his review.. if anything changes or worsens to please call us back or the ER if he significantly feels worse.

## 2018-09-20 ENCOUNTER — Other Ambulatory Visit: Payer: Self-pay | Admitting: Nurse Practitioner

## 2018-09-30 ENCOUNTER — Other Ambulatory Visit: Payer: Self-pay | Admitting: Nurse Practitioner

## 2018-10-20 ENCOUNTER — Telehealth: Payer: Self-pay | Admitting: Interventional Cardiology

## 2018-10-20 NOTE — Telephone Encounter (Signed)
Left message letting pt know that Dr. Smith will not be in the office 9/29 but will be doing virtual/telephone visits instead.  Advised to call back to make arrangements for appt.  

## 2018-10-21 ENCOUNTER — Other Ambulatory Visit: Payer: Self-pay | Admitting: Nurse Practitioner

## 2018-10-31 NOTE — Telephone Encounter (Signed)
YOUR CARDIOLOGY TEAM HAS ARRANGED FOR AN E-VISIT FOR YOUR APPOINTMENT - PLEASE REVIEW IMPORTANT INFORMATION BELOW SEVERAL DAYS PRIOR TO YOUR APPOINTMENT  Due to the recent COVID-19 pandemic, we are transitioning in-person office visits to tele-medicine visits in an effort to decrease unnecessary exposure to our patients, their families, and staff. These visits are billed to your insurance just like a normal visit is. We also encourage you to sign up for MyChart if you have not already done so. You will need a smartphone if possible. For patients that do not have this, we can still complete the visit using a regular telephone but do prefer a smartphone to enable video when possible. You may have a family member that lives with you that can help. If possible, we also ask that you have a blood pressure cuff and scale at home to measure your blood pressure, heart rate and weight prior to your scheduled appointment. Patients with clinical needs that need an in-person evaluation and testing will still be able to come to the office if absolutely necessary. If you have any questions, feel free to call our office. MEDS REVIEWED     YOUR PROVIDER WILL BE USING THE FOLLOWING PLATFORM TO COMPLETE YOUR VISIT: Doximity   . IF USING MYCHART - How to Download the MyChart App to Your SmartPhone   - If Apple, go to App Store and type in MyChart in the search bar and download the app. If Android, ask patient to go to Google Play Store and type in MyChart in the search bar and download the app. The app is free but as with any other app downloads, your phone may require you to verify saved payment information or Apple/Android password.  - You will need to then log into the app with your MyChart username and password, and select [facility] as your healthcare provider to link the account.  - When it is time for your visit, go to the MyChart app, find appointments, and click Begin Video Visit. Be sure to Select Allow for your  device to access the Microphone and Camera for your visit. You will then be connected, and your provider will be with you shortly.  **If you have any issues connecting or need assistance, please contact MyChart service desk [phone] ([phone])**  **If using a computer, in order to ensure the best quality for your visit, you will need to use either of the following Internet Browsers: Google Chrome or Microsoft Edge**  . IF USING DOXIMITY or [email or web address].ME - The staff will give you instructions on receiving your link to join the meeting the day of your visit.      2-3 DAYS BEFORE YOUR APPOINTMENT  You will receive a telephone call from one of our [facility] team members - your caller ID may say "Unknown caller." If this is a video visit, we will walk you through how to get the video launched on your phone. We will remind you check your blood pressure, heart rate and weight prior to your scheduled appointment. If you have an Apple Watch or Kardia, please upload any pertinent ECG strips the day before or morning of your appointment to MyChart. Our staff will also make sure you have reviewed the consent and agree to move forward with your scheduled tele-health visit.     THE DAY OF YOUR APPOINTMENT  Approximately 15 minutes prior to your scheduled appointment, you will receive a telephone call from one of HeartCare team - your caller ID may say "  Unknown caller."  Our staff will confirm medications, vital signs for the day and any symptoms you may be experiencing. Please have this information available prior to the time of visit start. It may also be helpful for you to have a pad of paper and pen handy for any instructions given during your visit. They will also walk you through joining the smartphone meeting if this is a video visit.    CONSENT FOR TELE-HEALTH VISIT - PLEASE REVIEW  I hereby voluntarily request, consent and authorize [facility] and its employed or contracted physicians,  physician assistants, nurse practitioners or other licensed health care professionals (the Practitioner), to provide me with telemedicine health care services (the "Services") as deemed necessary by the treating Practitioner. I acknowledge and consent to receive the Services by the Practitioner via telemedicine. I understand that the telemedicine visit will involve communicating with the Practitioner through live audiovisual communication technology and the disclosure of certain medical information by electronic transmission. I acknowledge that I have been given the opportunity to request an in-person assessment or other available alternative prior to the telemedicine visit and am voluntarily participating in the telemedicine visit.  I understand that I have the right to withhold or withdraw my consent to the use of telemedicine in the course of my care at any time, without affecting my right to future care or treatment, and that the Practitioner or I may terminate the telemedicine visit at any time. I understand that I have the right to inspect all information obtained and/or recorded in the course of the telemedicine visit and may receive copies of available information for a reasonable fee.  I understand that some of the potential risks of receiving the Services via telemedicine include:  . Delay or interruption in medical evaluation due to technological equipment failure or disruption; . Information transmitted may not be sufficient (e.g. poor resolution of images) to allow for appropriate medical decision making by the Practitioner; and/or  . In rare instances, security protocols could fail, causing a breach of personal health information.  Furthermore, I acknowledge that it is my responsibility to provide information about my medical history, conditions and care that is complete and accurate to the best of my ability. I acknowledge that Practitioner's advice, recommendations, and/or decision may be  based on factors not within their control, such as incomplete or inaccurate data provided by me or distortions of diagnostic images or specimens that may result from electronic transmissions. I understand that the practice of medicine is not an exact science and that Practitioner makes no warranties or guarantees regarding treatment outcomes. I acknowledge that I will receive a copy of this consent concurrently upon execution via email to the email address I last provided but may also request a printed copy by calling the office of [facility].    I understand that my insurance will be billed for this visit.   I have read or had this consent read to me. . I understand the contents of this consent, which adequately explains the benefits and risks of the Services being provided via telemedicine.  . I have been provided ample opportunity to ask questions regarding this consent and the Services and have had my questions answered to my satisfaction. . I give my informed consent for the services to be provided through the use of telemedicine in my medical care  By participating in this telemedicine visit I agree to the above.  

## 2018-10-31 NOTE — Progress Notes (Signed)
n

## 2018-11-01 ENCOUNTER — Encounter: Payer: Self-pay | Admitting: Interventional Cardiology

## 2018-11-01 ENCOUNTER — Telehealth (INDEPENDENT_AMBULATORY_CARE_PROVIDER_SITE_OTHER): Payer: BC Managed Care – PPO | Admitting: Interventional Cardiology

## 2018-11-01 ENCOUNTER — Other Ambulatory Visit: Payer: Self-pay

## 2018-11-01 VITALS — BP 137/83 | Ht 73.0 in | Wt 224.0 lb

## 2018-11-01 DIAGNOSIS — E782 Mixed hyperlipidemia: Secondary | ICD-10-CM

## 2018-11-01 DIAGNOSIS — N5201 Erectile dysfunction due to arterial insufficiency: Secondary | ICD-10-CM

## 2018-11-01 DIAGNOSIS — I2510 Atherosclerotic heart disease of native coronary artery without angina pectoris: Secondary | ICD-10-CM

## 2018-11-01 DIAGNOSIS — I10 Essential (primary) hypertension: Secondary | ICD-10-CM

## 2018-11-01 DIAGNOSIS — Z7189 Other specified counseling: Secondary | ICD-10-CM

## 2018-11-01 NOTE — Progress Notes (Signed)
Virtual Visit via Video Note   This visit type was conducted due to national recommendations for restrictions regarding the COVID-19 Pandemic (e.g. social distancing) in an effort to limit this patient's exposure and mitigate transmission in our community.  Due to his co-morbid illnesses, this patient is at least at moderate risk for complications without adequate follow up.  This format is felt to be most appropriate for this patient at this time.  All issues noted in this document were discussed and addressed.  A limited physical exam was performed with this format.  Please refer to the patient's chart for his consent to telehealth for [facility].   Date:  11/01/2018   ID:  Mark Levine, DOB Aug 03, 1955, MRN [id number]  Patient Location: Home Provider Location: Home  PCP:  [name], MD [facility] Cardiologist:  No primary care provider on file.  Electrophysiologist:  None   Evaluation Performed:  Follow-Up Visit  Chief Complaint:  CAD  History of Present Illness:    Mark Levine is a 63 y.o. male with  with a hx of DES to the LAD in 2015, HTN, HLD and ED.   He is doing well.  Achieving greater than 150 minutes of moderate activity per week.  He has not had angina.  He and his wife golf together.  They play 9 holes at least 3 times per week.  He has not had orthopnea, PND, lower extremity swelling, palpitations, or syncope.  The patient does not have symptoms concerning for COVID-19 infection (fever, chills, cough, or new shortness of breath).    Past Medical History:  Diagnosis Date  . CAD (coronary artery disease)    a. Inf STEMI5/15 >> LHC (04/02/13): ostial left main 20-25, apical LAD 70, mid to distal RCA occluded, EF 50% inferior HK. >> PCI: Promus premier (16 x 3 mm) DES to the RCA.   Marland Kitchen CAD (coronary artery disease), native coronary artery 05/10/2014   DES RCA during inferior STEMI   . Cough due to ACE inhibitor 05/11/2014   Lisinopril discontinued 05/11/14   . Erectile  dysfunction 10/11/2013  . Essential hypertension 05/10/2014  . History of nuclear stress test    a.Myoview 1/17: EF 48%, apical and apical lateral scar, no ischemia; Intermediate Risk (low EF)  . Hyperlipidemia   . 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 LEFT HEART CATHETERIZATION WITH CORONARY ANGIOGRAM N/A 04/02/2013   Procedure: LEFT HEART CATHETERIZATION WITH CORONARY ANGIOGRAM;  Surgeon: Sinclair Grooms, MD;  [facility];  Service: Cardiovascular;  Laterality: N/A;  . NO PAST SURGERIES       No outpatient medications have been marked as taking for the 11/01/18 encounter (Telemedicine) with Belva Crome, MD.     Allergies:   Other and Shellfish allergy   Social History   Tobacco Use  . Smoking status: Never Smoker  . Smokeless tobacco: Never Used  Substance Use Topics  . Alcohol use: No    Alcohol/week: 0.0 standard drinks  . Drug use: No     Family Hx: The patient's family history includes Other in his father.  ROS:   Please see the history of present illness.    If he eats sweets, he gets somewhat shaky.  He has gained some weight.  He has no concerns. All other systems reviewed and are negative.   Prior CV studies:   The following studies were  reviewed today:  No recent CV studies  Labs/Other Tests and Data Reviewed:    EKG:  No ECG reviewed.  Recent Labs: No results found for requested labs within last 8760 hours.   Recent Lipid Panel Lab Results  Component Value Date/Time   CHOL 115 (L) 02/15/2015 07:37 AM   TRIG 65 02/15/2015 07:37 AM   HDL 40 02/15/2015 07:37 AM   CHOLHDL 2.9 02/15/2015 07:37 AM   LDLCALC 62 02/15/2015 07:37 AM    Wt Readings from Last 3 Encounters:  11/01/18 224 lb (101.6 kg)  08/31/17 224 lb 12.8 oz (102 kg)  06/03/16 222 lb (100.7 kg)     Objective:    Vital Signs:  BP 137/83   Ht 6\' 1"  (1.854 m)   Wt 224 lb (101.6 kg)   BMI 29.55 kg/m    VITAL  SIGNS:  reviewed  ASSESSMENT & PLAN:    1. Coronary artery disease involving native coronary artery of native heart without angina pectoris   2. Erectile dysfunction due to arterial insufficiency   3. Essential hypertension   4. Mixed hyperlipidemia   5. Educated About Covid-19 Virus Infection    PLAN:  1. Secondary prevention was discussed.  He is not having angina. 2. We did not discuss erectile dysfunction 3. The blood pressure is slightly elevated today but we did not make adjustments in therapy.  I did reeducate concerning the importance of salt restriction. 4. LDL/metabolic risk factors are being followed by primary care.  We will request the most recent labs to ensure that targets are being hit. 5. Mask wearing, social distancing, and handwashing is stressed  Overall education and awareness concerning primary/secondary risk prevention was discussed in detail: LDL less than 70, hemoglobin A1c less than 7, blood pressure target less than 130/80 mmHg, >150 minutes of moderate aerobic activity per week, avoidance of smoking, weight control (via diet and exercise), and continued surveillance/management of/for obstructive sleep apnea.   COVID-19 Education: The signs and symptoms of COVID-19 were discussed with the patient and how to seek care for testing (follow up with PCP or arrange E-visit).  The importance of social distancing was discussed today.  Time:   Today, I have spent 18 minutes with the patient with telehealth technology discussing the above problems.     Medication Adjustments/Labs and Tests Ordered: Current medicines are reviewed at length with the patient today.  Concerns regarding medicines are outlined above.   Tests Ordered: No orders of the defined types were placed in this encounter.   Medication Changes: No orders of the defined types were placed in this encounter.   Follow Up:  In Person in 6 month(s)  Signed, , MD  11/01/2018 5:05 PM     [facility]

## 2018-11-01 NOTE — Patient Instructions (Signed)
Medication Instructions:  Your physician recommends that you continue on your current medications as directed. Please refer to the Current Medication list given to you today.  If you need a refill on your cardiac medications before your next appointment, please call your pharmacy.   Lab work: None If you have labs (blood work) drawn today and your tests are completely normal, you will receive your results only by: Marland Kitchen MyChart Message (if you have MyChart) OR . A paper copy in the mail If you have any lab test that is abnormal or we need to change your treatment, we will call you to review the results.  Testing/Procedures: None  Follow-Up: At [facility], you and your health needs are our priority.  As part of our continuing mission to provide you with exceptional heart care, we have created designated Provider Care Teams.  These Care Teams include your primary Cardiologist (physician) and Advanced Practice Providers (APPs -  Physician Assistants and Nurse Practitioners) who all work together to provide you with the care you need, when you need it. You will need a follow up appointment in 6-8 months.  Please call our office 2 months in advance to schedule this appointment.  You may see Daneen Schick, MD or one of the following Advanced Practice Providers on your designated Care Team:   Truitt Merle, NP Cecilie Kicks, NP . Kathyrn Drown, NP  Any Other Special Instructions Will Be Listed Below (If Applicable).

## 2019-01-17 ENCOUNTER — Other Ambulatory Visit: Payer: Self-pay | Admitting: Nurse Practitioner

## 2019-01-20 ENCOUNTER — Other Ambulatory Visit: Payer: Self-pay | Admitting: Interventional Cardiology

## 2019-03-03 ENCOUNTER — Telehealth: Payer: Self-pay | Admitting: Interventional Cardiology

## 2019-03-03 NOTE — Telephone Encounter (Signed)
Pt with intermittent CP for awhile.  Last night seemed to be worse than usual and pt concerned.  Occasionally has a HA with it but no other issues.  No vitals available.  First time this occurred pt thought it might be related to food because he had 2 classic chicken sandwiches from [employer] [facility] and he had not had that in awhile and it was full of salt.  Has continued to have issues since then.  Last nights dinner was [place] with pimento cheese sandwich and a baked sweet potato.  No seasonings.  Moved appt up to 2/1 with Nada Boozer, NP.  Advised when appropriate to present at ER.  Pt verbalized understanding and was appreciative for call.

## 2019-03-03 NOTE — Telephone Encounter (Signed)
Pt c/o of Chest Pain: STAT if CP now or developed within 24 hours  1. Are you having CP right now? no  2. Are you experiencing any other symptoms (ex. SOB, nausea, vomiting, sweating)? Slight headache two days ago, but two tylenol helped relieve the headache  3. How long have you been experiencing CP? For a while now. Pt felt the worst last night  4. Is your CP continuous or coming and going? Off and on  5. Have you taken Nitroglycerin? No  Patient has been feeling off for a while and just wants to be checked out. He was scheduled over the phone to see Norma Fredrickson on Wed 02-03 at 1:45 but wanted to know if he needed to be seen sooner

## 2019-03-06 ENCOUNTER — Encounter: Payer: Self-pay | Admitting: Cardiology

## 2019-03-06 ENCOUNTER — Other Ambulatory Visit: Payer: Self-pay

## 2019-03-06 ENCOUNTER — Encounter (INDEPENDENT_AMBULATORY_CARE_PROVIDER_SITE_OTHER): Payer: Self-pay

## 2019-03-06 ENCOUNTER — Ambulatory Visit: Payer: BC Managed Care – PPO | Admitting: Cardiology

## 2019-03-06 VITALS — BP 130/86 | HR 61 | Ht 73.0 in | Wt 232.0 lb

## 2019-03-06 DIAGNOSIS — I252 Old myocardial infarction: Secondary | ICD-10-CM

## 2019-03-06 DIAGNOSIS — I519 Heart disease, unspecified: Secondary | ICD-10-CM

## 2019-03-06 DIAGNOSIS — I2510 Atherosclerotic heart disease of native coronary artery without angina pectoris: Secondary | ICD-10-CM

## 2019-03-06 DIAGNOSIS — R079 Chest pain, unspecified: Secondary | ICD-10-CM | POA: Diagnosis not present

## 2019-03-06 DIAGNOSIS — N5201 Erectile dysfunction due to arterial insufficiency: Secondary | ICD-10-CM

## 2019-03-06 NOTE — Progress Notes (Signed)
Cardiology Office Note   Date:  03/06/2019   ID:  Mark Levine, DOB 1955-06-05, MRN [id number]  PCP:  Pearson Grippe, MD  Cardiologist:  Dr. Katrinka Blazing     Chief Complaint  Patient presents with  . Chest Pain      History of Present Illness: Mark Levine is a 64 y.o. male who presents for chest pain.   Pt with a hx of DES to the LAD in 2015 with MI, HTN, HLD and ED. nuc in 2017 with no ischemia.  Lipids in Oct 2020 were good.  With LDL 74 and HLD 34  Last visit telehealth 11/01/18 with Dr. Katrinka Blazing He was doing well.  Achieving greater than 150 minutes of moderate activity per week.  No angina at that time.    Today he presents for chest pain, he has had 2 episodes lasted 5-10 min both at rest and no associated symptoms except for some dizziness.  With first he drifted off to sleep.   He has been doing well otherwise.  He walks a lot with his job 12-14.,000 steps per day without pain  This is not severe like with his MI.  He admits to more anxiety due to his wife's dx with lupus.  He has had teeth issues with root canal and on ABX.   He has not taken NTG not sure he has them, but he does use his sildenafil as needed.  He understands not to take within 48-72 hours of each other.   On last stress test in 2017 he decreased EF and echo ordered but not done.  Will do this.  He may need repeat exercise myoview.    Past Medical History:  Diagnosis Date  . CAD (coronary artery disease)    a. Inf STEMI5/15 >> LHC (04/02/13): ostial left main 20-25, apical LAD 70, mid to distal RCA occluded, EF 50% inferior HK. >> PCI: Promus premier (16 x 3 mm) DES to the RCA.   Marland Kitchen CAD (coronary artery disease), native coronary artery 05/10/2014   DES RCA during inferior STEMI   . Cough due to ACE inhibitor 05/11/2014   Lisinopril discontinued 05/11/14   . Erectile dysfunction 10/11/2013  . Essential hypertension 05/10/2014  . History of nuclear stress test    a.Myoview 1/17: EF 48%, apical and apical lateral scar, no  ischemia; Intermediate Risk (low EF)  . Hyperlipidemia   . 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 LEFT HEART CATHETERIZATION WITH CORONARY ANGIOGRAM N/A 04/02/2013   Procedure: LEFT HEART CATHETERIZATION WITH CORONARY ANGIOGRAM;  Surgeon: Lesleigh Noe, MD;  Location: [facility] CATH LAB;  Service: Cardiovascular;  Laterality: N/A;  . NO PAST SURGERIES       Current Outpatient Medications  Medication Sig Dispense Refill  . acetaminophen-codeine (TYLENOL #3) 300-30 MG per tablet Take 1 tablet by mouth every 4 (four) hours as needed for moderate pain.    Marland Kitchen amoxicillin (AMOXIL) 500 MG capsule Take 500 mg by mouth 3 (three) times daily.    Marland Kitchen aspirin EC 81 MG EC tablet Take 1 tablet (81 mg total) by mouth daily.    Marland Kitchen atorvastatin (LIPITOR) 40 MG tablet TAKE 1 TABLET BY MOUTH ONCE DAILY AT 6PM 30 tablet 11  . ipratropium (ATROVENT) 0.06 % nasal spray Place 1 spray into both nostrils daily as needed for rhinitis.     . methocarbamol (ROBAXIN) 500 MG tablet  Take 500 mg by mouth daily as needed for pain.    . metoprolol tartrate (LOPRESSOR) 25 MG tablet TAKE 1 TABLET BY MOUTH TWICE A DAY 180 tablet 3  . montelukast (SINGULAIR) 10 MG tablet Take 10 mg by mouth daily as needed (allergies).     . nitroGLYCERIN (NITROSTAT) 0.4 MG SL tablet Place 1 tablet (0.4 mg total) under the tongue every 5 (five) minutes x 3 doses as needed for chest pain. 25 tablet 2  . VIAGRA 100 MG tablet Take 100 mg by mouth daily as needed for erectile dysfunction.   1   No current facility-administered medications for this visit.    Allergies:   Other and Shellfish allergy    Social History:  The patient  reports that he has never smoked. He has never used smokeless tobacco. He reports that he does not drink alcohol or use drugs.   Family History:  The patient's family history includes Other in his father.    ROS:  General:no colds or fevers, no weight  changes Skin:no rashes or ulcers HEENT:no blurred vision, no congestion CV:see HPI PUL:see HPI GI:no diarrhea constipation or melena, no indigestion GU:no hematuria, no dysuria MS:no joint pain, no claudication Neuro:no syncope, no lightheadedness Endo:no diabetes, no thyroid disease  Wt Readings from Last 3 Encounters:  03/06/19 232 lb (105.2 kg)  11/01/18 224 lb (101.6 kg)  08/31/17 224 lb 12.8 oz (102 kg)     PHYSICAL EXAM: VS:  BP 130/86   Pulse 61   Ht 6\' 1"  (1.854 m)   Wt 232 lb (105.2 kg)   BMI 30.61 kg/m  , BMI Body mass index is 30.61 kg/m. General:Pleasant affect, NAD Skin:Warm and dry, brisk capillary refill HEENT:normocephalic, sclera clear, mucus membranes moist Neck:supple, no JVD, no bruits  Heart:S1S2 RRR without murmur, gallup, rub or click Lungs:clear without rales, rhonchi, or wheezes , non tender, + BS, do not palpate liver spleen or masses Ext:no lower ext edema, 2+ pedal pulses, 2+ radial pulses Neuro:alert and oriented X 3, MAE, follows commands, + facial symmetry    EKG:  EKG is ordered today. The ekg ordered today demonstrates SR at 61 stable  EKG    Recent Labs: No results found for requested labs within last 8760 hours.    Lipid Panel    Component Value Date/Time   CHOL 115 (L) 02/15/2015 0737   TRIG 65 02/15/2015 0737   HDL 40 02/15/2015 0737   CHOLHDL 2.9 02/15/2015 0737   VLDL 13 02/15/2015 0737   LDLCALC 62 02/15/2015 0737       Other studies Reviewed: Additional studies/ records that were reviewed today include:   nuc 02/15/15 . Study Highlights   Nuclear stress EF: 48%.  Horizontal ST segment depression ST segment depression of 4 mm was noted during stress in the II, III, aVF, V6, V5 and V4 leads, beginning at 3 minutes of stress, and returning to baseline after 1-5 minutes of recovery.  No T wave inversion was noted during stress.  Defect 1: There is a small defect of mild severity.  Findings consistent  with prior myocardial infarction.  This is a low risk study.   Small-size, mild intensity fixed apical/apical lateral perfusion defect with associated hypokinesis. This is most likely a small area of scar. LVEF 48%. This is an intermediate risk study, due to moderate LV dysfunction. There is no reversible ischemia.      ASSESSMENT AND PLAN:  1.  Chest pain two episodes at rest,  none with exertion.  Some dizziness but no other associated symptoms. Discussed ETT normal EKG but will discuss with Dr. Tamala Julian -pt is worried about cost.  It has been 15 years since MI  On BB and ASA 81 mg.   2.  CAD with stent  2015 to RCA with DES.  He did have residual disease LM 20-25% stenosis, LAD patent at apex eccentric 70% stenosis, LCX is small  Distal to the stented region there is an eccentric 30-40% narrowing. The proximal and mid RCA also contained irregularities with up to 30-40% narrowing. Hx of V fib with MI.   3.  LV dysfunction on last nuc 2017 48% and at cath 2015 EF 50%.  Will check echo.  This was ordered in 2017 but not done.  euvjolemic today   4.  ED stable, on sildenafil prn understands not to use sl NTG with this.    5.  HLD on statin and last labs reviewed and stable.   Pt plans for covid vaccine when he can have and practices 3 Ws.    Current medicines are reviewed with the patient today.  The patient Has no concerns regarding medicines.  The following changes have been made:  See above Labs/ tests ordered today include:see above  Disposition:   FU:  see above  Signed, Cecilie Kicks, NP  03/06/2019 [address], [place], [place] Phone: [phone]; Fax: [phone]

## 2019-03-06 NOTE — Progress Notes (Signed)
ETT

## 2019-03-06 NOTE — Patient Instructions (Signed)
Medication Instructions:  Your physician recommends that you continue on your current medications as directed. Please refer to the Current Medication list given to you today.  *If you need a refill on your cardiac medications before your next appointment, please call your pharmacy*  Lab Work: None ordered  If you have labs (blood work) drawn today and your tests are completely normal, you will receive your results only by: Marland Kitchen MyChart Message (if you have MyChart) OR . A paper copy in the mail If you have any lab test that is abnormal or we need to change your treatment, we will call you to review the results.  Testing/Procedures: Your physician has requested that you have an echocardiogram 03/16/2019 ARRIVE AT 8:00 FOR REGISTRATION. Echocardiography is a painless test that uses sound waves to create images of your heart. It provides your doctor with information about the size and shape of your heart and how well your heart's chambers and valves are working. This procedure takes approximately one hour. There are no restrictions for this procedure.    Follow-Up: At [facility], you and your health needs are our priority.  As part of our continuing mission to provide you with exceptional heart care, we have created designated Provider Care Teams.  These Care Teams include your primary Cardiologist (physician) and Advanced Practice Providers (APPs -  Physician Assistants and Nurse Practitioners) who all work together to provide you with the care you need, when you need it.  Your next appointment:   Keep your scheduled appointment 05/01/2019 with Dr. Katrinka Blazing as planned  Other Instructions  Echocardiogram An echocardiogram is a procedure that uses painless sound waves (ultrasound) to produce an image of the heart. Images from an echocardiogram can provide important information about:  Signs of coronary artery disease (CAD).  Aneurysm detection. An aneurysm is a weak or damaged part of an  artery wall that bulges out from the normal force of blood pumping through the body.  Heart size and shape. Changes in the size or shape of the heart can be associated with certain conditions, including heart failure, aneurysm, and CAD.  Heart muscle function.  Heart valve function.  Signs of a past heart attack.  Fluid buildup around the heart.  Thickening of the heart muscle.  A tumor or infectious growth around the heart valves. Tell a health care provider about:  Any allergies you have.  All medicines you are taking, including vitamins, herbs, eye drops, creams, and over-the-counter medicines.  Any blood disorders you have.  Any surgeries you have had.  Any medical conditions you have.  Whether you are pregnant or may be pregnant. What are the risks? Generally, this is a safe procedure. However, problems may occur, including:  Allergic reaction to dye (contrast) that may be used during the procedure. What happens before the procedure? No specific preparation is needed. You may eat and drink normally. What happens during the procedure?   An IV tube may be inserted into one of your veins.  You may receive contrast through this tube. A contrast is an injection that improves the quality of the pictures from your heart.  A gel will be applied to your chest.  A wand-like tool (transducer) will be moved over your chest. The gel will help to transmit the sound waves from the transducer.  The sound waves will harmlessly bounce off of your heart to allow the heart images to be captured in real-time motion. The images will be recorded on a computer. The  procedure may vary among health care providers and hospitals. What happens after the procedure?  You may return to your normal, everyday life, including diet, activities, and medicines, unless your health care provider tells you not to do that. Summary  An echocardiogram is a procedure that uses painless sound waves  (ultrasound) to produce an image of the heart.  Images from an echocardiogram can provide important information about the size and shape of your heart, heart muscle function, heart valve function, and fluid buildup around your heart.  You do not need to do anything to prepare before this procedure. You may eat and drink normally.  After the echocardiogram is completed, you may return to your normal, everyday life, unless your health care provider tells you not to do that. This information is not intended to replace advice given to you by your health care provider. Make sure you discuss any questions you have with your health care provider. Document Revised: 05/12/2018 Document Reviewed: 02/22/2016 Elsevier Patient Education  [place].

## 2019-03-08 ENCOUNTER — Telehealth: Payer: Self-pay | Admitting: *Deleted

## 2019-03-08 ENCOUNTER — Ambulatory Visit: Payer: BC Managed Care – PPO | Admitting: Nurse Practitioner

## 2019-03-08 DIAGNOSIS — R079 Chest pain, unspecified: Secondary | ICD-10-CM

## 2019-03-08 NOTE — Telephone Encounter (Signed)
Call placed to pt re: phone message below.  I have left a detailed message on pt's voicemail, as he was aware that we was going to speak with Dr. Katrinka Blazing and see what his plan was. Asked pt to call me back when he gets the message.

## 2019-03-08 NOTE — Telephone Encounter (Signed)
Pt returned the call re: stress test and he is ready to pursue and is aware someone from scheduling will contact him to schedule and arrange his covid19 teste.

## 2019-03-08 NOTE — Telephone Encounter (Signed)
-----   Message from Leone Brand, NP sent at 03/06/2019  4:55 PM EST ----- Please let pt know Dr. Katrinka Blazing wants a plain all treadmill.   Thanks, maybe test on Friday and test on Monday.   ----- Message ----- From: Lyn Records, MD Sent: 03/06/2019   4:46 PM EST To: Leone Brand, NP    ----- Message ----- From: Leone Brand, NP Sent: 03/06/2019   4:42 PM EST To: Lyn Records, MD  Saw Mark Levine, Mark Levine in 2015 with stent to RCA, last nuc 2017, atypical chest pain, lt pressure occurs at rest not exertion and was a little dizzy.  You had wanted echo with last stress test, so I ordered, but EKG was stable should I just do ETT or exercise myoview?  I think he was worried about cost.  He does have insurance.  thanks.

## 2019-03-16 ENCOUNTER — Ambulatory Visit (HOSPITAL_COMMUNITY): Payer: BC Managed Care – PPO | Attending: Cardiology

## 2019-03-16 ENCOUNTER — Other Ambulatory Visit: Payer: Self-pay

## 2019-03-16 DIAGNOSIS — I2510 Atherosclerotic heart disease of native coronary artery without angina pectoris: Secondary | ICD-10-CM | POA: Diagnosis not present

## 2019-03-16 DIAGNOSIS — I252 Old myocardial infarction: Secondary | ICD-10-CM | POA: Diagnosis not present

## 2019-03-16 DIAGNOSIS — I519 Heart disease, unspecified: Secondary | ICD-10-CM | POA: Diagnosis present

## 2019-03-16 MED ORDER — PERFLUTREN LIPID MICROSPHERE
1.0000 mL | INTRAVENOUS | Status: AC | PRN
  Administered 2019-03-16: 2 mL via INTRAVENOUS

## 2019-04-07 ENCOUNTER — Other Ambulatory Visit (HOSPITAL_COMMUNITY): Payer: BC Managed Care – PPO

## 2019-04-29 NOTE — Progress Notes (Signed)
Cardiology Office Note:    Date:  05/01/2019   ID:  Mark Levine, DOB Jun 10, 1955, MRN [id number]  PCP:  [name], MD  Cardiologist:  Sinclair Grooms, MD   Referring MD: [name], MD   Chief Complaint  Patient presents with  . Coronary Artery Disease  . Hypertension    History of Present Illness:    Mark Levine is a 64 y.o. male with a hx of DES to the LAD in 2015, HTN, HLD and ED.   Mr. Margaretmary Bayley has been having some focal left parasternal discomfort that is noticeable at rest.  He goes away with physical activity.  He has had no associated shortness of breath, orthopnea, PND, or point tenderness.  Exertional tolerance is unchanged.  He denies orthopnea, PND, palpitations.  No medication side effects.  Past Medical History:  Diagnosis Date  . CAD (coronary artery disease)    a. Inf STEMI5/15 >> LHC (04/02/13): ostial left main 20-25, apical LAD 70, mid to distal RCA occluded, EF 50% inferior HK. >> PCI: Promus premier (16 x 3 mm) DES to the RCA.   Mark Levine CAD (coronary artery disease), native coronary artery 05/10/2014   DES RCA during inferior STEMI   . Cough due to ACE inhibitor 05/11/2014   Lisinopril discontinued 05/11/14   . Erectile dysfunction 10/11/2013  . Essential hypertension 05/10/2014  . History of nuclear stress test    a.Myoview 1/17: EF 48%, apical and apical lateral scar, no ischemia; Intermediate Risk (low EF)  . Hyperlipidemia   . 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 LEFT HEART CATHETERIZATION WITH CORONARY ANGIOGRAM N/A 04/02/2013   Procedure: LEFT HEART CATHETERIZATION WITH CORONARY ANGIOGRAM;  Surgeon: Sinclair Grooms, MD;  Location: [facility] CATH LAB;  Service: Cardiovascular;  Laterality: N/A;  . NO PAST SURGERIES      Current Medications: Current Meds  Medication Sig  . acetaminophen-codeine (TYLENOL #3) 300-30 MG per tablet Take 1 tablet by mouth every 4 (four) hours as needed for moderate pain.    Mark Levine aspirin EC 81 MG EC tablet Take 1 tablet (81 mg total) by mouth daily.  Mark Levine atorvastatin (LIPITOR) 40 MG tablet Take 20 mg by mouth daily.  . metoprolol tartrate (LOPRESSOR) 25 MG tablet TAKE 1 TABLET BY MOUTH TWICE A DAY  . nitroGLYCERIN (NITROSTAT) 0.4 MG SL tablet Place 1 tablet (0.4 mg total) under the tongue every 5 (five) minutes x 3 doses as needed for chest pain.  Mark Levine VIAGRA 100 MG tablet Take 100 mg by mouth daily as needed for erectile dysfunction.      Allergies:   Other and Shellfish allergy   Social History   Socioeconomic History  . Marital status: Married    Spouse name: Not on file  . Number of children: Not on file  . Years of education: Not on file  . Highest education level: Not on file  Occupational History  . Not on file  Tobacco Use  . Smoking status: Never Smoker  . Smokeless tobacco: Never Used  Substance and Sexual Activity  . Alcohol use: No    Alcohol/week: 0.0 standard drinks  . Drug use: No  . Sexual activity: Not on file  Other Topics Concern  . Not on file  Social History Narrative  . Not on file   Social Determinants of Health   Financial Resource Strain:   . Difficulty  of Paying Living Expenses:   Food Insecurity:   . Worried About Programme researcher, broadcasting/film/video in the Last Year:   . Barista in the Last Year:   Transportation Needs:   . Freight forwarder (Medical):   Mark Levine Lack of Transportation (Non-Medical):   Physical Activity:   . Days of Exercise per Week:   . Minutes of Exercise per Session:   Stress:   . Feeling of Stress :   Social Connections:   . Frequency of Communication with Friends and Family:   . Frequency of Social Gatherings with Friends and Family:   . Attends Religious Services:   . Active Member of Clubs or Organizations:   . Attends Banker Meetings:   Mark Levine Marital Status:      Family History: The patient's family history includes Other in his father.  ROS:   Please see the history of present  illness.    He is golf frequently.  Has 2 granddaughters and 1 grandson.  He enjoys physical activity.  No difficulty with playing golf.  He and his wife walk when they play.  All other systems reviewed and are negative.  EKGs/Labs/Other Studies Reviewed:    The following studies were reviewed today: No recent laboratory data in our system but he says that Dr. Selena Batten has done blood work within the past 6 months. Echocardiogram done February 2021: IMPRESSIONS    1. Definity used; hypokinesis of the basal inferolateral wall with  overall preserved LV systolic function; grade 2 diastolic dysfunction;  mild LVH; mild MR.  2. Left ventricular ejection fraction, by estimation, is 55 to 60%. The  left ventricle has normal function. The left ventrical demonstrates  regional wall motion abnormalities (see scoring diagram/findings for  description). There is mildly increased left  ventricular hypertrophy. Left ventricular diastolic parameters are  consistent with Grade II diastolic dysfunction (pseudonormalization).  3. Right ventricular systolic function is normal. The right ventricular  size is normal.  4. The mitral valve is normal in structure and function. Mild mitral  valve regurgitation. No evidence of mitral stenosis.  5. The aortic valve is tricuspid. Aortic valve regurgitation is not  visualized. No aortic stenosis is present.  6. The inferior vena cava is normal in size with greater than 50%  respiratory variability, suggesting right atrial pressure of 3 mmHg.  EKG:  EKG not repeated  Recent Labs: No results found for requested labs within last 8760 hours.  Recent Lipid Panel    Component Value Date/Time   CHOL 115 (L) 02/15/2015 0737   TRIG 65 02/15/2015 0737   HDL 40 02/15/2015 0737   CHOLHDL 2.9 02/15/2015 0737   VLDL 13 02/15/2015 0737   LDLCALC 62 02/15/2015 0737    Physical Exam:    VS:  BP 124/84   Pulse 64   Ht 6\' 1"  (1.854 m)   Wt 226 lb 9.6 oz (102.8 kg)    SpO2 98%   BMI 29.90 kg/m     Wt Readings from Last 3 Encounters:  05/01/19 226 lb 9.6 oz (102.8 kg)  03/06/19 232 lb (105.2 kg)  11/01/18 224 lb (101.6 kg)     GEN: Healthy-appearing. No acute distress HEENT: Normal NECK: No JVD. LYMPHATICS: No lymphadenopathy CARDIAC:  RRR without murmur, gallop, or edema. VASCULAR:  Normal Pulses. No bruits. RESPIRATORY:  Clear to auscultation without rales, wheezing or rhonchi  ABDOMEN: Soft, non-tender, non-distended, No pulsatile mass, MUSCULOSKELETAL: No deformity  SKIN: Warm and dry  NEUROLOGIC:  Alert and oriented x 3 PSYCHIATRIC:  Normal affect   ASSESSMENT:    1. Coronary artery disease involving native coronary artery of native heart without angina pectoris   2. Essential hypertension   3. Mixed hyperlipidemia   4. Educated about COVID-19 virus infection    PLAN:    In order of problems listed above:  1. Secondary prevention discussed.  Chest discomfort is vague and noticeable only at rest.  No limitations.  Continue observation. 2. Blood pressure is excellently controlled. 3. LDL target less than 70.  LDL within our system was 7106 when LDL was 65.  He is sure that the laboratory data has been checked by Dr. Selena Batten but just cannot remember the number. 4. He has gotten the vaccine.  He is socially distancing.  Clinical observation.  66-month follow-up.  Overall education and awareness concerning primary/secondary risk prevention was discussed in detail: LDL less than 70, hemoglobin A1c less than 7, blood pressure target less than 130/80 mmHg, >150 minutes of moderate aerobic activity per week, avoidance of smoking, weight control (via diet and exercise), and continued surveillance/management of/for obstructive sleep apnea.    Medication Adjustments/Labs and Tests Ordered: Current medicines are reviewed at length with the patient today.  Concerns regarding medicines are outlined above.  No orders of the defined types were  placed in this encounter.  No orders of the defined types were placed in this encounter.   Patient Instructions  Medication Instructions:  Your physician recommends that you continue on your current medications as directed. Please refer to the Current Medication list given to you today.  *If you need a refill on your cardiac medications before your next appointment, please call your pharmacy*   Lab Work: None If you have labs (blood work) drawn today and your tests are completely normal, you will receive your results only by: Mark Levine MyChart Message (if you have MyChart) OR . A paper copy in the mail If you have any lab test that is abnormal or we need to change your treatment, we will call you to review the results.   Testing/Procedures: None   Follow-Up: At [facility], you and your health needs are our priority.  As part of our continuing mission to provide you with exceptional heart care, we have created designated Provider Care Teams.  These Care Teams include your primary Cardiologist (physician) and Advanced Practice Providers (APPs -  Physician Assistants and Nurse Practitioners) who all work together to provide you with the care you need, when you need it.  We recommend signing up for the patient portal called "MyChart".  Sign up information is provided on this After Visit Summary.  MyChart is used to connect with patients for Virtual Visits (Telemedicine).  Patients are able to view lab/test results, encounter notes, upcoming appointments, etc.  Non-urgent messages can be sent to your provider as well.   To learn more about what you can do with MyChart, go to [email or web address].    Your next appointment:   6 month(s)  The format for your next appointment:   In Person  Provider:   You may see Lesleigh Noe, MD or one of the following Advanced Practice Providers on your designated Care Team:    Norma Fredrickson, NP  Nada Boozer, NP  Georgie Chard,  NP    Other Instructions      Signed, Lesleigh Noe, MD  05/01/2019 4:27 PM    [facility]

## 2019-05-01 ENCOUNTER — Ambulatory Visit: Payer: BC Managed Care – PPO | Admitting: Interventional Cardiology

## 2019-05-01 ENCOUNTER — Encounter: Payer: Self-pay | Admitting: Interventional Cardiology

## 2019-05-01 ENCOUNTER — Other Ambulatory Visit: Payer: Self-pay

## 2019-05-01 VITALS — BP 124/84 | HR 64 | Ht 73.0 in | Wt 226.6 lb

## 2019-05-01 DIAGNOSIS — E782 Mixed hyperlipidemia: Secondary | ICD-10-CM | POA: Diagnosis not present

## 2019-05-01 DIAGNOSIS — Z7189 Other specified counseling: Secondary | ICD-10-CM | POA: Diagnosis not present

## 2019-05-01 DIAGNOSIS — I10 Essential (primary) hypertension: Secondary | ICD-10-CM

## 2019-05-01 DIAGNOSIS — I2510 Atherosclerotic heart disease of native coronary artery without angina pectoris: Secondary | ICD-10-CM | POA: Diagnosis not present

## 2019-05-01 NOTE — Patient Instructions (Signed)

## 2019-05-09 ENCOUNTER — Telehealth: Payer: Self-pay | Admitting: Interventional Cardiology

## 2019-05-09 NOTE — Telephone Encounter (Signed)
On 05/04/19 patient spoke with Scheduler on the phone and stated he didn't need the  GXT that was schedule.   Order will be closed.

## 2019-12-12 ENCOUNTER — Other Ambulatory Visit (HOSPITAL_COMMUNITY): Payer: Self-pay | Admitting: Internal Medicine

## 2019-12-12 ENCOUNTER — Ambulatory Visit (HOSPITAL_COMMUNITY)
Admission: RE | Admit: 2019-12-12 | Discharge: 2019-12-12 | Disposition: A | Discharge status: Home / Self Care | Payer: BC Managed Care – PPO | Source: Ambulatory Visit | Attending: Internal Medicine | Admitting: Internal Medicine

## 2019-12-12 ENCOUNTER — Other Ambulatory Visit: Payer: Self-pay

## 2019-12-12 DIAGNOSIS — M7989 Other specified soft tissue disorders: Secondary | ICD-10-CM | POA: Diagnosis not present

## 2019-12-12 DIAGNOSIS — M79605 Pain in left leg: Secondary | ICD-10-CM

## 2019-12-12 NOTE — Progress Notes (Signed)
Lower extremity venous has been completed.   Preliminary results in CV Proc.   Blanch Media 12/12/2019 2:32 PM

## 2020-01-14 ENCOUNTER — Other Ambulatory Visit: Payer: Self-pay | Admitting: Nurse Practitioner

## 2020-02-10 ENCOUNTER — Other Ambulatory Visit: Payer: Self-pay | Admitting: Interventional Cardiology

## 2020-02-28 ENCOUNTER — Telehealth: Payer: Self-pay | Admitting: Interventional Cardiology

## 2020-02-28 NOTE — Telephone Encounter (Signed)
STAT if patient feels like he/she is going to faint   1) Are you dizzy now? No   2) Do you feel faint or have you passed out? No   3) Do you have any other symptoms? No   4) Have you checked your HR and BP (record if available)? No

## 2020-02-28 NOTE — Telephone Encounter (Signed)
Patient returning Mark Levine's call, connected call to Mark Levine.  

## 2020-02-28 NOTE — Telephone Encounter (Signed)
Left message to call back  

## 2020-02-28 NOTE — Telephone Encounter (Signed)
Pt with episodes of dizzines ("feeling swimmy headed).  Lays down and goes to sleep and dizziness is gone when he wakes up. Had an episode last night but hadn't had one for a week and half prior to that. Seen dentist on Monday and dentist noted sinus congestion.  Pt states this has been going on for awhile.  Dentist sent in an antibiotic for pt.  Denies CP, SOB or other cardiac issues. Advised pt this doesn't sound cardiac related and to continue antibiotic and if no improvement, contact PCP.  Pt verbalized understanding and was in agreement with plan. I also advised to monitor BP when episodes are occurring and to contact office if BP consistently too low.

## 2020-04-03 ENCOUNTER — Other Ambulatory Visit: Payer: Self-pay

## 2020-04-03 ENCOUNTER — Encounter: Payer: Self-pay | Admitting: Physician Assistant

## 2020-04-03 ENCOUNTER — Ambulatory Visit: Payer: BC Managed Care – PPO | Admitting: Physician Assistant

## 2020-04-03 ENCOUNTER — Ambulatory Visit: Payer: Self-pay | Admitting: Physician Assistant

## 2020-04-03 VITALS — BP 124/60 | HR 57 | Ht 73.0 in | Wt 224.8 lb

## 2020-04-03 DIAGNOSIS — E782 Mixed hyperlipidemia: Secondary | ICD-10-CM

## 2020-04-03 NOTE — Progress Notes (Signed)
Cardiology Office Note:    Date:  04/03/2020   ID:  Mark Levine, DOB 09-Sep-1955, MRN [id number]  PCP:  Pearson Grippe, MD  [facility] Cardiologist:  Lesleigh Noe, MD  [facility] Electrophysiologist:  None   Chief Complaint: yearly follow up   History of Present Illness:    Mark Levine is a 65 y.o. male with a hx of CAD s/p DES to RCA in 2015, HTN, HLD and ED seen for long due follow up.Marland Kitchen   He was admitted 04/2013 with an inferior STEMI. Course was complicated by ventricular fibrillation x2 treated with defibrillation each time. Emergent LHC demonstrated an occluded RCA which was treated with a Promus DES. He had residual disease in the apical LAD that was treated medically.   Low risk stress test 02/2015. Echo 03/2019 with LVEF of 55-60% and grade II DD. Hypokinesis of the basal inferolateral wall. Mild LVHH and mild MR.   Patient is here for follow-up.  He was dealing with dizziness in January which was resolved after antibiotic treatment for sinus infection and removal of teeth.  No recurrence since then.  He is very active.  He works at [employer].  On his feet all the time.  Does lifting and moving without chest pain or shortness of breath.  He denies chest pain, shortness of breath, orthopnea, PND, syncope, lower extremity edema or melena.  No palpitation.  Past Medical History:  Diagnosis Date  . CAD (coronary artery disease)    a. Inf STEMI5/15 >> LHC (04/02/13): ostial left main 20-25, apical LAD 70, mid to distal RCA occluded, EF 50% inferior HK. >> PCI: Promus premier (16 x 3 mm) DES to the RCA.   Marland Kitchen CAD (coronary artery disease), native coronary artery 05/10/2014   DES RCA during inferior STEMI   . Cough due to ACE inhibitor 05/11/2014   Lisinopril discontinued 05/11/14   . Erectile dysfunction 10/11/2013  . Essential hypertension 05/10/2014  . History of nuclear stress test    a.Myoview 1/17: EF 48%, apical and apical lateral scar, no ischemia; Intermediate Risk (low EF)  .  Hyperlipidemia   . 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 LEFT HEART CATHETERIZATION WITH CORONARY ANGIOGRAM N/A 04/02/2013   Procedure: LEFT HEART CATHETERIZATION WITH CORONARY ANGIOGRAM;  Surgeon: Lesleigh Noe, MD;  Location: [facility] CATH LAB;  Service: Cardiovascular;  Laterality: N/A;  . NO PAST SURGERIES      Current Medications: Current Meds  Medication Sig  . acetaminophen-codeine (TYLENOL #3) 300-30 MG per tablet Take 1 tablet by mouth every 4 (four) hours as needed for moderate pain.  Marland Kitchen aspirin EC 81 MG EC tablet Take 1 tablet (81 mg total) by mouth daily.  Marland Kitchen atorvastatin (LIPITOR) 40 MG tablet Take 0.5 tablets (20 mg total) by mouth daily at 6 PM. Please make yearly appt with Dr. Katrinka Blazing for March 2022 for future refills. Thank you 1st attempt  . metoprolol tartrate (LOPRESSOR) 25 MG tablet TAKE 1 TABLET BY MOUTH TWICE A DAY  . nitroGLYCERIN (NITROSTAT) 0.4 MG SL tablet Place 1 tablet (0.4 mg total) under the tongue every 5 (five) minutes x 3 doses as needed for chest pain.  Marland Kitchen VIAGRA 100 MG tablet Take 100 mg by mouth daily as needed for erectile dysfunction.      Allergies:   Other and Shellfish allergy   Social History   Socioeconomic History  .  Marital status: Married    Spouse name: Not on file  . Number of children: Not on file  . Years of education: Not on file  . Highest education level: Not on file  Occupational History  . Not on file  Tobacco Use  . Smoking status: Never Smoker  . Smokeless tobacco: Never Used  Vaping Use  . Vaping Use: Never used  Substance and Sexual Activity  . Alcohol use: No    Alcohol/week: 0.0 standard drinks  . Drug use: No  . Sexual activity: Not on file  Other Topics Concern  . Not on file  Social History Narrative  . Not on file   Social Determinants of Health   Financial Resource Strain: Not on file  Food Insecurity: Not on file  Transportation Needs: Not on  file  Physical Activity: Not on file  Stress: Not on file  Social Connections: Not on file     Family History: The patient's family history includes Other in his father.   ROS:   Please see the history of present illness.    All other systems reviewed and are negative.   EKGs/Labs/Other Studies Reviewed:    The following studies were reviewed today:  Echo 03/2019 1. Definity used; hypokinesis of the basal inferolateral wall with  overall preserved LV systolic function; grade 2 diastolic dysfunction;  mild LVH; mild MR.  2. Left ventricular ejection fraction, by estimation, is 55 to 60%. The  left ventricle has normal function. The left ventrical demonstrates  regional wall motion abnormalities (see scoring diagram/findings for  description). There is mildly increased left  ventricular hypertrophy. Left ventricular diastolic parameters are  consistent with Grade II diastolic dysfunction (pseudonormalization).  3. Right ventricular systolic function is normal. The right ventricular  size is normal.  4. The mitral valve is normal in structure and function. Mild mitral  valve regurgitation. No evidence of mitral stenosis.  5. The aortic valve is tricuspid. Aortic valve regurgitation is not  visualized. No aortic stenosis is present.  6. The inferior vena cava is normal in size with greater than 50%  respiratory variability, suggesting right atrial pressure of 3 mmHg.   Stress test 02/2015  Nuclear stress EF: 48%.  Horizontal ST segment depression ST segment depression of 4 mm was noted during stress in the II, III, aVF, V6, V5 and V4 leads, beginning at 3 minutes of stress, and returning to baseline after 1-5 minutes of recovery.  No T wave inversion was noted during stress.  Defect 1: There is a small defect of mild severity.  Findings consistent with prior myocardial infarction.  This is a low risk study.   Small-size, mild intensity fixed apical/apical lateral  perfusion defect with associated hypokinesis. This is most likely a small area of scar. LVEF 48%. This is an intermediate risk study, due to moderate LV dysfunction. There is no reversible ischemia.  Cath 04/2013  INDICATIONS: Acute Inferior STEMI  PROCEDURE: 1. Left heart catheterization; 2. Coronary angiography; 3. Left ventriculography; 4. Drug-eluting stent implantation, RCA  CONSENT:  The risks, benefits, and details of the procedure were explained in detail to the patient. Risks including death, stroke, heart attack, kidney injury, allergy, limb ischemia, bleeding and radiation injury were discussed.  The patient verbalized understanding and wanted to proceed.  Informed written consent was obtained.  PROCEDURE TECHNIQUE:  After Xylocaine anesthesia a 5 French Slender sheath was placed in the right radial artery with a single anterior needle wall stick.  Coronary angiography  was done using  5 [place] JR 4 and JL 3.5 diagnostic catheters.  Left ventriculography was done using the JR 4 catheter and hand injection.    The RCA was identified as the culprit. A bolus of bivalirudin was followed by an infusion with a resultant ACT > 300 sec. 180 mg of Brilinta was administered orally. A JR 4 guide catheter and a Pro-water guidewire were used to obtain access in the right coronary and cross the distal RCA total occlusion. Angioplasty with a 2.5 x 12 mm balloon was performed followed by positioning and deployment of a 16 x 3.0 mm          Promus Premier DES. Postdilatation was performed with a 3.25 x 12 mm Ester balloon to 14 atmospheres. TIMI grade 3 flow was the result. No evidence of distal emboli was noted.  Post procedure, the bivalirudin infusion was discontinued. A single bolus of Aggrastat was administered.  Hemostasis was achieved with a wristband.  MEDICATIONS: Versed 1 mg; 50 mcg of fentanyl    CONTRAST:  Total of 150 cc.  COMPLICATIONS:  Ventricular fibrillation x 2 in the  emergency room requiring defibrillation pre-cath.    HEMODYNAMICS:  Aortic pressure 125/74 mmHg; LV pressure 129/16; LVEDP 23 mmHg  ANGIOGRAPHIC DATA:   The left main coronary artery is widely patent with perhaps 20-25% ostial narrowing..  The left anterior descending artery is widely patent and wraps around the left ventricular apex. Irregularities are noted throughout the mid vessel. At the apex there is an eccentric 70% stenosis..  The left circumflex artery is small and gives origin to one obtuse marginal branch.  The ramus intermedius is large and bifurcates into 2 moderate size vessels. Luminal irregularities are noted..  The right coronary artery is totally occluded in the mid to distal segment. No collaterals are noted.   PCI RESULTS: Total occlusion in the mid to distal RCA was reduced to 0% with TIMI grade 3 flow. The area of total occlusion was focal. Distal to the stented region there is an eccentric 30-40% narrowing. The proximal and mid RCA also contained irregularities with up to 30-40% narrowing.  LEFT VENTRICULOGRAM:  Left ventricular angiogram was done in the 30 RAO projection and revealed moderate inferobasal and mid inferior wall hypokinesis. Ejection fraction estimated at 50%   IMPRESSIONS:  1. Acute inferior wall ST elevation myocardial infarction complicated by ventricular fibrillation x2 in the emergency department requiring defibrillation on each instance.  2. Total occlusion of the mid/distal RCA was reduced to 0% with TIMI grade 3 flow after drug-eluting stent implantation and post dilatation of 3.25 mm .  3. The apical LAD contains an eccentric 50-70% stenosis. Otherwise there are luminal irregularities in both the LAD and circumflex/ramus proximal segments .  4. Moderate inferobasal and mid air for wall hypokinesis with preserved LVEF of 50%   RECOMMENDATION:  1. Fast track with discharge anticipated 72 hours after reperfusion assuming no  complications.  2. Aspirin,Brilinta, statin therapy, and eventual beta blocker therapy. Beta blocker has not yet been started because of significant bradycardia and amiodarone therapy that was given as a bolus dose after the patient's second ventricular fibrillation episode. We will be able to safely start beta blocker therapy in the a.m.  3. Would anticipate ambulation and Phase 1 cardiac rehabilitation starting in the a.m.  EKG:  EKG is not ordered today.   Recent Labs: No results found for requested labs within last 8760 hours.  Recent Lipid Panel  Component Value Date/Time   CHOL 115 (L) 02/15/2015 0737   TRIG 65 02/15/2015 0737   HDL 40 02/15/2015 0737   CHOLHDL 2.9 02/15/2015 0737   VLDL 13 02/15/2015 0737   LDLCALC 62 02/15/2015 0737    Physical Exam:    VS:  BP 124/60   Pulse (!) 57   Ht 6\' 1"  (1.854 m)   Wt 224 lb 12.8 oz (102 kg)   SpO2 97%   BMI 29.66 kg/m     Wt Readings from Last 3 Encounters:  04/03/20 224 lb 12.8 oz (102 kg)  05/01/19 226 lb 9.6 oz (102.8 kg)  03/06/19 232 lb (105.2 kg)     GEN:  Well nourished, well developed in no acute distress HEENT: Normal NECK: No JVD; No carotid bruits LYMPHATICS: No lymphadenopathy CARDIAC:RRR, no murmurs, rubs, gallops RESPIRATORY:  Clear to auscultation without rales, wheezing or rhonchi  ABDOMEN: Soft, non-tender, non-distended MUSCULOSKELETAL:  No edema; No deformity  SKIN: Warm and dry NEUROLOGIC:  Alert and oriented x 3 PSYCHIATRIC:  Normal affect   ASSESSMENT AND PLAN:    1. CAD No angina.  Continue aspirin, statin and beta-blocker.  2.  Hyperlipidemia -Followed by PCP -Continue Lipitor 40 mg daily  3.  Hypertension -Blood pressure stable on current medication  Medication Adjustments/Labs and Tests Ordered: Current medicines are reviewed at length with the patient today.  Concerns regarding medicines are outlined above.  No orders of the defined types were placed in this encounter.  No  orders of the defined types were placed in this encounter.   Patient Instructions  Medication Instructions:  Your physician recommends that you continue on your current medications as directed. Please refer to the Current Medication list given to you today.  *If you need a refill on your cardiac medications before your next appointment, please call your pharmacy*   Lab Work: None ordered  If you have labs (blood work) drawn today and your tests are completely normal, you will receive your results only by: 05/04/19 MyChart Message (if you have MyChart) OR . A paper copy in the mail If you have any lab test that is abnormal or we need to change your treatment, we will call you to review the results.   Testing/Procedures: None ordered   Follow-Up: At [facility], you and your health needs are our priority.  As part of our continuing mission to provide you with exceptional heart care, we have created designated Provider Care Teams.  These Care Teams include your primary Cardiologist (physician) and Advanced Practice Providers (APPs -  Physician Assistants and Nurse Practitioners) who all work together to provide you with the care you need, when you need it.  We recommend signing up for the patient portal called "MyChart".  Sign up information is provided on this After Visit Summary.  MyChart is used to connect with patients for Virtual Visits (Telemedicine).  Patients are able to view lab/test results, encounter notes, upcoming appointments, etc.  Non-urgent messages can be sent to your provider as well.   To learn more about what you can do with MyChart, go to [facility].    Your next appointment:   1 Year  The format for your next appointment:   In Person  Provider:   [email or web address], MD   Other Instructions      Signed, Verdis Prime, PA  04/03/2020 4:09 PM    [facility]

## 2020-04-03 NOTE — Patient Instructions (Signed)
Medication Instructions:  Your physician recommends that you continue on your current medications as directed. Please refer to the Current Medication list given to you today.  *If you need a refill on your cardiac medications before your next appointment, please call your pharmacy*   Lab Work: None ordered  If you have labs (blood work) drawn today and your tests are completely normal, you will receive your results only by: Marland Kitchen MyChart Message (if you have MyChart) OR . A paper copy in the mail If you have any lab test that is abnormal or we need to change your treatment, we will call you to review the results.   Testing/Procedures: None ordered   Follow-Up: At [facility], you and your health needs are our priority.  As part of our continuing mission to provide you with exceptional heart care, we have created designated Provider Care Teams.  These Care Teams include your primary Cardiologist (physician) and Advanced Practice Providers (APPs -  Physician Assistants and Nurse Practitioners) who all work together to provide you with the care you need, when you need it.  We recommend signing up for the patient portal called "MyChart".  Sign up information is provided on this After Visit Summary.  MyChart is used to connect with patients for Virtual Visits (Telemedicine).  Patients are able to view lab/test results, encounter notes, upcoming appointments, etc.  Non-urgent messages can be sent to your provider as well.   To learn more about what you can do with MyChart, go to [email or web address].    Your next appointment:   1 Year  The format for your next appointment:   In Person  Provider:   Verdis Prime, MD   Other Instructions

## 2020-05-06 ENCOUNTER — Other Ambulatory Visit: Payer: Self-pay | Admitting: Interventional Cardiology

## 2020-05-27 ENCOUNTER — Ambulatory Visit: Payer: Self-pay | Admitting: Interventional Cardiology

## 2020-09-03 ENCOUNTER — Other Ambulatory Visit: Payer: Self-pay | Admitting: Interventional Cardiology

## 2021-03-30 NOTE — Progress Notes (Signed)
Cardiology Office Note:    Date:  03/31/2021   ID:  Mark Levine, DOB December 23, 1955, MRN [id number]  PCP:  [name], MD  Cardiologist:  Sinclair Grooms, MD   Referring MD: [name], MD   Chief Complaint  Patient presents with   Coronary Artery Disease   Hyperlipidemia   Hypertension    History of Present Illness:    Mark Levine is a 66 y.o. male with a hx of  DES to the LAD in 2015, HTN, HLD and ED.    Occasional early morning headache.  Otherwise no complaints.  Not short of breath.  No chest pain or edema.  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  Uses Tylenol but no nonsteroidals.  Past Medical History:  Diagnosis Date   CAD (coronary artery disease)    a. Inf STEMI5/15 >> LHC (04/02/13): ostial left main 20-25, apical LAD 70, mid to distal RCA occluded, EF 50% inferior HK. >> PCI:  Promus premier (16 x 3 mm) DES to the RCA.    CAD (coronary artery disease), native coronary artery 05/10/2014   DES RCA during inferior STEMI    Cough due to ACE inhibitor 05/11/2014   Lisinopril discontinued 05/11/14    Erectile dysfunction 10/11/2013   Essential hypertension 05/10/2014   History of nuclear stress test    a.Myoview 1/17: EF 48%, apical and apical lateral scar, no ischemia; Intermediate Risk (low EF)   Hyperlipidemia    MI, 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   Inferior STEMI  04/02/13     Past Surgical History:  Procedure Laterality Date   LEFT HEART CATHETERIZATION WITH CORONARY ANGIOGRAM N/A 04/02/2013   Procedure: LEFT HEART CATHETERIZATION WITH CORONARY ANGIOGRAM;  Surgeon: Sinclair Grooms, MD;  Location[facility] CATH LAB;  Service: Cardiovascular;  Laterality: N/A;   NO PAST SURGERIES      Current Medications: Current Meds  Medication Sig   acetaminophen-codeine (TYLENOL #3) 300-30 MG per tablet Take 1 tablet by mouth every 4 (four) hours as needed for moderate pain.   aspirin EC 81 MG EC tablet Take 1 tablet (81 mg total) by mouth daily.   atorvastatin (LIPITOR) 40 MG  tablet TAKE A HALF TABLET DAILY AT 6 PM. PLEASE MAKE YEARLY APPT W/DR. Tanaja Ganger 04/2020 FOR FUTURE REFILLS   metoprolol tartrate (LOPRESSOR) 25 MG tablet TAKE 1 TABLET BY MOUTH TWICE A DAY   nitroGLYCERIN (NITROSTAT) 0.4 MG SL tablet Place 1 tablet (0.4 mg total) under the tongue every 5 (five) minutes x 3 doses as needed for chest pain.   traZODone (DESYREL) 50 MG tablet Take 1 tablet by mouth as needed.   VIAGRA 100 MG tablet Take 100 mg by mouth daily as needed for erectile dysfunction.      Allergies:   Other and Shellfish allergy   Social History   Socioeconomic History   Marital status: Married    Spouse name: Not on file   Number of children: Not on file   Years of education: Not on file   Highest education level: Not on file  Occupational History   Not on file  Tobacco Use   Smoking status: Never   Smokeless tobacco: Never  Vaping Use   Vaping Use: Never used  Substance and Sexual Activity   Alcohol use: No    Alcohol/week: 0.0 standard drinks   Drug use: No   Sexual activity: Not on file  Other Topics Concern   Not on file  Social  History Narrative   Not on file   Social Determinants of Health   Financial Resource Strain: Not on file  Food Insecurity: Not on file  Transportation Needs: Not on file  Physical Activity: Not on file  Stress: Not on file  Social Connections: Not on file     Family History: The patient's family history includes Other in his father.  ROS:   Please see the history of present illness.    Right shoulder discomfort and right hand discomfort related to arthritis.  All other systems reviewed and are negative.  EKGs/Labs/Other Studies Reviewed:    The following studies were reviewed today:  2 D Doppler ECHOCARDIOGRAM 03/2019: IMPRESSIONS     1. Definity used; hypokinesis of the basal inferolateral wall with  overall preserved LV systolic function; grade 2 diastolic dysfunction;  mild LVH; mild MR.   2. Left ventricular ejection  fraction, by estimation, is 55 to 60%. The  left ventricle has normal function. The left ventrical demonstrates  regional wall motion abnormalities (see scoring diagram/findings for  description). There is mildly increased left   ventricular hypertrophy. Left ventricular diastolic parameters are  consistent with Grade II diastolic dysfunction (pseudonormalization).   3. Right ventricular systolic function is normal. The right ventricular  size is normal.   4. The mitral valve is normal in structure and function. Mild mitral  valve regurgitation. No evidence of mitral stenosis.   5. The aortic valve is tricuspid. Aortic valve regurgitation is not  visualized. No aortic stenosis is present.   6. The inferior vena cava is normal in size with greater than 50%  respiratory variability, suggesting right atrial pressure of 3 mmHg.   EKG:  EKG sinus bradycardia, increased QRS voltage.  Normal PR interval.  Recent Labs: No results found for requested labs within last 8760 hours.  Recent Lipid Panel    Component Value Date/Time   CHOL 115 (L) 02/15/2015 0737   TRIG 65 02/15/2015 0737   HDL 40 02/15/2015 0737   CHOLHDL 2.9 02/15/2015 0737   VLDL 13 02/15/2015 0737   LDLCALC 62 02/15/2015 0737    Physical Exam:    VS:  BP (!) 152/88    Pulse (!) 57    Ht 6\' 1"  (1.854 m)    Wt 229 lb (103.9 kg)    SpO2 99%    BMI 30.21 kg/m     Wt Readings from Last 3 Encounters:  03/31/21 229 lb (103.9 kg)  04/03/20 224 lb 12.8 oz (102 kg)  05/01/19 226 lb 9.6 oz (102.8 kg)     GEN: Healthy appearing, overweight.. No acute distress HEENT: Normal NECK: No JVD. LYMPHATICS: No lymphadenopathy CARDIAC: No murmur. RRR S4 gallop but no S3 or edema. VASCULAR:  Normal Pulses. No bruits. RESPIRATORY:  Clear to auscultation without rales, wheezing or rhonchi  ABDOMEN: Soft, non-tender, non-distended, No pulsatile mass, MUSCULOSKELETAL: No deformity  SKIN: Warm and dry NEUROLOGIC:  Alert and oriented x  3 PSYCHIATRIC:  Normal affect   ASSESSMENT:    1. Coronary artery disease involving native coronary artery of native heart without angina pectoris   2. Mixed hyperlipidemia   3. Essential hypertension   4. Diastolic dysfunction    PLAN:    In order of problems listed above:  Secondary prevention discussed Lipid status is excellent with LDL of 83 last April.  May need to increase statin to 80 mg daily to get less than 70\ Blood pressure is elevated.  Start HCTZ 12.5 mg Monday,  Wednesday, and Friday.  If blood pressure continues to run above 140/90 mmHg will need to increase to daily dosing.  If daily dosing becomes necessary, we will need to do a basic metabolic panel after 1 week of therapy. No shortness of breath.  Consider SGLT2 therapy in the future if needed for shortness of breath.  Overall education and awareness concerning secondary risk prevention was discussed in detail: LDL less than 70, hemoglobin A1c less than 7, blood pressure target less than 130/80 mmHg, >150 minutes of moderate aerobic activity per week, avoidance of smoking, weight control (via diet and exercise), and continued surveillance/management of/for obstructive sleep apnea.  Target BP: <130/80 mmHg  Diet and lifestyle measures for BP control were reviewed in detail: Low sodium diet (<2.5 gm daily); alcohol restriction (<3 ounces per day); weight loss (Mediterranean); avoid non-steroidal agents; > 6 hours sleep per day; 150 min moderate exercise per week. Medical regimen will include at least 2 agents. Resistant hypertension if not controlled on 3 agents. Consider further evaluation: Sleep study to r/o OSA; Renal angiogram; Primary hyperaldonism and Pheochromocytoma w/u. After 3 agents, consider MRA (spironolactone)/ Epleronone), hydralazine, beta-blocker, and Minoxidil if not already in use due to patient profile.     Medication Adjustments/Labs and Tests Ordered: Current medicines are reviewed at length with  the patient today.  Concerns regarding medicines are outlined above.  No orders of the defined types were placed in this encounter.  No orders of the defined types were placed in this encounter.   There are no Patient Instructions on file for this visit.   Signed, Sinclair Grooms, MD  03/31/2021 3:58 PM    [facility]

## 2021-03-31 ENCOUNTER — Other Ambulatory Visit: Payer: Self-pay

## 2021-03-31 ENCOUNTER — Ambulatory Visit: Payer: BC Managed Care – PPO | Admitting: Interventional Cardiology

## 2021-03-31 ENCOUNTER — Encounter: Payer: Self-pay | Admitting: Interventional Cardiology

## 2021-03-31 VITALS — BP 152/88 | HR 57 | Ht 73.0 in | Wt 229.0 lb

## 2021-03-31 DIAGNOSIS — E782 Mixed hyperlipidemia: Secondary | ICD-10-CM | POA: Diagnosis not present

## 2021-03-31 DIAGNOSIS — I10 Essential (primary) hypertension: Secondary | ICD-10-CM | POA: Diagnosis not present

## 2021-03-31 DIAGNOSIS — I2510 Atherosclerotic heart disease of native coronary artery without angina pectoris: Secondary | ICD-10-CM | POA: Diagnosis not present

## 2021-03-31 DIAGNOSIS — I5189 Other ill-defined heart diseases: Secondary | ICD-10-CM

## 2021-03-31 MED ORDER — HYDROCHLOROTHIAZIDE 12.5 MG PO CAPS: 12.5000 mg | ORAL_CAPSULE | ORAL | 3 refills | Status: DC

## 2021-03-31 NOTE — Patient Instructions (Signed)
Medication Instructions:  1) START Hydrochlorothiazide 12.5mg  once daily.  Monitor your blood pressure 2-3 times a week for a month and then call with those readings or send them through Mychart.  *If you need a refill on your cardiac medications before your next appointment, please call your pharmacy*   Lab Work: None If you have labs (blood work) drawn today and your tests are completely normal, you will receive your results only by: MyChart Message (if you have MyChart) OR A paper copy in the mail If you have any lab test that is abnormal or we need to change your treatment, we will call you to review the results.   Testing/Procedures: None   Follow-Up: At [facility], you and your health needs are our priority.  As part of our continuing mission to provide you with exceptional heart care, we have created designated Provider Care Teams.  These Care Teams include your primary Cardiologist (physician) and Advanced Practice Providers (APPs -  Physician Assistants and Nurse Practitioners) who all work together to provide you with the care you need, when you need it.  We recommend signing up for the patient portal called "MyChart".  Sign up information is provided on this After Visit Summary.  MyChart is used to connect with patients for Virtual Visits (Telemedicine).  Patients are able to view lab/test results, encounter notes, upcoming appointments, etc.  Non-urgent messages can be sent to your provider as well.   To learn more about what you can do with MyChart, go to [email or web address].    Your next appointment:   1 year(s)  The format for your next appointment:   In Person  Provider:   Lesleigh Noe, MD     Other Instructions

## 2021-05-18 ENCOUNTER — Other Ambulatory Visit: Payer: Self-pay | Admitting: Interventional Cardiology

## 2021-05-24 ENCOUNTER — Other Ambulatory Visit: Payer: Self-pay | Admitting: Interventional Cardiology

## 2021-09-12 ENCOUNTER — Telehealth: Payer: Self-pay | Admitting: Interventional Cardiology

## 2021-09-12 ENCOUNTER — Telehealth: Payer: Self-pay | Admitting: *Deleted

## 2021-09-12 NOTE — Telephone Encounter (Signed)
   [facility] Pre-operative Risk Assessment    Request for surgical clearance:  What type of surgery is being performed?  Right Reverse Total Shoulder Arthroplasty  When is this surgery scheduled?  TBD   What type of clearance is required (medical clearance vs. Pharmacy clearance to hold med vs. Both)?  Both  Are there any medications that need to be held prior to surgery and how long? Their office is requesting our recommendation regarding medications  Practice name and name of physician performing surgery?  [facility]  Dr. Malena Catholic    What is your office phone number? [phone]    7.   What is your office fax number? [phone]  8.   Anesthesia type (None, local, MAC, general) ?  Choice    Mark Levine 09/12/2021, 9:59 AM

## 2021-09-12 NOTE — Telephone Encounter (Signed)
   Name: Mark Levine  DOB: [birth year]  MRN: [id number]  Primary Cardiologist: Lesleigh Noe, MD  Chart reviewed as part of pre-operative protocol coverage. Because of [name]'s past medical history and time since last visit, he will require a follow-up telephone visit in order to better assess preoperative cardiovascular risk.  Pre-op covering staff: - Please schedule appointment and call patient to inform them. If patient already had an upcoming appointment within acceptable timeframe, please add "pre-op clearance" to the appointment notes so provider is aware. - Please contact requesting surgeon's office via preferred method (i.e, phone, fax) to inform them of need for appointment prior to surgery.  Patient with remote stenting of RCA back in 2015.  Per office DAPT protocol okay to hold ASA x 7 days prior to the procedure.  Please restart when medically safe to do so.  Sharlene Dory, PA-C  09/12/2021, 12:45 PM

## 2021-09-12 NOTE — Telephone Encounter (Signed)
S/w the pt and he tells me he is not going to have his surgery until Nov 2023. Pt opted to set up tele preop appt 11/10/21 @ 2:40. Med rec and consent are done.

## 2021-09-12 NOTE — Telephone Encounter (Signed)
S/w the pt and he tells me he is not going to have his surgery until Nov 2023. Pt opted to set up tele preop appt 11/10/21 @ 2:40. Med rec and consent are done.     Patient Consent for Virtual Visit        Mark Levine has provided verbal consent on 09/12/2021 for a virtual visit (video or telephone).   CONSENT FOR VIRTUAL VISIT FOR:  Mark Levine  By participating in this virtual visit I agree to the following:  I hereby voluntarily request, consent and authorize [facility] and its employed or contracted physicians, physician assistants, nurse practitioners or other licensed health care professionals (the Practitioner), to provide me with telemedicine health care services (the "Services") as deemed necessary by the treating Practitioner. I acknowledge and consent to receive the Services by the Practitioner via telemedicine. I understand that the telemedicine visit will involve communicating with the Practitioner through live audiovisual communication technology and the disclosure of certain medical information by electronic transmission. I acknowledge that I have been given the opportunity to request an in-person assessment or other available alternative prior to the telemedicine visit and am voluntarily participating in the telemedicine visit.  I understand that I have the right to withhold or withdraw my consent to the use of telemedicine in the course of my care at any time, without affecting my right to future care or treatment, and that the Practitioner or I may terminate the telemedicine visit at any time. I understand that I have the right to inspect all information obtained and/or recorded in the course of the telemedicine visit and may receive copies of available information for a reasonable fee.  I understand that some of the potential risks of receiving the Services via telemedicine include:  Delay or interruption in medical evaluation due to technological equipment failure or  disruption; Information transmitted may not be sufficient (e.g. poor resolution of images) to allow for appropriate medical decision making by the Practitioner; and/or  In rare instances, security protocols could fail, causing a breach of personal health information.  Furthermore, I acknowledge that it is my responsibility to provide information about my medical history, conditions and care that is complete and accurate to the best of my ability. I acknowledge that Practitioner's advice, recommendations, and/or decision may be based on factors not within their control, such as incomplete or inaccurate data provided by me or distortions of diagnostic images or specimens that may result from electronic transmissions. I understand that the practice of medicine is not an exact science and that Practitioner makes no warranties or guarantees regarding treatment outcomes. I acknowledge that a copy of this consent can be made available to me via my patient portal [facility] MyChart), or I can request a printed copy by calling the office of [facility].    I understand that my insurance will be billed for this visit.   I have read or had this consent read to me. I understand the contents of this consent, which adequately explains the benefits and risks of the Services being provided via telemedicine.  I have been provided ample opportunity to ask questions regarding this consent and the Services and have had my questions answered to my satisfaction. I give my informed consent for the services to be provided through the use of telemedicine in my medical care

## 2021-11-10 ENCOUNTER — Ambulatory Visit: Payer: BC Managed Care – PPO | Attending: Internal Medicine | Admitting: Nurse Practitioner

## 2021-11-10 DIAGNOSIS — Z01810 Encounter for preprocedural cardiovascular examination: Secondary | ICD-10-CM

## 2021-11-10 NOTE — Progress Notes (Signed)
Virtual Visit via Telephone Note   Because of Mark Levine's co-morbid illnesses, he is at least at moderate risk for complications without adequate follow up.  This format is felt to be most appropriate for this patient at this time.  The patient did not have access to video technology/had technical difficulties with video requiring transitioning to audio format only (telephone).  All issues noted in this document were discussed and addressed.  No physical exam could be performed with this format.  Please refer to the patient's chart for his consent to telehealth for [facility].  Evaluation Performed:  Preoperative cardiovascular risk assessment _____________   Date:  11/10/2021   Patient ID:  Mark Levine, DOB 04-01-1955, MRN [id number] Patient Location:  Home Provider location:   Office  Primary Care Provider:  [name], MD Primary Cardiologist:  Sinclair Grooms, MD  Chief Complaint / Patient Profile   66 y.o. y/o male with a h/o CAD s/p DES to LAD in 2015, no ischemia on NST 02/2015, hypertension, hyperlipidemia, Grade 2 diastolic dysfunctionon echo 03/2019 who is pending right reverse total shoulder arthroplasty and presents today for telephonic preoperative cardiovascular risk assessment.  Appointment cancelled per patient request to reschedule surgery   Past Medical History    Past Medical History:  Diagnosis Date   CAD (coronary artery disease)    a. Inf STEMI5/15 >> LHC (04/02/13): ostial left main 20-25, apical LAD 70, mid to distal RCA occluded, EF 50% inferior HK. >> PCI:  Promus premier (16 x 3 mm) DES to the RCA.    CAD (coronary artery disease), native coronary artery 05/10/2014   DES RCA during inferior STEMI    Cough due to ACE inhibitor 05/11/2014   Lisinopril discontinued 05/11/14    Erectile dysfunction 10/11/2013   Essential hypertension 05/10/2014   History of nuclear stress test    a.Myoview 1/17: EF 48%, apical and apical lateral scar, no  ischemia; Intermediate Risk (low EF)   Hyperlipidemia    MI, 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   Inferior STEMI  04/02/13    Past Surgical History:  Procedure Laterality Date   LEFT HEART CATHETERIZATION WITH CORONARY ANGIOGRAM N/A 04/02/2013   Procedure: LEFT HEART CATHETERIZATION WITH CORONARY ANGIOGRAM;  Surgeon: Sinclair Grooms, MD;  Location: [facility] C[facility] LAB;  Service: Cardiovascular;  Laterality: N/A;   NO PAST SURGERIES      Allergies  Allergies  Allergen Reactions   Other Swelling    All nuts " throw swelling"   Shellfish Allergy Hives    History of Present Illness    Mark Levine is a 66 y.o. male who presents via audio/video conferencing for a telehealth visit today.  Pt was last seen in cardiology clinic on 03/31/21 by Dr. Tamala Julian.  At that time KENYATA GUESS was doing well with slightly elevated BP for which HCTZ was added.  The patient is now pending procedure as outlined above. Since his last visit, he   Home Medications    Prior to Admission medications   Medication Sig Start Date End Date Taking? Authorizing Provider  acetaminophen-codeine (TYLENOL #3) 300-30 MG per tablet Take 1 tablet by mouth every 4 (four) hours as needed for moderate pain.    [provider]  aspirin EC 81 MG EC tablet Take 1 tablet (81 mg total) by mouth daily. 04/05/13   Lyda Jester M, PA-C  atorvastatin (LIPITOR) 40 MG tablet TAKE 1/2 TABLET BY MOUTH DAILY AT 6PM. PLEASE MAKE YEARLY  APPT W/DR. SMITH FOR FUTURE REFILLS 05/26/21   Lyn Records, MD  hydrochlorothiazide (MICROZIDE) 12.5 MG capsule Take 1 capsule (12.5 mg total) by mouth every Monday, Wednesday, and Friday. 03/31/21   Lyn Records, MD  metoprolol tartrate (LOPRESSOR) 25 MG tablet TAKE 1 TABLET BY MOUTH TWICE A DAY 05/20/21   Lyn Records, MD  nitroGLYCERIN (NITROSTAT) 0.4 MG SL tablet Place 1 tablet (0.4 mg total) under the tongue every 5 (five) minutes x 3 doses as needed for chest pain. 02/06/15   Tereso Newcomer T, PA-C  traZODone  (DESYREL) 50 MG tablet Take 1 tablet by mouth as needed. 12/30/20   [provider]  VIAGRA 100 MG tablet Take 100 mg by mouth daily as needed for erectile dysfunction.  04/27/14   [provider]    Physical Exam    Vital Signs:  [name] does not have vital signs available for review today.  Given telephonic nature of communication, physical exam is limited. AAOx3. NAD. Normal affect.  Speech and respirations are unlabored.  Accessory Clinical Findings    None  Assessment & Plan    1.  Preoperative Cardiovascular Risk Assessment:  The patient was advised that if he develops new symptoms prior to surgery to contact our office to arrange for a follow-up visit, and he verbalized understanding.  Patient with remote stenting of RCA back in 2015.  Per office DAPT protocol okay to hold ASA x 7 days prior to the procedure.  Please restart when medically safe to do so.  A copy of this note will be routed to requesting surgeon.  Time:   Today, I have spent  minutes with the patient with telehealth technology discussing medical history, symptoms, and management plan.     Levi Aland, NP  11/10/2021, 2:30 PM

## 2021-11-10 NOTE — Telephone Encounter (Signed)
Called patient for virtual visit appointment scheduled for today 11/10/21. He reports he is postponing this surgery until January 2024. I advised that I will cancel today's appointment. He requested an in-person appointment for January. Appointment with Richardson Dopp, PA made for 02/10/22 at which time surgical clearance can be addressed.  I am forwarding this note to requesting provider and closing this encounter.   Emmaline Life, NP-C  11/10/2021, 2:28 PM [address] Office [phone] Fax [phone]

## 2022-01-04 NOTE — Progress Notes (Unsigned)
Cardiology Office Note    Date:  01/05/2022   ID:  Mark Levine, DOB [birth year], MRN [id number]  PCP:  [name], MD  Cardiologist:  Sinclair Grooms, MD  Electrophysiologist:  None   Chief Complaint: follow up for surgical clearance, CAD, HTN  History of Present Illness:   Mark Levine is a 66 y.o. male with history of CAD, DES to the LAD in 2015, HTN, HLD, Grade II DD on echo 03/2019, and ED.   Patient presents today for surgical clearance for left knee hemiarthroplasty. From a cardiac perspective, he is doing well. 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 He denies any CP, SOB, palpitations, orthopnea, pedal edema, presyncope or syncope. At his last visit, his BP was 150/88 and he was to start HCTZ MWF, however he stated this caused him to feel dizzy and he stopped taking it after about a month. He spot checks his BP and reports that it is "usually good" but he cannot articulate the exact reading, just that it was not as high as 150/88. He is planning to undergo a left knee hemiarthroplasty, hopefully before March 2024.      Labwork independently reviewed: 06/04/21 gfr 73, TSH 0.88 05/28/20 BUN 21, Cr 0.9, LDL 83    Cardiology Studies:   Studies reviewed are outlined and summarized above. Reports included below if pertinent.   12/12/19 [place] lower extremity DVT - no evidence of DVT  2 D Doppler ECHOCARDIOGRAM 03/2019: IMPRESSIONS   1. Definity used; hypokinesis of the basal inferolateral wall with  overall preserved LV systolic function; grade 2 diastolic dysfunction;  mild LVH; mild MR.   2. Left ventricular ejection fraction, by estimation, is 55 to 60%. The  left ventricle has normal function. The left ventrical demonstrates  regional wall motion abnormalities (see scoring diagram/findings for  description). There is mildly increased left   ventricular hypertrophy. Left ventricular diastolic parameters are  consistent with Grade  II diastolic dysfunction (pseudonormalization).   3. Right ventricular systolic function is normal. The right ventricular  size is normal.   4. The mitral valve is normal in structure and function. Mild mitral  valve regurgitation. No evidence of mitral stenosis.   5. The aortic valve is tricuspid. Aortic valve regurgitation is not  visualized. No aortic stenosis is present.   6. The inferior vena cava is normal in size with greater than 50%  respiratory variability, suggesting right atrial pressure of 3 mmHg.  02/15/15 Myocardial perfusion imaging - Small-size, mild intensity fixed apical/apical lateral perfusion defect with associated hypokinesis. This is most likely a small area of scar. LVEF 48%. This is an intermediate risk study, due to moderate LV dysfunction. There is no reversible ischemia.         Past Medical History:  Diagnosis Date   CAD (coronary artery disease)    a. Inf STEMI5/15 >> LHC (04/02/13): ostial left main 20-25, apical LAD 70, mid to distal RCA occluded, EF 50% inferior HK. >> PCI:  Promus premier (16 x 3 mm) DES to the RCA.    CAD (coronary artery disease), native coronary artery 05/10/2014   DES RCA during inferior STEMI    Cough due to ACE inhibitor 05/11/2014   Lisinopril discontinued 05/11/14    Erectile dysfunction 10/11/2013   Essential hypertension 05/10/2014   History of nuclear stress test    a.Myoview 1/17: EF 48%, apical and apical lateral scar, no ischemia; Intermediate Risk (low  EF)   Hyperlipidemia    MI, 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   Inferior STEMI  04/02/13     Past Surgical History:  Procedure Laterality Date   LEFT HEART CATHETERIZATION WITH CORONARY ANGIOGRAM N/A 04/02/2013   Procedure: LEFT HEART CATHETERIZATION WITH CORONARY ANGIOGRAM;  Surgeon: Lesleigh Noe, MD;  Location: [facility] C[facility] LAB;  Service: Cardiovascular;  Laterality: N/A;   NO PAST SURGERIES      Current Medications: Current Meds  Medication Sig   acetaminophen-codeine (TYLENOL #3) 300-30 MG per  tablet Take 1 tablet by mouth every 4 (four) hours as needed for moderate pain.   aspirin EC 81 MG EC tablet Take 1 tablet (81 mg total) by mouth daily.   atorvastatin (LIPITOR) 40 MG tablet TAKE 1/2 TABLET BY MOUTH DAILY AT 6PM. PLEASE MAKE YEARLY APPT W/DR. SMITH FOR FUTURE REFILLS   metoprolol tartrate (LOPRESSOR) 25 MG tablet TAKE 1 TABLET BY MOUTH TWICE A DAY   nitroGLYCERIN (NITROSTAT) 0.4 MG SL tablet Place 1 tablet (0.4 mg total) under the tongue every 5 (five) minutes x 3 doses as needed for chest pain.   traZODone (DESYREL) 50 MG tablet Take 1 tablet by mouth as needed.   VIAGRA 100 MG tablet Take 100 mg by mouth daily as needed for erectile dysfunction.       Allergies:   Other and Shellfish allergy   Social History   Socioeconomic History   Marital status: Married    Spouse name: Not on file   Number of children: Not on file   Years of education: Not on file   Highest education level: Not on file  Occupational History   Not on file  Tobacco Use   Smoking status: Never   Smokeless tobacco: Never  Vaping Use   Vaping Use: Never used  Substance and Sexual Activity   Alcohol use: No    Alcohol/week: 0.0 standard drinks of alcohol   Drug use: No   Sexual activity: Not on file  Other Topics Concern   Not on file  Social History Narrative   Not on file   Social Determinants of Health   Financial Resource Strain: Not on file  Food Insecurity: Not on file  Transportation Needs: Not on file  Physical Activity: Not on file  Stress: Not on file  Social Connections: Not on file     Family History:  The patient's family history includes Other in his father.  ROS:   Review of Systems  Constitutional: Negative.   HENT: Negative.    Eyes: Negative.   Respiratory:  Negative for shortness of breath and wheezing.   Cardiovascular:  Negative for chest pain, palpitations, orthopnea and leg swelling.  Gastrointestinal: Negative.   Genitourinary: Negative.    Musculoskeletal:  Positive for joint pain (left knee, right and left shoulder) and myalgias.  Skin: Negative.   Neurological:  Positive for dizziness (with HCTZ, none since he stopped taking).  Endo/Heme/Allergies: Negative.   Psychiatric/Behavioral: Negative.        EKG(s)/Additional Labs   EKG:  EKG is ordered today, personally reviewed, demonstrating NSR, 67 bpm.   Recent Labs: No results found for requested labs within last 365 days.  Recent Lipid Panel    Component Value Date/Time   CHOL 115 (L) 02/15/2015 0737   TRIG 65 02/15/2015 0737   HDL 40 02/15/2015 0737   CHOLHDL 2.9 02/15/2015 0737   VLDL 13 02/15/2015 0737   LDLCALC 62 02/15/2015 0737    PHYSICAL EXAM:  VS:  BP 120/72   Pulse 67   Ht 6' (1.829 m)   Wt 225 lb 12.8 oz (102.4 kg)   SpO2 98%   BMI 30.62 kg/m   BMI: Body mass index is 30.62 kg/m.  GEN: Well nourished, well developed male in no acute distress HEENT: normocephalic, atraumatic Neck: no JVD, carotid bruits, or masses Cardiac: RRR; murmur heard left sternal border, rubs, or gallops, no edema  Respiratory:  clear to auscultation bilaterally, normal work of breathing GI: soft, nontender, nondistended, + BS MS: no deformity or atrophy Skin: warm and dry, no rash Neuro:  Alert and Oriented x 3, Strength and sensation are intact, follows commands Psych: euthymic mood, full affect  Wt Readings from Last 3 Encounters:  01/05/22 225 lb 12.8 oz (102.4 kg)  03/31/21 229 lb (103.9 kg)  04/03/20 224 lb 12.8 oz (102 kg)     ASSESSMENT & PLAN:   CAD - No CP, SOB. Continue ASA, metoprolol. Continues to be very active.  HTN - BP today 120/72, well controlled. Took HCTZ 12.5 mg MWF for ~ month, but was noticing dizziness so he d/c'd this. He sporadically checks his BP. Advised him to check weekly if possible and let us know if he is getting any elevated readings.  HLD - lipitor 20mg  QD, LDL 83 05/28/20, managed by PCP Diastolic dysfunction - EF  55-60% with mild LVH, consider adding SGLT2 at future visit. NYHA class I. BP well controlled, very active with work and golf on the weekends.  Surgical clearance -     Mr. Grzelak's perioperative risk of a major cardiac event is 0.9% according to the Revised Cardiac Risk Index (RCRI).  Therefore, he is at low risk for perioperative complications.   His functional capacity is good at 8.97 METs according to the Duke Activity Status Index (DASI). Recommendations: According to ACC/AHA guidelines, no further cardiovascular testing needed.  The patient may proceed to surgery at acceptable risk.   Antiplatelet and/or Anticoagulation Recommendations: Aspirin can be held for 7 days prior to his surgery.  Please resume Aspirin post operatively when it is felt to be safe from a bleeding standpoint.        Disposition: F/u with Dr. Johney Frame in 1 year.    Medication Adjustments/Labs and Tests Ordered: Current medicines are reviewed at length with the patient today.  Concerns regarding medicines are outlined above. Medication changes, Labs and Tests ordered today are summarized above and listed in the Patient Instructions accessible in Encounters.   Signed, Trudi Ida, NP  01/05/2022 3:20 PM    [place] Phone: [phone]; Fax: [phone]

## 2022-01-05 ENCOUNTER — Ambulatory Visit: Payer: BC Managed Care – PPO | Attending: Physician Assistant | Admitting: Cardiology

## 2022-01-05 ENCOUNTER — Encounter: Payer: Self-pay | Admitting: Physician Assistant

## 2022-01-05 VITALS — BP 120/72 | HR 67 | Ht 72.0 in | Wt 225.8 lb

## 2022-01-05 DIAGNOSIS — I2510 Atherosclerotic heart disease of native coronary artery without angina pectoris: Secondary | ICD-10-CM | POA: Diagnosis not present

## 2022-01-05 DIAGNOSIS — I5189 Other ill-defined heart diseases: Secondary | ICD-10-CM

## 2022-01-05 DIAGNOSIS — Z01810 Encounter for preprocedural cardiovascular examination: Secondary | ICD-10-CM | POA: Diagnosis not present

## 2022-01-05 DIAGNOSIS — E782 Mixed hyperlipidemia: Secondary | ICD-10-CM | POA: Diagnosis not present

## 2022-01-05 NOTE — Patient Instructions (Signed)
Medication Instructions:   HOLD ASPIRIN 7 DAYS PRIOR TO SURGERY ( YOUR NOTE WILL BE FAXED OVER TO SURGEON OFFICE)  *If you need a refill on your cardiac medications before your next appointment, please call your pharmacy*   Lab Work: NONE ORDERED  TODAY    If you have labs (blood work) drawn today and your tests are completely normal, you will receive your results only by: MyChart Message (if you have MyChart) OR A paper copy in the mail If you have any lab test that is abnormal or we need to change your treatment, we will call you to review the results.   Testing/Procedures: NONE ORDERED  TODAY      Follow-Up: At [facility], you and your health needs are our priority.  As part of our continuing mission to provide you with exceptional heart care, we have created designated Provider Care Teams.  These Care Teams include your primary Cardiologist (physician) and Advanced Practice Providers (APPs -  Physician Assistants and Nurse Practitioners) who all work together to provide you with the care you need, when you need it.  We recommend signing up for the patient portal called "MyChart".  Sign up information is provided on this After Visit Summary.  MyChart is used to connect with patients for Virtual Visits (Telemedicine).  Patients are able to view lab/test results, encounter notes, upcoming appointments, etc.  Non-urgent messages can be sent to your provider as well.   To learn more about what you can do with MyChart, go to [email or web address].    Your next appointment:   1 year(s)  The format for your next appointment:   In Person  Provider:    Dr. Laurance Flatten    Other Instructions   Important Information About Sugar

## 2022-01-11 ENCOUNTER — Other Ambulatory Visit: Payer: Self-pay | Admitting: Interventional Cardiology

## 2022-02-10 ENCOUNTER — Ambulatory Visit: Payer: BC Managed Care – PPO | Admitting: Physician Assistant

## 2022-03-06 ENCOUNTER — Other Ambulatory Visit: Payer: Self-pay

## 2022-03-06 MED ORDER — METOPROLOL TARTRATE 25 MG PO TABS: 25.0000 mg | ORAL_TABLET | Freq: Two times a day (BID) | ORAL | 3 refills | Status: DC

## 2022-03-16 ENCOUNTER — Other Ambulatory Visit: Payer: Self-pay

## 2022-03-16 MED ORDER — ATORVASTATIN CALCIUM 40 MG PO TABS: 20.0000 mg | ORAL_TABLET | Freq: Every day | ORAL | 3 refills | Status: DC

## 2022-12-28 DIAGNOSIS — F5101 Primary insomnia: Secondary | ICD-10-CM | POA: Diagnosis not present

## 2022-12-28 DIAGNOSIS — I10 Essential (primary) hypertension: Secondary | ICD-10-CM | POA: Diagnosis not present

## 2022-12-28 DIAGNOSIS — R7303 Prediabetes: Secondary | ICD-10-CM | POA: Diagnosis not present

## 2022-12-28 DIAGNOSIS — Z125 Encounter for screening for malignant neoplasm of prostate: Secondary | ICD-10-CM | POA: Diagnosis not present

## 2022-12-28 DIAGNOSIS — I2510 Atherosclerotic heart disease of native coronary artery without angina pectoris: Secondary | ICD-10-CM | POA: Diagnosis not present

## 2023-01-05 DIAGNOSIS — M1712 Unilateral primary osteoarthritis, left knee: Secondary | ICD-10-CM | POA: Diagnosis not present

## 2023-01-05 DIAGNOSIS — I10 Essential (primary) hypertension: Secondary | ICD-10-CM | POA: Diagnosis not present

## 2023-01-05 DIAGNOSIS — Z0000 Encounter for general adult medical examination without abnormal findings: Secondary | ICD-10-CM | POA: Diagnosis not present

## 2023-01-05 DIAGNOSIS — I252 Old myocardial infarction: Secondary | ICD-10-CM | POA: Diagnosis not present

## 2023-01-05 DIAGNOSIS — N529 Male erectile dysfunction, unspecified: Secondary | ICD-10-CM | POA: Diagnosis not present

## 2023-01-05 DIAGNOSIS — I2510 Atherosclerotic heart disease of native coronary artery without angina pectoris: Secondary | ICD-10-CM | POA: Diagnosis not present

## 2023-01-05 DIAGNOSIS — R7303 Prediabetes: Secondary | ICD-10-CM | POA: Diagnosis not present

## 2023-01-05 DIAGNOSIS — F5101 Primary insomnia: Secondary | ICD-10-CM | POA: Diagnosis not present

## 2023-01-05 DIAGNOSIS — K4090 Unilateral inguinal hernia, without obstruction or gangrene, not specified as recurrent: Secondary | ICD-10-CM | POA: Diagnosis not present

## 2023-01-06 DIAGNOSIS — M25562 Pain in left knee: Secondary | ICD-10-CM | POA: Diagnosis not present

## 2023-01-06 DIAGNOSIS — M6281 Muscle weakness (generalized): Secondary | ICD-10-CM | POA: Diagnosis not present

## 2023-01-11 DIAGNOSIS — M6281 Muscle weakness (generalized): Secondary | ICD-10-CM | POA: Diagnosis not present

## 2023-01-11 DIAGNOSIS — M25562 Pain in left knee: Secondary | ICD-10-CM | POA: Diagnosis not present

## 2023-01-18 DIAGNOSIS — M6281 Muscle weakness (generalized): Secondary | ICD-10-CM | POA: Diagnosis not present

## 2023-01-18 DIAGNOSIS — M25562 Pain in left knee: Secondary | ICD-10-CM | POA: Diagnosis not present

## 2023-01-19 ENCOUNTER — Other Ambulatory Visit: Payer: Self-pay | Admitting: Cardiovascular Disease

## 2023-01-19 DIAGNOSIS — J029 Acute pharyngitis, unspecified: Secondary | ICD-10-CM | POA: Diagnosis not present

## 2023-01-19 MED ORDER — ATORVASTATIN CALCIUM 40 MG PO TABS: 20.0000 mg | ORAL_TABLET | Freq: Every day | ORAL | 0 refills | Status: DC

## 2023-01-20 DIAGNOSIS — M25562 Pain in left knee: Secondary | ICD-10-CM | POA: Diagnosis not present

## 2023-01-20 DIAGNOSIS — M6281 Muscle weakness (generalized): Secondary | ICD-10-CM | POA: Diagnosis not present

## 2023-02-25 NOTE — Progress Notes (Signed)
Chief Complaint  Patient presents with   Follow-up    CAD   History of Present Illness: 68 yo male with history of CAD, HTN and HLD here today for follow up. He has been followed in the past by Dr. Katrinka Blazing. He had an MI in 2015 and had a stent placed in the LAD. Echo in February 2021 with LVEF=55-60%. Grade 2 diastolic dysfunction. Mild MR.   He is here today for follow up. The patient denies any chest pain, dyspnea, palpitations, lower extremity edema, orthopnea, PND, dizziness, near syncope or syncope.   Primary Care Physician: Pearson Grippe, MD   Past Medical History:  Diagnosis Date   CAD (coronary artery disease)    a. Inf STEMI5/15 >> LHC (04/02/13): ostial left main 20-25, apical LAD 70, mid to distal RCA occluded, EF 50% inferior HK. >> PCI:  Promus premier (16 x 3 mm) DES to the RCA.    CAD (coronary artery disease), native coronary artery 05/10/2014   DES RCA during inferior STEMI    Cough due to ACE inhibitor 05/11/2014   Lisinopril discontinued 05/11/14    Erectile dysfunction 10/11/2013   Essential hypertension 05/10/2014   History of nuclear stress test    a.Myoview 1/17: EF 48%, apical and apical lateral scar, no ischemia; Intermediate Risk (low EF)   Hyperlipidemia    MI, 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   Inferior STEMI  04/02/13     Past Surgical History:  Procedure Laterality Date   LEFT HEART CATHETERIZATION WITH CORONARY ANGIOGRAM N/A 04/02/2013   Procedure: LEFT HEART CATHETERIZATION WITH CORONARY ANGIOGRAM;  Surgeon: Lesleigh Noe, MD;  Location: [facility] CATH LAB;  Service: Cardiovascular;  Laterality: N/A;   NO PAST SURGERIES      Current Outpatient Medications  Medication Sig Dispense Refill   acetaminophen-codeine (TYLENOL #3) 300-30 MG per tablet Take 1 tablet by mouth every 4 (four) hours as needed for moderate pain.     aspirin EC 81 MG EC tablet Take 1 tablet (81 mg total) by mouth daily.     metoprolol tartrate (LOPRESSOR) 25 MG tablet Take 1 tablet (25 mg total) by mouth 2 (two)  times daily. 180 tablet 3   nitroGLYCERIN (NITROSTAT) 0.4 MG SL tablet Place 1 tablet (0.4 mg total) under the tongue every 5 (five) minutes x 3 doses as needed for chest pain. 25 tablet 2   traZODone (DESYREL) 50 MG tablet Take 1 tablet by mouth as needed.     VIAGRA 100 MG tablet Take 100 mg by mouth daily as needed for erectile dysfunction.   1   atorvastatin (LIPITOR) 40 MG tablet Take 40 mg by mouth daily.     No current facility-administered medications for this visit.    Allergies  Allergen Reactions   Other Swelling    All nuts " throw swelling"   Shellfish Allergy Hives    Social History   Socioeconomic History   Marital status: Married    Spouse name: Not on file   Number of children: Not on file   Years of education: Not on file   Highest education level: Not on file  Occupational History   Not on file  Tobacco Use   Smoking status: Never   Smokeless tobacco: Never  Vaping Use   Vaping status: Never Used  Substance and Sexual Activity   Alcohol use: No    Alcohol/week: 0.0 standard drinks of alcohol   Drug use: No   Sexual activity: Not on file  Other Topics Concern   Not on file  Social History Narrative   Not on file   Social Drivers of Health   Financial Resource Strain: Not on file  Food Insecurity: Not on file  Transportation Needs: Not on file  Physical Activity: Not on file  Stress: Not on file  Social Connections: Not on file  Intimate Partner Violence: Not on file    Family History  Problem Relation Age of Onset   Other Father     Review of Systems:  As stated in the HPI and otherwise negative.   BP 128/74   Pulse (!) 59   Ht 6' (1.829 m)   Wt 105.7 kg   SpO2 99%   BMI 31.60 kg/m   Physical Examination: General: Well developed, well nourished, NAD  HEENT: OP clear, mucus membranes moist  SKIN: warm, dry. No rashes. Neuro: No focal deficits  Musculoskeletal: Muscle strength 5/5 all ext  Psychiatric: Mood and affect normal   Neck: No JVD, no carotid bruits, no thyromegaly, no lymphadenopathy.  Lungs:Clear bilaterally, no wheezes, rhonci, crackles Cardiovascular: Regular rate and rhythm. No murmurs, gallops or rubs. Abdomen:Soft. Bowel sounds present. Non-tender.  Extremities: No lower extremity edema. Pulses are 2 + in the bilateral DP/PT.  EKG:  EKG is ordered today. The ekg ordered today demonstrates EKG Interpretation Date/Time:  Friday February 26 2023 08:10:39 EST Ventricular Rate:  59 PR Interval:  172 QRS Duration:  100 QT Interval:  376 QTC Calculation: 372 R Axis:   37  Text Interpretation: Sinus bradycardia Nonspecific T wave abnormality Confirmed by Verne Carrow [phone]) on 02/26/2023 8:15:57 AM    Recent Labs: No results found for requested labs within last 365 days.   Lipid Panel    Component Value Date/Time   CHOL 115 (L) 02/15/2015 0737   TRIG 65 02/15/2015 0737   HDL 40 02/15/2015 0737   CHOLHDL 2.9 02/15/2015 0737   VLDL 13 02/15/2015 0737   LDLCALC 62 02/15/2015 0737     Wt Readings from Last 3 Encounters:  02/26/23 105.7 kg  01/05/22 102.4 kg  03/31/21 103.9 kg    Assessment and Plan:   1. CAD without angina: No chest pain suggestive of angina. Continue ASA, statin and beta blocker.   2. Mitral regurgitation: Mild MR by echo in 2021.   3. HTN: BP is well controlled. No changes today  4. Hyperlipidemia: Lipids followed in primary care. LDL was 79 in November 2024. His Lipitor was increased to 40 mg daily following the labs in November 204. Continue statin  Labs/ tests ordered today include:   Orders Placed This Encounter  Procedures   EKG 12-Lead   Disposition:   F/U with me in 12 months    Signed, Verne Carrow, MD, [facility] 02/26/2023 9:31 AM    [facility] [address] [place], [place], [place]  [address] Phone: [phone]; Fax: [phone]

## 2023-02-26 ENCOUNTER — Encounter: Payer: Self-pay | Admitting: Cardiovascular Disease

## 2023-02-26 ENCOUNTER — Ambulatory Visit: Payer: Medicare HMO | Attending: Cardiovascular Disease | Admitting: Cardiovascular Disease

## 2023-02-26 VITALS — BP 128/74 | HR 59 | Ht 72.0 in | Wt 233.0 lb

## 2023-02-26 DIAGNOSIS — I2510 Atherosclerotic heart disease of native coronary artery without angina pectoris: Secondary | ICD-10-CM

## 2023-02-26 DIAGNOSIS — I10 Essential (primary) hypertension: Secondary | ICD-10-CM

## 2023-02-26 DIAGNOSIS — E782 Mixed hyperlipidemia: Secondary | ICD-10-CM | POA: Diagnosis not present

## 2023-02-26 NOTE — Patient Instructions (Signed)
Medication Instructions:  No changes *If you need a refill on your cardiac medications before your next appointment, please call your pharmacy*   Lab Work: none If you have labs (blood work) drawn today and your tests are completely normal, you will receive your results only by: MyChart Message (if you have MyChart) OR A paper copy in the mail If you have any lab test that is abnormal or we need to change your treatment, we will call you to review the results.   Testing/Procedures: none   Follow-Up: At [facility], you and your health needs are our priority.  As part of our continuing mission to provide you with exceptional heart care, we have created designated Provider Care Teams.  These Care Teams include your primary Cardiologist (physician) and Advanced Practice Providers (APPs -  Physician Assistants and Nurse Practitioners) who all work together to provide you with the care you need, when you need it.  We recommend signing up for the patient portal called "MyChart".  Sign up information is provided on this After Visit Summary.  MyChart is used to connect with patients for Virtual Visits (Telemedicine).  Patients are able to view lab/test results, encounter notes, upcoming appointments, etc.  Non-urgent messages can be sent to your provider as well.   To learn more about what you can do with MyChart, go to [email or web address].    Your next appointment:   12 month(s)  Provider:   Verne Carrow, MD       1st Floor: - Lobby - Registration  - Pharmacy  - Lab - Cafe  2nd Floor: - PV Lab - Diagnostic Testing (echo, CT, nuclear med)  3rd Floor: - Vacant  4th Floor: - TCTS (cardiothoracic surgery) - AFib Clinic - Structural Heart Clinic - Vascular Surgery  - Vascular Ultrasound  5th Floor: - HeartCare Cardiology (general and EP) - Clinical Pharmacy for coumadin, hypertension, lipid, weight-loss medications, and med management  appointments    Valet parking services will be available as well.

## 2023-03-09 DIAGNOSIS — M545 Low back pain, unspecified: Secondary | ICD-10-CM | POA: Diagnosis not present

## 2023-03-11 ENCOUNTER — Ambulatory Visit: Payer: BC Managed Care – PPO | Admitting: Cardiovascular Disease

## 2023-03-15 ENCOUNTER — Other Ambulatory Visit: Payer: Self-pay

## 2023-03-15 MED ORDER — ATORVASTATIN CALCIUM 40 MG PO TABS: 40.0000 mg | ORAL_TABLET | Freq: Every day | ORAL | 3 refills | Status: DC

## 2023-04-15 ENCOUNTER — Other Ambulatory Visit: Payer: Self-pay | Admitting: Cardiovascular Disease

## 2023-04-16 ENCOUNTER — Other Ambulatory Visit: Payer: Self-pay

## 2023-04-16 DIAGNOSIS — E119 Type 2 diabetes mellitus without complications: Secondary | ICD-10-CM | POA: Diagnosis present

## 2023-04-16 DIAGNOSIS — E876 Hypokalemia: Secondary | ICD-10-CM | POA: Diagnosis present

## 2023-04-16 DIAGNOSIS — I429 Cardiomyopathy, unspecified: Secondary | ICD-10-CM | POA: Diagnosis present

## 2023-04-16 DIAGNOSIS — I10 Essential (primary) hypertension: Secondary | ICD-10-CM | POA: Diagnosis present

## 2023-04-16 DIAGNOSIS — I213 ST elevation (STEMI) myocardial infarction of unspecified site: Secondary | ICD-10-CM | POA: Diagnosis not present

## 2023-04-16 DIAGNOSIS — I252 Old myocardial infarction: Secondary | ICD-10-CM | POA: Diagnosis not present

## 2023-04-16 DIAGNOSIS — E785 Hyperlipidemia, unspecified: Secondary | ICD-10-CM | POA: Diagnosis present

## 2023-04-16 DIAGNOSIS — Z955 Presence of coronary angioplasty implant and graft: Secondary | ICD-10-CM | POA: Diagnosis not present

## 2023-04-16 DIAGNOSIS — R079 Chest pain, unspecified: Secondary | ICD-10-CM | POA: Diagnosis present

## 2023-04-16 DIAGNOSIS — I2111 ST elevation (STEMI) myocardial infarction involving right coronary artery: Principal | ICD-10-CM | POA: Diagnosis present

## 2023-04-16 DIAGNOSIS — Z79899 Other long term (current) drug therapy: Secondary | ICD-10-CM

## 2023-04-16 DIAGNOSIS — Z7902 Long term (current) use of antithrombotics/antiplatelets: Secondary | ICD-10-CM

## 2023-04-16 DIAGNOSIS — Z7982 Long term (current) use of aspirin: Secondary | ICD-10-CM

## 2023-04-16 DIAGNOSIS — I2510 Atherosclerotic heart disease of native coronary artery without angina pectoris: Secondary | ICD-10-CM | POA: Diagnosis present

## 2023-04-16 DIAGNOSIS — D696 Thrombocytopenia, unspecified: Secondary | ICD-10-CM | POA: Diagnosis present

## 2023-04-16 MED ORDER — METOPROLOL TARTRATE 25 MG PO TABS: 25.0000 mg | ORAL_TABLET | Freq: Two times a day (BID) | ORAL | 3 refills | Status: DC

## 2023-04-16 NOTE — ED Triage Notes (Signed)
 Pt c/o CP & "feeling lightheaded" onset approx 10p. States pain is in R side of chest. Advises "some" HA, but denies NV. "Some SHOB" w CP, none at time of triage. States he feels weak.

## 2023-04-17 ENCOUNTER — Emergency Department (HOSPITAL_BASED_OUTPATIENT_CLINIC_OR_DEPARTMENT_OTHER)

## 2023-04-17 ENCOUNTER — Encounter (HOSPITAL_BASED_OUTPATIENT_CLINIC_OR_DEPARTMENT_OTHER): Payer: Self-pay

## 2023-04-17 ENCOUNTER — Inpatient Hospital Stay (HOSPITAL_COMMUNITY)

## 2023-04-17 ENCOUNTER — Encounter (HOSPITAL_COMMUNITY)
Admission: EM | Disposition: A | Discharge status: Home / Self Care | Payer: Self-pay | Source: Home / Self Care | Attending: Cardiology

## 2023-04-17 ENCOUNTER — Inpatient Hospital Stay (HOSPITAL_BASED_OUTPATIENT_CLINIC_OR_DEPARTMENT_OTHER)
Admission: EM | Admit: 2023-04-17 | Discharge: 2023-04-18 | DRG: 322 | Disposition: A | Discharge status: Home / Self Care | Attending: Cardiology | Admitting: Cardiology

## 2023-04-17 DIAGNOSIS — I429 Cardiomyopathy, unspecified: Secondary | ICD-10-CM | POA: Diagnosis present

## 2023-04-17 DIAGNOSIS — E876 Hypokalemia: Secondary | ICD-10-CM | POA: Diagnosis present

## 2023-04-17 DIAGNOSIS — I213 ST elevation (STEMI) myocardial infarction of unspecified site: Principal | ICD-10-CM

## 2023-04-17 DIAGNOSIS — E785 Hyperlipidemia, unspecified: Secondary | ICD-10-CM | POA: Diagnosis present

## 2023-04-17 DIAGNOSIS — I252 Old myocardial infarction: Secondary | ICD-10-CM | POA: Diagnosis not present

## 2023-04-17 DIAGNOSIS — I2510 Atherosclerotic heart disease of native coronary artery without angina pectoris: Secondary | ICD-10-CM | POA: Diagnosis present

## 2023-04-17 DIAGNOSIS — E119 Type 2 diabetes mellitus without complications: Secondary | ICD-10-CM | POA: Diagnosis present

## 2023-04-17 DIAGNOSIS — Z955 Presence of coronary angioplasty implant and graft: Secondary | ICD-10-CM

## 2023-04-17 DIAGNOSIS — R079 Chest pain, unspecified: Secondary | ICD-10-CM | POA: Diagnosis not present

## 2023-04-17 DIAGNOSIS — I10 Essential (primary) hypertension: Secondary | ICD-10-CM | POA: Diagnosis present

## 2023-04-17 DIAGNOSIS — Z7902 Long term (current) use of antithrombotics/antiplatelets: Secondary | ICD-10-CM | POA: Diagnosis not present

## 2023-04-17 DIAGNOSIS — D696 Thrombocytopenia, unspecified: Secondary | ICD-10-CM | POA: Diagnosis present

## 2023-04-17 DIAGNOSIS — R058 Other specified cough: Secondary | ICD-10-CM | POA: Diagnosis present

## 2023-04-17 DIAGNOSIS — T464X5A Adverse effect of angiotensin-converting-enzyme inhibitors, initial encounter: Secondary | ICD-10-CM | POA: Diagnosis present

## 2023-04-17 DIAGNOSIS — I2111 ST elevation (STEMI) myocardial infarction involving right coronary artery: Secondary | ICD-10-CM

## 2023-04-17 DIAGNOSIS — Z79899 Other long term (current) drug therapy: Secondary | ICD-10-CM | POA: Diagnosis not present

## 2023-04-17 DIAGNOSIS — Z7982 Long term (current) use of aspirin: Secondary | ICD-10-CM | POA: Diagnosis not present

## 2023-04-17 HISTORY — PX: CORONARY/GRAFT ACUTE MI REVASCULARIZATION: CATH118305

## 2023-04-17 HISTORY — PX: LEFT HEART CATH AND CORONARY ANGIOGRAPHY: CATH118249

## 2023-04-17 LAB — BASIC METABOLIC PANEL
Anion gap: 10 (ref 5–15)
Anion gap: 11 (ref 5–15)
BUN: 19 mg/dL (ref 8–23)
BUN: 20 mg/dL (ref 8–23)
CO2: 23 mmol/L (ref 22–32)
CO2: 26 mmol/L (ref 22–32)
Calcium: 8.9 mg/dL (ref 8.9–10.3)
Calcium: 9.9 mg/dL (ref 8.9–10.3)
Chloride: 105 mmol/L (ref 98–111)
Chloride: 108 mmol/L (ref 98–111)
Creatinine, Ser: 1 mg/dL (ref 0.61–1.24)
Creatinine, Ser: 1.05 mg/dL (ref 0.61–1.24)
GFR, Estimated: >60 mL/min (ref >60)
GFR, Estimated: >60 mL/min (ref >60)
Glucose, Bld: 123 mg/dL — ABNORMAL HIGH (ref 70–99)
Glucose, Bld: 141 mg/dL — ABNORMAL HIGH (ref 70–99)
Potassium: 3 mmol/L — ABNORMAL LOW (ref 3.5–5.1)
Potassium: 4.3 mmol/L (ref 3.5–5.1)
Sodium: 141 mmol/L (ref 135–145)
Sodium: 142 mmol/L (ref 135–145)

## 2023-04-17 LAB — CREATININE, SERUM
Creatinine, Ser: 1.04 mg/dL (ref 0.61–1.24)
GFR, Estimated: >60 mL/min (ref >60)

## 2023-04-17 LAB — CBC
HCT: 44.3 % (ref 39.0–52.0)
HCT: 46 % (ref 39.0–52.0)
Hemoglobin: 14.7 g/dL (ref 13.0–17.0)
Hemoglobin: 15.8 g/dL (ref 13.0–17.0)
MCH: 29.9 pg (ref 26.0–34.0)
MCH: 30.1 pg (ref 26.0–34.0)
MCHC: 33.2 g/dL (ref 30.0–36.0)
MCHC: 34.3 g/dL (ref 30.0–36.0)
MCV: 87.6 fL (ref 80.0–100.0)
MCV: 90 fL (ref 80.0–100.0)
Platelets: 106 10*3/uL — ABNORMAL LOW (ref 150–400)
Platelets: 130 10*3/uL — ABNORMAL LOW (ref 150–400)
RBC: 4.92 MIL/uL (ref 4.22–5.81)
RBC: 5.25 MIL/uL (ref 4.22–5.81)
RDW: 12.5 % (ref 11.5–15.5)
RDW: 12.6 % (ref 11.5–15.5)
WBC: 6.2 10*3/uL (ref 4.0–10.5)
WBC: 7.4 10*3/uL (ref 4.0–10.5)
nRBC: 0 % (ref 0.0–0.2)
nRBC: 0 % (ref 0.0–0.2)

## 2023-04-17 LAB — LIPID PANEL
Cholesterol: 107 mg/dL (ref 0–200)
HDL: 35 mg/dL — ABNORMAL LOW (ref >40)
LDL Cholesterol: 68 mg/dL (ref 0–99)
Total CHOL/HDL Ratio: 3.1 ratio
Triglycerides: 21 mg/dL (ref <150)
VLDL: 4 mg/dL (ref 0–40)

## 2023-04-17 LAB — TROPONIN I (HIGH SENSITIVITY)
Troponin I (High Sensitivity): 152 ng/L (ref <18)
Troponin I (High Sensitivity): 6 ng/L (ref <18)

## 2023-04-17 LAB — HIV ANTIBODY (ROUTINE TESTING W REFLEX): HIV Screen 4th Generation wRfx: NONREACTIVE

## 2023-04-17 LAB — HEMOGLOBIN A1C
Hgb A1c MFr Bld: 5.7 % — ABNORMAL HIGH (ref 4.8–5.6)
Mean Plasma Glucose: 116.89 mg/dL

## 2023-04-17 LAB — ECHOCARDIOGRAM COMPLETE
Area-P 1/2: 2.56 cm2
S' Lateral: 3.4 cm
Weight: 3744 [oz_av]

## 2023-04-17 LAB — MRSA NEXT GEN BY PCR, NASAL: MRSA by PCR Next Gen: NOT DETECTED

## 2023-04-17 SURGERY — LEFT HEART CATH AND CORONARY ANGIOGRAPHY
Anesthesia: LOCAL

## 2023-04-17 MED ORDER — PERFLUTREN LIPID MICROSPHERE
1.0000 mL | INTRAVENOUS | Status: AC | PRN
Start: 2023-04-17 — End: 2023-04-17
  Administered 2023-04-17: 4 mL via INTRAVENOUS

## 2023-04-17 MED ORDER — ATROPINE SULFATE 1 MG/10ML IJ SOSY
PREFILLED_SYRINGE | INTRAMUSCULAR | Status: AC
  Filled 2023-04-17: qty 10

## 2023-04-17 MED ORDER — PRASUGREL HCL 10 MG PO TABS
ORAL_TABLET | ORAL | Status: AC
  Filled 2023-04-17: qty 6

## 2023-04-17 MED ORDER — MORPHINE SULFATE (PF) 4 MG/ML IV SOLN
4.0000 mg | Freq: Once | INTRAVENOUS | Status: AC
  Administered 2023-04-17: 4 mg via INTRAVENOUS
  Filled 2023-04-17: qty 1

## 2023-04-17 MED ORDER — SODIUM CHLORIDE 0.9 % IV SOLN: 250.0000 mL | INTRAVENOUS | Status: AC | PRN

## 2023-04-17 MED ORDER — HEPARIN SODIUM (PORCINE) 5000 UNIT/ML IJ SOLN
4000.0000 [IU] | Freq: Once | INTRAMUSCULAR | Status: AC
  Administered 2023-04-17: 4000 [IU] via INTRAVENOUS
  Filled 2023-04-17: qty 1

## 2023-04-17 MED ORDER — SODIUM CHLORIDE 0.9% FLUSH: 3.0000 mL | INTRAVENOUS | Status: DC | PRN

## 2023-04-17 MED ORDER — HEPARIN SODIUM (PORCINE) 1000 UNIT/ML IJ SOLN
INTRAMUSCULAR | Status: DC | PRN
  Administered 2023-04-17: 8000 [IU] via INTRAVENOUS

## 2023-04-17 MED ORDER — HEPARIN (PORCINE) IN NACL 1000-0.9 UT/500ML-% IV SOLN
INTRAVENOUS | Status: DC | PRN
  Administered 2023-04-17 (×2): 500 mL

## 2023-04-17 MED ORDER — ATORVASTATIN CALCIUM 80 MG PO TABS
80.0000 mg | ORAL_TABLET | Freq: Every day | ORAL | Status: DC
  Administered 2023-04-17 – 2023-04-18 (×2): 80 mg via ORAL
  Filled 2023-04-17 (×2): qty 1

## 2023-04-17 MED ORDER — ASPIRIN 81 MG PO TBEC
81.0000 mg | DELAYED_RELEASE_TABLET | Freq: Every day | ORAL | Status: DC
  Administered 2023-04-18: 81 mg via ORAL
  Filled 2023-04-17: qty 1

## 2023-04-17 MED ORDER — EZETIMIBE 10 MG PO TABS
10.0000 mg | ORAL_TABLET | Freq: Every day | ORAL | Status: DC
  Administered 2023-04-17 – 2023-04-18 (×2): 10 mg via ORAL
  Filled 2023-04-17 (×2): qty 1

## 2023-04-17 MED ORDER — NITROGLYCERIN 0.4 MG SL SUBL: 0.4000 mg | SUBLINGUAL_TABLET | SUBLINGUAL | Status: DC | PRN

## 2023-04-17 MED ORDER — ASPIRIN 81 MG PO CHEW
CHEWABLE_TABLET | ORAL | Status: AC
  Filled 2023-04-17: qty 4

## 2023-04-17 MED ORDER — SODIUM CHLORIDE 0.9% FLUSH
3.0000 mL | Freq: Two times a day (BID) | INTRAVENOUS | Status: DC
  Administered 2023-04-17 – 2023-04-18 (×2): 3 mL via INTRAVENOUS

## 2023-04-17 MED ORDER — ENOXAPARIN SODIUM 40 MG/0.4ML IJ SOSY: 40.0000 mg | PREFILLED_SYRINGE | INTRAMUSCULAR | Status: DC

## 2023-04-17 MED ORDER — SODIUM CHLORIDE 0.9 % IV SOLN
INTRAVENOUS | Status: AC | PRN
  Administered 2023-04-17: 250 mL via INTRAVENOUS

## 2023-04-17 MED ORDER — ONDANSETRON HCL 4 MG/2ML IJ SOLN
4.0000 mg | Freq: Once | INTRAMUSCULAR | Status: AC
  Administered 2023-04-17: 4 mg via INTRAVENOUS
  Filled 2023-04-17: qty 2

## 2023-04-17 MED ORDER — ACETAMINOPHEN 325 MG PO TABS
650.0000 mg | ORAL_TABLET | ORAL | Status: DC | PRN
  Administered 2023-04-17: 650 mg via ORAL
  Filled 2023-04-17: qty 2

## 2023-04-17 MED ORDER — NOREPINEPHRINE 4 MG/250ML-% IV SOLN
INTRAVENOUS | Status: AC
  Filled 2023-04-17: qty 250

## 2023-04-17 MED ORDER — ONDANSETRON HCL 4 MG/2ML IJ SOLN: 4.0000 mg | Freq: Four times a day (QID) | INTRAMUSCULAR | Status: DC | PRN

## 2023-04-17 MED ORDER — LIDOCAINE HCL (PF) 1 % IJ SOLN
INTRAMUSCULAR | Status: DC | PRN
  Administered 2023-04-17: 2 mL via INTRADERMAL

## 2023-04-17 MED ORDER — ASPIRIN 81 MG PO CHEW
324.0000 mg | CHEWABLE_TABLET | Freq: Once | ORAL | Status: AC
  Administered 2023-04-17: 324 mg via ORAL
  Filled 2023-04-17: qty 4

## 2023-04-17 MED ORDER — NOREPINEPHRINE BITARTRATE 1 MG/ML IV SOLN
INTRAVENOUS | Status: DC | PRN
  Administered 2023-04-17: 10 ug/min via INTRAVENOUS

## 2023-04-17 MED ORDER — ATROPINE SULFATE 1 MG/10ML IJ SOSY
PREFILLED_SYRINGE | INTRAMUSCULAR | Status: DC | PRN
  Administered 2023-04-17: 1 mg via INTRAVENOUS

## 2023-04-17 MED ORDER — ENOXAPARIN SODIUM 40 MG/0.4ML IJ SOSY
40.0000 mg | PREFILLED_SYRINGE | INTRAMUSCULAR | Status: DC
  Administered 2023-04-17: 40 mg via SUBCUTANEOUS
  Filled 2023-04-17: qty 0.4

## 2023-04-17 MED ORDER — ENOXAPARIN SODIUM 40 MG/0.4ML IJ SOSY
40.0000 mg | PREFILLED_SYRINGE | INTRAMUSCULAR | Status: DC
Start: 2023-04-17 — End: 2023-04-17

## 2023-04-17 MED ORDER — NITROGLYCERIN 1 MG/10 ML FOR IR/CATH LAB
INTRA_ARTERIAL | Status: DC | PRN
  Administered 2023-04-17: 200 ug via INTRACORONARY

## 2023-04-17 MED ORDER — VERAPAMIL HCL 2.5 MG/ML IV SOLN
INTRAVENOUS | Status: DC | PRN
  Administered 2023-04-17: 10 mL via INTRA_ARTERIAL

## 2023-04-17 MED ORDER — IOHEXOL 350 MG/ML SOLN
INTRAVENOUS | Status: DC | PRN
  Administered 2023-04-17: 90 mL

## 2023-04-17 MED ORDER — PRASUGREL HCL 10 MG PO TABS
ORAL_TABLET | ORAL | Status: AC
  Filled 2023-04-17: qty 1

## 2023-04-17 MED ORDER — PRASUGREL HCL 10 MG PO TABS
10.0000 mg | ORAL_TABLET | Freq: Every day | ORAL | Status: DC
  Administered 2023-04-18: 10 mg via ORAL
  Filled 2023-04-17: qty 1

## 2023-04-17 MED ORDER — PRASUGREL HCL 10 MG PO TABS
ORAL_TABLET | ORAL | Status: DC | PRN
  Administered 2023-04-17: 60 mg via ORAL

## 2023-04-17 SURGICAL SUPPLY — 19 items
BAG SNAP BAND KOVER 36X36 (MISCELLANEOUS) IMPLANT
BALL SAPPHIRE NC24 3.25X15 (BALLOONS) ×1 IMPLANT
BALLN EMERGE MR 2.5X12 (BALLOONS) ×1 IMPLANT
BALLOON EMERGE MR 2.5X12 (BALLOONS) IMPLANT
BALLOON SAPPHIRE NC24 3.25X15 (BALLOONS) IMPLANT
CATH 5FR JL3.5 JR4 ANG PIG MP (CATHETERS) IMPLANT
CATH VISTA GUIDE 6FR JR4 ECOPK (CATHETERS) IMPLANT
DEVICE RAD COMP TR BAND LRG (VASCULAR PRODUCTS) IMPLANT
GLIDESHEATH SLEND SS 6F .021 (SHEATH) IMPLANT
GUIDEWIRE INQWIRE 1.5J.035X260 (WIRE) IMPLANT
INQWIRE 1.5J .035X260CM (WIRE) ×1 IMPLANT
KIT ENCORE 26 ADVANTAGE (KITS) IMPLANT
PACK CARDIAC CATHETERIZATION (CUSTOM PROCEDURE TRAY) ×1 IMPLANT
SET ATX-X65L (MISCELLANEOUS) IMPLANT
SHEATH GLIDE SLENDER 4/5FR (SHEATH) IMPLANT
SHEATH PROBE COVER 6X72 (BAG) IMPLANT
STENT SYNERGY XD 3.0X32 (Permanent Stent) IMPLANT
TUBING CIL FLEX 10 FLL-RA (TUBING) IMPLANT
WIRE ASAHI PROWATER 180CM (WIRE) IMPLANT

## 2023-04-17 NOTE — ED Notes (Signed)
 Pt BIB [facility] from [facility], for CODE STEMI, Dr. Swaziland with cardiologist at bedside, cath lab being prepped at this time  Arrived at [facility] around 11:59 for CP that started 2 hrs PTA. Felt like the MI he had several years ago, has not f/u with cards.   Meds PTA 4 mg morphine 4 zofran 324 mg ASA 4000 units heparin bolus 1L NS bolus    EMS vitals 71 HR 110/54 & 109/55 96% RA

## 2023-04-17 NOTE — ED Notes (Signed)
 Pt to cath lab at this time with this RN and Dr. Swaziland, pt is on cardiac monitor and Zoll pacer pads

## 2023-04-17 NOTE — ED Notes (Signed)
 Report given to Zach at [facility] at this time

## 2023-04-17 NOTE — Plan of Care (Signed)

## 2023-04-17 NOTE — ED Provider Notes (Signed)
 [place] EMERGENCY DEPARTMENT AT [facility] Provider Note   CSN: [id number] Arrival date & time: 04/16/23  2356     History  Chief Complaint  Patient presents with   Code STEMI    Mark Levine is a 69 y.o. male.  Patient presents to the department for evaluation of chest pain.  Patient reports sudden onset of chest pain around 10 PM.  He reports that he was getting ready for bed when the pain began.  He feels a little short of breath and was initially diaphoretic.  Patient reports heart attack 5 years ago with stenting and these symptoms feel similar.       Home Medications Prior to Admission medications   Medication Sig Start Date End Date Taking? Authorizing Provider  acetaminophen-codeine (TYLENOL #3) 300-30 MG per tablet Take 1 tablet by mouth every 4 (four) hours as needed for moderate pain.    [provider]  aspirin EC 81 MG EC tablet Take 1 tablet (81 mg total) by mouth daily. 04/05/13   Robbie Lis M, PA-C  atorvastatin (LIPITOR) 40 MG tablet Take 1 tablet (40 mg total) by mouth daily. 04/15/23   Kathleene Hazel, MD  metoprolol tartrate (LOPRESSOR) 25 MG tablet Take 1 tablet (25 mg total) by mouth 2 (two) times daily. 04/16/23   Kathleene Hazel, MD  nitroGLYCERIN (NITROSTAT) 0.4 MG SL tablet Place 1 tablet (0.4 mg total) under the tongue every 5 (five) minutes x 3 doses as needed for chest pain. 02/06/15   Tereso Newcomer T, PA-C  traZODone (DESYREL) 50 MG tablet Take 1 tablet by mouth as needed. 12/30/20   [provider]  VIAGRA 100 MG tablet Take 100 mg by mouth daily as needed for erectile dysfunction.  04/27/14   [provider]      Allergies    Other and Shellfish allergy    Review of Systems   Review of Systems  Physical Exam Updated Vital Signs BP 119/74   Pulse 71   Resp (!) 27   SpO2 100%  Physical Exam Vitals and nursing note reviewed.  Constitutional:      General: He is not in acute  distress.    Appearance: He is well-developed.  HENT:     Head: Normocephalic and atraumatic.     Mouth/Throat:     Mouth: Mucous membranes are moist.  Eyes:     General: Vision grossly intact. Gaze aligned appropriately.     Extraocular Movements: Extraocular movements intact.     Conjunctiva/sclera: Conjunctivae normal.  Cardiovascular:     Rate and Rhythm: Normal rate and regular rhythm.     Pulses: Normal pulses.     Heart sounds: Normal heart sounds, S1 normal and S2 normal. No murmur heard.    No friction rub. No gallop.  Pulmonary:     Effort: Pulmonary effort is normal. No respiratory distress.     Breath sounds: Normal breath sounds.  Abdominal:     Palpations: Abdomen is soft.     Tenderness: There is no abdominal tenderness. There is no guarding or rebound.     Hernia: No hernia is present.  Musculoskeletal:        General: No swelling.     Cervical back: Full passive range of motion without pain, normal range of motion and neck supple. No pain with movement, spinous process tenderness or muscular tenderness. Normal range of motion.     Right lower leg: No edema.  Left lower leg: No edema.  Skin:    General: Skin is warm and dry.     Capillary Refill: Capillary refill takes less than 2 seconds.     Findings: No ecchymosis, erythema, lesion or wound.  Neurological:     Mental Status: He is alert and oriented to person, place, and time.     GCS: GCS eye subscore is 4. GCS verbal subscore is 5. GCS motor subscore is 6.     Cranial Nerves: Cranial nerves 2-12 are intact.     Sensory: Sensation is intact.     Motor: Motor function is intact. No weakness or abnormal muscle tone.     Coordination: Coordination is intact.  Psychiatric:        Mood and Affect: Mood normal.        Speech: Speech normal.        Behavior: Behavior normal.     ED Results / Procedures / Treatments   Labs (all labs ordered are listed, but only abnormal results are displayed) Labs  Reviewed  BASIC METABOLIC PANEL  CBC  TROPONIN I (HIGH SENSITIVITY)    EKG EKG Interpretation Date/Time:  Saturday April 17 2023 00:16:53 EDT Ventricular Rate:  66 PR Interval:  189 QRS Duration:  109 QT Interval:  392 QTC Calculation: 411 R Axis:   69  Text Interpretation: Sinus rhythm Borderline repolarization abnormality Confirmed by Gilda Crease [phone]) on 04/17/2023 12:25:01 AM  Radiology No results found.  Procedures Procedures    Medications Ordered in ED Medications  morphine (PF) 4 MG/ML injection 4 mg (has no administration in time range)  ondansetron (ZOFRAN) injection 4 mg (has no administration in time range)  heparin injection 4,000 Units (has no administration in time range)  aspirin 81 MG chewable tablet (has no administration in time range)  aspirin chewable tablet 324 mg (324 mg Oral Given 04/17/23 0022)    ED Course/ Medical Decision Making/ A&P                                 Medical Decision Making Amount and/or Complexity of Data Reviewed Labs: ordered. Radiology: ordered.   Differential Diagnosis considered includes, but not limited to: STEMI; NSTEMI; myocarditis; pericarditis; pulmonary embolism; aortic dissection; pneumothorax; pneumonia; gastritis; musculoskeletal pain  To the emergency department for evaluation of acute onset of chest pain.  Patient with prior history of MI and has had prior stenting.  Patient reports pain is similar to his previous MI.  EKG at arrival with ST elevations in inferior and lateral leads, some high reciprocal changes.  Code STEMI initiated.  Discussed with Dr. Sw[place], on-call for interventional cardiology.  Patient will be transferred emergently to [facility] for cardiac catheterization.  CRITICAL CARE Performed by: Gilda Crease   Total critical care time: 32 minutes  Critical care time was exclusive of separately billable procedures and treating other patients.  Critical care was  necessary to treat or prevent imminent or life-threatening deterioration.  Critical care was time spent personally by me on the following activities: development of treatment plan with patient and/or surrogate as well as nursing, discussions with consultants, evaluation of patient's response to treatment, examination of patient, obtaining history from patient or surrogate, ordering and performing treatments and interventions, ordering and review of laboratory studies, ordering and review of radiographic studies, pulse oximetry and re-evaluation of patient's condition.         Final Clinical  Impression(s) / ED Diagnoses Final diagnoses:  ST elevation myocardial infarction (STEMI), unspecified artery [facility])    Rx / DC Orders ED Discharge Orders     None         Michale Emmerich, Canary Brim, MD 04/17/23 [phone]

## 2023-04-17 NOTE — ED Notes (Signed)
 1L NS hung 2115 per [facility] EDP.

## 2023-04-17 NOTE — Progress Notes (Signed)
   04/17/23 0117  Spiritual Encounters  Type of Visit Initial  Care provided to: Family  Reason for visit Code  OnCall Visit Yes   Chaplain responded to Code Stemi. Pt taken to Cath Lab. Chaplain met with family in waiting room of 2H. No additional spiritual need at this time.  Chaplain Daron Offer

## 2023-04-17 NOTE — Progress Notes (Signed)
 [facility] Physician-Brief Progress Note Patient Name: Mark Levine DOB: 09-Mar-1955 MRN: [id number]   Date of Service  04/17/2023  HPI/Events of Note  68 y.o. male with history of CAD s/p inferior MI and metabolic syndrome in 2015  who is being seen 04/17/2023 for the evaluation of acute chest pain and inferior STEMI.  Patient found to have minimal tachypnea and mild hypertension saturating 94% on 2 L of oxygen.  Currently requiring low-dose norepinephrine.  Results consistent with hypokalemia and thrombocytopenia.  Radiograph with no acute findings.  ST changes improved on latest EKG.  Single-vessel CAD to the RCA, DES placed.   eICU Interventions  Dual antiplatelet therapy with aspirin and prasugrel  Restratification for diabetes and dyslipidemia, statin as needed  DVT prophylaxis currently held, consider initiation of heparin SQ GI prophylaxis not indicated     Intervention Category Evaluation Type: New Patient Evaluation  Adilee Lemme 04/17/2023, 2:17 AM

## 2023-04-17 NOTE — H&P (Signed)
 Cardiology Admission History and Physical   Patient ID: Mark Levine MRN: [id number]; DOB: 02-05-1955   Admission date: 04/17/2023  PCP:  Pearson Grippe, MD   [facility] Providers Cardiologist:  Verne Carrow, MD        Chief Complaint:  chest pain  Patient Profile:   Mark Levine is a 68 y.o. male with history of CAD s/p MI in 2015  who is being seen 04/17/2023 for the evaluation of acute chest pain and inferior STEMI.  History of Present Illness:   Mark Levine is transferred from [facility] for acute inferior STEMI. Pain started at 10 pm. Sudden. Some diaphoresis. Pain 10/10. Drove to ED. Ecg there shows new ST elevation inferiorly. Code STEMI activated. He is s/p inferior STEMI in 2015 with Vfib x 2 and subsequent stenting of RCA with 3.0 x 16 mm Promus stent. He has HTN and HLD.    Past Medical History:  Diagnosis Date   CAD (coronary artery disease)    a. Inf STEMI5/15 >> LHC (04/02/13): ostial left main 20-25, apical LAD 70, mid to distal RCA occluded, EF 50% inferior HK. >> PCI:  Promus premier (16 x 3 mm) DES to the RCA.    CAD (coronary artery disease), native coronary artery 05/10/2014   DES RCA during inferior STEMI    Cough due to ACE inhibitor 05/11/2014   Lisinopril discontinued 05/11/14    Erectile dysfunction 10/11/2013   Essential hypertension 05/10/2014   History of nuclear stress test    a.Myoview 1/17: EF 48%, apical and apical lateral scar, no ischemia; Intermediate Risk (low EF)   Hyperlipidemia    MI, 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   Inferior STEMI  04/02/13     Past Surgical History:  Procedure Laterality Date   LEFT HEART CATHETERIZATION WITH CORONARY ANGIOGRAM N/A 04/02/2013   Procedure: LEFT HEART CATHETERIZATION WITH CORONARY ANGIOGRAM;  Surgeon: Lesleigh Noe, MD;  Location: [facility] CATH LAB;  Service: Cardiovascular;  Laterality: N/A;   NO PAST SURGERIES       Medications Prior to Admission: Prior to Admission medications   Medication Sig  Start Date End Date Taking? Authorizing Provider  acetaminophen-codeine (TYLENOL #3) 300-30 MG per tablet Take 1 tablet by mouth every 4 (four) hours as needed for moderate pain.    [provider]  aspirin EC 81 MG EC tablet Take 1 tablet (81 mg total) by mouth daily. 04/05/13   Robbie Lis M, PA-C  atorvastatin (LIPITOR) 40 MG tablet Take 1 tablet (40 mg total) by mouth daily. 04/15/23   Kathleene Hazel, MD  metoprolol tartrate (LOPRESSOR) 25 MG tablet Take 1 tablet (25 mg total) by mouth 2 (two) times daily. 04/16/23   Kathleene Hazel, MD  nitroGLYCERIN (NITROSTAT) 0.4 MG SL tablet Place 1 tablet (0.4 mg total) under the tongue every 5 (five) minutes x 3 doses as needed for chest pain. 02/06/15   Tereso Newcomer T, PA-C  traZODone (DESYREL) 50 MG tablet Take 1 tablet by mouth as needed. 12/30/20   [provider]  VIAGRA 100 MG tablet Take 100 mg by mouth daily as needed for erectile dysfunction.  04/27/14   [provider]     Allergies:    Allergies  Allergen Reactions   Other Swelling    All nuts " throw swelling"   Shellfish Allergy Hives    Social History:   Social History   Socioeconomic History   Marital status: Married    Spouse  name: Not on file   Number of children: Not on file   Years of education: Not on file   Highest education level: Not on file  Occupational History   Not on file  Tobacco Use   Smoking status: Never   Smokeless tobacco: Never  Vaping Use   Vaping status: Never Used  Substance and Sexual Activity   Alcohol use: No    Alcohol/week: 0.0 standard drinks of alcohol   Drug use: No   Sexual activity: Not on file  Other Topics Concern   Not on file  Social History Narrative   Not on file   Social Drivers of Health   Financial Resource Strain: Not on file  Food Insecurity: Not on file  Transportation Needs: Not on file  Physical Activity: Not on file  Stress: Not on file  Social Connections: Not on  file  Intimate Partner Violence: Not on file    Family History:   The patient's family history includes Other in his father.    ROS:  Please see the history of present illness.  All other ROS reviewed and negative.     Physical Exam/Data:   Vitals:   04/17/23 0015 04/17/23 0028 04/17/23 0030 04/17/23 0108  BP: 119/74  110/62   Pulse: 71  76   Resp: (!) 27  20   Temp:  97.7 F (36.5 C)    SpO2: 100%  98% 100%  Weight:  106.1 kg     No intake or output data in the 24 hours ending 04/17/23 0111    04/17/2023   12:28 AM 02/26/2023    8:13 AM 01/05/2022    2:04 PM  Last 3 Weights  Weight (lbs) 234 lb 233 lb 225 lb 12.8 oz  Weight (kg) 106.142 kg 105.688 kg 102.422 kg     Body mass index is 31.74 kg/m.  General:  Well nourished, well developed, in no acute distress HEENT: normal Neck: no JVD Vascular: No carotid bruits; Distal pulses 2+ bilaterally   Cardiac:  normal S1, S2; RRR; no murmur  Lungs:  clear to auscultation bilaterally, no wheezing, rhonchi or rales  Abd: soft, nontender, no hepatomegaly  Ext: no edema Musculoskeletal:  No deformities, BUE and BLE strength normal and equal Skin: warm and dry  Neuro:  CNs 2-12 intact, no focal abnormalities noted Psych:  Normal affect    EKG:  The ECG that was done today was personally reviewed and demonstrates NSR with ST elevation in inferior leads 2 mm  Relevant CV Studies:   Laboratory Data:  High Sensitivity Troponin:   Recent Labs  Lab 04/17/23 0014  TROPONINIHS 6      Chemistry Recent Labs  Lab 04/17/23 0014  NA 142  K 3.0*  CL 105  CO2 26  GLUCOSE 123*  BUN 20  CREATININE 1.05  CALCIUM 9.9  GFRNONAA >60  ANIONGAP 11    No results for input(s): "PROT", "ALBUMIN", "AST", "ALT", "ALKPHOS", "BILITOT" in the last 168 hours. Lipids No results for input(s): "CHOL", "TRIG", "HDL", "LABVLDL", "LDLCALC", "CHOLHDL" in the last 168 hours. Hematology Recent Labs  Lab 04/17/23 0014  WBC 7.4  RBC 5.25   HGB 15.8  HCT 46.0  MCV 87.6  MCH 30.1  MCHC 34.3  RDW 12.6  PLT 130*   Thyroid No results for input(s): "TSH", "FREET4" in the last 168 hours. BNPNo results for input(s): "BNP", "PROBNP" in the last 168 hours.  DDimer No results for input(s): "DDIMER" in the last  168 hours.   Radiology/Studies:  DG Chest Port 1 View Result Date: 04/17/2023 CLINICAL DATA:  Chest pain EXAM: PORTABLE CHEST 1 VIEW COMPARISON:  04/03/2013 FINDINGS: Heart and mediastinal contours are within normal limits. No focal opacities or effusions. No acute bony abnormality. IMPRESSION: No active disease. Electronically Signed   By: Charlett Nose M.D.   On: 04/17/2023 00:34     Assessment and Plan:   Acute Inferior STEMI. Known CAD with prior STEMI in 2015. S/p stent of RCA. Will proceed with direct cardiac cath and PCI.  HTN HLD- high dose statin   Risk Assessment/Risk Scores:    TIMI Risk Score for ST  Elevation MI:   The patient's TIMI risk score is 2, which indicates a 2.2% risk of all cause mortality at 30 days.       Code Status: Full Code  Severity of Illness: The appropriate patient status for this patient is INPATIENT. Inpatient status is judged to be reasonable and necessary in order to provide the required intensity of service to ensure the patient's safety. The patient's presenting symptoms, physical exam findings, and initial radiographic and laboratory data in the context of their chronic comorbidities is felt to place them at high risk for further clinical deterioration. Furthermore, it is not anticipated that the patient will be medically stable for discharge from the hospital within 2 midnights of admission.   * I certify that at the point of admission it is my clinical judgment that the patient will require inpatient hospital care spanning beyond 2 midnights from the point of admission due to high intensity of service, high risk for further deterioration and high frequency of surveillance  required.*   For questions or updates, please contact [facility] Please consult [email or web address] for contact info under     Signed, Jazmaine Fuelling [place], MD  04/17/2023 1:11 AM

## 2023-04-17 NOTE — Plan of Care (Signed)
  Problem: Education: Goal: Knowledge of General Education information will improve Description: Including pain rating scale, medication(s)/side effects and non-pharmacologic comfort measures Outcome: Progressing   Problem: Health Behavior/Discharge Planning: Goal: Ability to manage health-related needs will improve Outcome: Progressing   Problem: Clinical Measurements: Goal: Ability to maintain clinical measurements within normal limits will improve Outcome: Progressing Goal: Will remain free from infection Outcome: Progressing Goal: Diagnostic test results will improve Outcome: Progressing Goal: Respiratory complications will improve Outcome: Progressing Goal: Cardiovascular complication will be avoided Outcome: Progressing   Problem: Activity: Goal: Risk for activity intolerance will decrease Outcome: Progressing   Problem: Nutrition: Goal: Adequate nutrition will be maintained Outcome: Progressing   Problem: Coping: Goal: Level of anxiety will decrease Outcome: Progressing   Problem: Elimination: Goal: Will not experience complications related to bowel motility Outcome: Progressing Goal: Will not experience complications related to urinary retention Outcome: Progressing   Problem: Pain Managment: Goal: General experience of comfort will improve and/or be controlled Outcome: Progressing   Problem: Safety: Goal: Ability to remain free from injury will improve Outcome: Progressing   Problem: Skin Integrity: Goal: Risk for impaired skin integrity will decrease Outcome: Progressing   Problem: Education: Goal: Understanding of cardiac disease, CV risk reduction, and recovery process will improve Outcome: Progressing   Problem: Activity: Goal: Ability to tolerate increased activity will improve Outcome: Progressing   Problem: Cardiac: Goal: Ability to achieve and maintain adequate cardiovascular perfusion will improve Outcome: Progressing   Problem: Health  Behavior/Discharge Planning: Goal: Ability to safely manage health-related needs after discharge will improve Outcome: Progressing   Problem: Education: Goal: Understanding of CV disease, CV risk reduction, and recovery process will improve Outcome: Progressing   Problem: Activity: Goal: Ability to return to baseline activity level will improve Outcome: Progressing   Problem: Cardiovascular: Goal: Ability to achieve and maintain adequate cardiovascular perfusion will improve Outcome: Progressing Goal: Vascular access site(s) Level 0-1 will be maintained Outcome: Progressing   Problem: Health Behavior/Discharge Planning: Goal: Ability to safely manage health-related needs after discharge will improve Outcome: Progressing

## 2023-04-17 NOTE — ED Provider Notes (Signed)
 Mark Levine presents from [facility] as a code STEMI.  Dr. [place] is at the bedside.  Having ongoing chest discomfort.  He is awake, alert, oriented.  Nontoxic-appearing.  Has already received heparin and aspirin.  Appropriate pads were placed for Cath Lab.  Patient transferred emergently to the Cath Lab per Dr. [place].   Shon Baton, MD 04/17/23 [phone]

## 2023-04-17 NOTE — Progress Notes (Signed)
 Echocardiogram 2D Echocardiogram has been performed.  Warren Lacy Rulon Abdalla RDCS 04/17/2023, 10:45 AM

## 2023-04-17 NOTE — Progress Notes (Addendum)
 CARDIAC REHAB PHASE I  Pt on bed rest. Ed given to pt. Discussed restrictions, MI booklet, NTG use, ASA and Effient, stent, site care, heart healthy diet, and exercise guidelines. Pt left in bed w/ call bell in reach. Will refer to [facility] [place]. Will continue to follow.  [phone] Joya San, MS, ACSM-CEP 04/17/2023 10:00 AM

## 2023-04-18 ENCOUNTER — Telehealth: Payer: Self-pay | Admitting: Physician Assistant

## 2023-04-18 DIAGNOSIS — I2111 ST elevation (STEMI) myocardial infarction involving right coronary artery: Secondary | ICD-10-CM | POA: Diagnosis not present

## 2023-04-18 LAB — BASIC METABOLIC PANEL
Anion gap: 7 (ref 5–15)
BUN: 16 mg/dL (ref 8–23)
CO2: 25 mmol/L (ref 22–32)
Calcium: 8.4 mg/dL — ABNORMAL LOW (ref 8.9–10.3)
Chloride: 107 mmol/L (ref 98–111)
Creatinine, Ser: 1.11 mg/dL (ref 0.61–1.24)
GFR, Estimated: >60 mL/min (ref >60)
Glucose, Bld: 102 mg/dL — ABNORMAL HIGH (ref 70–99)
Potassium: 3.7 mmol/L (ref 3.5–5.1)
Sodium: 139 mmol/L (ref 135–145)

## 2023-04-18 LAB — CBC
HCT: 43.7 % (ref 39.0–52.0)
Hemoglobin: 15 g/dL (ref 13.0–17.0)
MCH: 30.2 pg (ref 26.0–34.0)
MCHC: 34.3 g/dL (ref 30.0–36.0)
MCV: 87.9 fL (ref 80.0–100.0)
Platelets: 119 10*3/uL — ABNORMAL LOW (ref 150–400)
RBC: 4.97 MIL/uL (ref 4.22–5.81)
RDW: 12.6 % (ref 11.5–15.5)
WBC: 6.9 10*3/uL (ref 4.0–10.5)
nRBC: 0 % (ref 0.0–0.2)

## 2023-04-18 LAB — MAGNESIUM: Magnesium: 1.8 mg/dL (ref 1.7–2.4)

## 2023-04-18 MED ORDER — METOPROLOL SUCCINATE ER 25 MG PO TB24
25.0000 mg | ORAL_TABLET | Freq: Every day | ORAL | Status: DC
  Administered 2023-04-18: 25 mg via ORAL
  Filled 2023-04-18: qty 1

## 2023-04-18 MED ORDER — PRASUGREL HCL 10 MG PO TABS: 10.0000 mg | ORAL_TABLET | Freq: Every day | ORAL | 3 refills | Status: AC

## 2023-04-18 MED ORDER — EZETIMIBE 10 MG PO TABS: 10.0000 mg | ORAL_TABLET | Freq: Every day | ORAL | 3 refills | Status: AC

## 2023-04-18 MED ORDER — METOPROLOL SUCCINATE ER 25 MG PO TB24: 25.0000 mg | ORAL_TABLET | Freq: Every day | ORAL | 3 refills | Status: AC

## 2023-04-18 MED ORDER — ATORVASTATIN CALCIUM 80 MG PO TABS: 80.0000 mg | ORAL_TABLET | Freq: Every day | ORAL | 3 refills | Status: AC

## 2023-04-18 MED ORDER — VIAGRA 100 MG PO TABS: 100.0000 mg | ORAL_TABLET | Freq: Every day | ORAL | 1 refills | Status: AC | PRN

## 2023-04-18 MED ORDER — MAGNESIUM SULFATE 2 GM/50ML IV SOLN: 2.0000 g | Freq: Once | INTRAVENOUS | Status: DC

## 2023-04-18 MED ORDER — NITROGLYCERIN 0.4 MG SL SUBL: 0.4000 mg | SUBLINGUAL_TABLET | SUBLINGUAL | 3 refills | Status: AC | PRN

## 2023-04-18 MED ORDER — LOSARTAN POTASSIUM 50 MG PO TABS
50.0000 mg | ORAL_TABLET | Freq: Every day | ORAL | Status: DC
  Administered 2023-04-18: 50 mg via ORAL
  Filled 2023-04-18: qty 1

## 2023-04-18 NOTE — Progress Notes (Addendum)
 Patient ID: Mark Levine, male   DOB: Jun 19, 1955, 68 y.o.   MRN: [id number]     Advanced Heart Failure Rounding Note  Cardiologist: Verne Carrow, MD  Chief Complaint: inferior MI Subjective:    No complaints this morning.  No further chest pain.  BP mildly elevated, HR stable.    Echo: EF 50-55%, normal RV, mild-moderate MR.    Objective:   Weight Range: 106.1 kg Body mass index is 31.74 kg/m.   Vital Signs:   Temp:  [98.1 F (36.7 C)-98.8 F (37.1 C)] 98.4 F (36.9 C) (03/16 0759) Pulse Rate:  [57-90] 73 (03/16 0900) Resp:  [8-30] 20 (03/16 0900) BP: (97-162)/(63-98) 140/91 (03/16 0900) SpO2:  [91 %-100 %] 96 % (03/16 0900) Last BM Date : 04/16/23  Weight change: Filed Weights   04/17/23 0028  Weight: 106.1 kg    Intake/Output:   Intake/Output Summary (Last 24 hours) at 04/18/2023 0930 Last data filed at 04/18/2023 0926 Gross per 24 hour  Intake 376.26 ml  Output 1970 ml  Net -1593.74 ml      Physical Exam    General:  Well appearing. No resp difficulty HEENT: Normal Neck: Supple. JVP . Carotids 2+ bilat; no bruits. No lymphadenopathy or thyromegaly appreciated. Cor: PMI nondisplaced. Regular rate & rhythm. No rubs, gallops or murmurs. Lungs: Clear Abdomen: Soft, nontender, nondistended. No hepatosplenomegaly. No bruits or masses. Good bowel sounds. Extremities: No cyanosis, clubbing, rash, edema Neuro: Alert & orientedx3, cranial nerves grossly intact. moves all 4 extremities w/o difficulty. Affect pleasant   Telemetry   NSR 70s (personally reviewed)  EKG    NSR, inferior TWIs (personally reviewed)  Labs    CBC Recent Labs    04/17/23 0242 04/18/23 0226  WBC 6.2 6.9  HGB 14.7 15.0  HCT 44.3 43.7  MCV 90.0 87.9  PLT 106* 119*   Basic Metabolic Panel Recent Labs    86/57/84 0242 04/18/23 0226  NA 141 139  K 4.3 3.7  CL 108 107  CO2 23 25  GLUCOSE 141* 102*  BUN 19 16  CREATININE 1.04  1.00 1.11  CALCIUM 8.9 8.4*  MG   --  1.8   Liver Function Tests No results for input(s): "AST", "ALT", "ALKPHOS", "BILITOT", "PROT", "ALBUMIN" in the last 72 hours. No results for input(s): "LIPASE", "AMYLASE" in the last 72 hours. Cardiac Enzymes No results for input(s): "CKTOTAL", "CKMB", "CKMBINDEX", "TROPONINI" in the last 72 hours.  BNP: BNP (last 3 results) No results for input(s): "BNP" in the last 8760 hours.  ProBNP (last 3 results) No results for input(s): "PROBNP" in the last 8760 hours.   D-Dimer No results for input(s): "DDIMER" in the last 72 hours. Hemoglobin A1C Recent Labs    04/17/23 0242  HGBA1C 5.7*   Fasting Lipid Panel Recent Labs    04/17/23 0242  CHOL 107  HDL 35*  LDLCALC 68  TRIG 21  CHOLHDL 3.1   Thyroid Function Tests No results for input(s): "TSH", "T4TOTAL", "T3FREE", "THYROIDAB" in the last 72 hours.  Invalid input(s): "FREET3"  Other results:   Imaging    ECHOCARDIOGRAM COMPLETE Result Date: 04/17/2023    ECHOCARDIOGRAM REPORT   Patient Name:   KAREN KINNARD [facility] Date of Exam: 04/17/2023 Medical Rec #:  [id number]       Height:       72.0 in Accession #:    [id number]      Weight:       234.0 lb Date of Birth:  28-Mar-1955       BSA:          2.278 m Patient Age:    67 years        BP:           128/75 mmHg Patient Gender: M               HR:           54 bpm. Exam Location:  Inpatient Procedure: 2D Echo, Color Doppler, Cardiac Doppler and Intracardiac            Opacification Agent (Both Spectral and Color Flow Doppler were            utilized during procedure). Indications:    Acute MI i21.9  History:        Patient has prior history of Echocardiogram examinations, most                 recent 03/16/2019. CAD; Risk Factors:Hypertension and                 Dyslipidemia.  Sonographer:    Irving Burton Senior RDCS Referring Phys: [phone] PETER M [place] IMPRESSIONS  1. Left ventricular ejection fraction, by estimation, is 50 to 55%. The left ventricle has low normal function. The left  ventricle demonstrates regional wall motion abnormalities (see scoring diagram/findings for description). Left ventricular diastolic  parameters are indeterminate.  2. Right ventricular systolic function is normal. The right ventricular size is moderately enlarged. Tricuspid regurgitation signal is inadequate for assessing PA pressure.  3. The mitral valve is abnormal. Mild to moderate mitral valve regurgitation.  4. The aortic valve is tricuspid. Aortic valve regurgitation is not visualized. Aortic valve sclerosis is present, with no evidence of aortic valve stenosis.  5. The inferior vena cava is normal in size with greater than 50% respiratory variability, suggesting right atrial pressure of 3 mmHg. FINDINGS  Left Ventricle: Left ventricular ejection fraction, by estimation, is 50 to 55%. The left ventricle has low normal function. The left ventricle demonstrates regional wall motion abnormalities. Definity contrast agent was given IV to delineate the left ventricular endocardial borders. The left ventricular internal cavity size was normal in size. There is no left ventricular hypertrophy. Left ventricular diastolic parameters are indeterminate.  LV Wall Scoring: The basal inferior segment is hypokinetic. Right Ventricle: The right ventricular size is moderately enlarged. No increase in right ventricular wall thickness. Right ventricular systolic function is normal. Tricuspid regurgitation signal is inadequate for assessing PA pressure. Left Atrium: Left atrial size was normal in size. Right Atrium: Right atrial size was normal in size. Pericardium: Trivial pericardial effusion is present. Mitral Valve: The mitral valve is abnormal. Mild to moderate mitral valve regurgitation. Tricuspid Valve: The tricuspid valve is normal in structure. Tricuspid valve regurgitation is trivial. Aortic Valve: The aortic valve is tricuspid. Aortic valve regurgitation is not visualized. Aortic valve sclerosis is present, with no  evidence of aortic valve stenosis. Pulmonic Valve: The pulmonic valve was normal in structure. Pulmonic valve regurgitation is mild. Aorta: The aortic root and ascending aorta are structurally normal, with no evidence of dilitation. Venous: The inferior vena cava is normal in size with greater than 50% respiratory variability, suggesting right atrial pressure of 3 mmHg. IAS/Shunts: No atrial level shunt detected by color flow Doppler.  LEFT VENTRICLE PLAX 2D LVIDd:         5.40 cm   Diastology LVIDs:         3.40 cm  LV e' medial:    9.03 cm/s LV PW:         1.00 cm   LV E/e' medial:  9.6 LV IVS:        0.90 cm   LV e' lateral:   10.00 cm/s LVOT diam:     2.20 cm   LV E/e' lateral: 8.6 LV SV:         75 LV SV Index:   33 LVOT Area:     3.80 cm  RIGHT VENTRICLE RV S prime:     12.40 cm/s TAPSE (M-mode): 2.4 cm LEFT ATRIUM             Index        RIGHT ATRIUM           Index LA diam:        3.70 cm 1.62 cm/m   RA Area:     18.90 cm LA Vol (A2C):   77.3 ml 33.94 ml/m  RA Volume:   55.30 ml  24.28 ml/m LA Vol (A4C):   72.2 ml 31.70 ml/m LA Biplane Vol: 73.9 ml 32.45 ml/m  AORTIC VALVE LVOT Vmax:   93.30 cm/s LVOT Vmean:  59.200 cm/s LVOT VTI:    0.198 m  AORTA Ao Root diam: 2.80 cm Ao Asc diam:  2.70 cm MITRAL VALVE MV Area (PHT): 2.56 cm    SHUNTS MV Decel Time: 296 msec    Systemic VTI:  0.20 m MV E velocity: 86.50 cm/s  Systemic Diam: 2.20 cm MV A velocity: 51.40 cm/s MV E/A ratio:  1.68 Kardie Tobb DO Electronically signed by Thomasene Ripple DO Signature Date/Time: 04/17/2023/12:52:42 PM    Final      Medications:     Scheduled Medications:  aspirin EC  81 mg Oral Daily   atorvastatin  80 mg Oral Daily   enoxaparin (LOVENOX) injection  40 mg Subcutaneous Q24H   ezetimibe  10 mg Oral Daily   prasugrel  10 mg Oral Daily   sodium chloride flush  3 mL Intravenous Q12H    Infusions:   PRN Medications: acetaminophen, nitroGLYCERIN, ondansetron (ZOFRAN) IV, sodium chloride  flush    Assessment/Plan   1. CAD: H/o inferior MI in 2015 with DES, complicated by VF.  Acute inferior MI again this admission, distal LAD occluded.  Vessel opened with DES.  No chest pain today.  Walked unit yesterday with no problems. Echo showed EF 50-55%, mild-moderate MR, normal RV.  - Continue ASA and prasugrel. - Continue statin/Zetia.  - Restart home losartan 50 mg daily.  - Start Toprol XL 25 mg daily.  2. HTN: Restart home losartan 50 mg daily and start Toprol XL 25 mg daily.   I think he can go home today.  Will need followup with Dr. Clifton James and cardiac rehab.  Meds for home: ASA 81, prasugrel 10 mg daily, atorvastatin 80 daily, Zetia 10 daily, Toprol XL 25 daily, losartan 50 daily.   Length of Stay: 1  Marca Ancona, MD  04/18/2023, 9:30 AM  Advanced Heart Failure Team Pager [phone] (M-F; 7a - 5p)  Please contact [facility] for night-coverage after hours (5p -7a ) and weekends on [email or web address]

## 2023-04-18 NOTE — Discharge Summary (Addendum)
 Discharge Summary    Patient ID: Mark Levine MRN: [id number]; DOB: 11-26-1955  Admit date: 04/17/2023 Discharge date: 04/18/2023  PCP:  Pearson Grippe, MD   [facility] Providers Cardiologist:  Verne Carrow, MD   Discharge Diagnoses    Principal Problem:   STEMI involving right coronary artery [facility]) Active Problems:   CAD -s/p PCI + DES to RCA 04/02/13   Hyperlipidemia   CAD (coronary artery disease), native coronary artery   Essential hypertension   Cough due to ACE inhibitor    Diagnostic Studies/Procedures    LHC  04/17/23:   Dist RCA lesion is 100% stenosed.   Prox RCA to Mid RCA lesion is 35% stenosed.   Ost LM lesion is 30% stenosed.   Mid LAD lesion is 40% stenosed.   Ramus lesion is 60% stenosed.   A drug-eluting stent was successfully placed using a STENT SYNERGY XD 3.0X32.   Post intervention, there is a 0% residual stenosis.   The left ventricular systolic function is normal.   LV end diastolic pressure is mildly elevated.   The left ventricular ejection fraction is 55-65% by visual estimate.   Recommend uninterrupted dual antiplatelet therapy with Aspirin 81mg  daily and Prasugrel 10mg  daily for a minimum of 12 months (ACS-Class I recommendation).   Single vessel occlusive CAD Normal LV function Mildly elevated LVEDP 23 mm Hg Successful PCI of the distal RCA with DES (3.0 x 32 mm Synergy)   Plan: DAPT with ASA and Prasugrel for one year. Hold beta blocker for now. High dose ASA therapy.  _____________   Echo 04/17/23:  1. Left ventricular ejection fraction, by estimation, is 50 to 55%. The  left ventricle has low normal function. The left ventricle demonstrates  regional wall motion abnormalities (see scoring diagram/findings for  description). Left ventricular diastolic   parameters are indeterminate.   2. Right ventricular systolic function is normal. The right ventricular  size is moderately enlarged. Tricuspid regurgitation signal  is inadequate  for assessing PA pressure.   3. The mitral valve is abnormal. Mild to moderate mitral valve  regurgitation.   4. The aortic valve is tricuspid. Aortic valve regurgitation is not  visualized. Aortic valve sclerosis is present, with no evidence of aortic  valve stenosis.   5. The inferior vena cava is normal in size with greater than 50%  respiratory variability, suggesting right atrial pressure of 3 mmHg.   History of Present Illness     Mark Levine is a 68 y.o. male with with history of CAD, HTN and HLD here today for follow up. He has been followed in the past by Dr. Katrinka Blazing. He had an MI in 2015 and had a stent placed in the LAD. Echo in February 2021 with LVEF=55-60%. Grade 2 diastolic dysfunction. Mild MR.   Mark Levine is transferred from [facility] for acute inferior STEMI. 04/17/23. Pain started at 10 pm. Sudden. Some diaphoresis. Pain 10/10. Drove to ED. Ecg there shows new ST elevation inferiorly. Code STEMI activated. He is s/p inferior STEMI in 2015 with Vfib x 2 and subsequent stenting of RCA with 3.0 x 16 mm Promus stent. He has HTN and HLD.   Hospital Course     Consultants: none  Acute inferior STEMI Pt was taken emergently to the cath lab for revascularization. Heart cath showed distal RCA occlusion treated with 3.0 x 32 mm DES. Residual disease nonobstructive and treated medically. He was started on DAPT with ASA and effient.  Started 25 mg toprol. Continue 50 mg losartan. Increase lipitor from 40 mg to 80 mg and added zetia.    Hyperlipidemia with LDL goal < 70 04/17/2023: Cholesterol 107; HDL 35; LDL Cholesterol 68; Triglycerides 21; VLDL 4 Increased lipitor to 80 mg and added zetia.  - LPA in process - if elevated may be a candidate for PCSK9i   Mild cardiomyopathy - LVEDP 23 mmHg - echo with LVEF 50-55% - GDMT with toprol and losartan    Hx of inferior MI in 2015 - complicated by VF  - stent placed in RCA 3.0 x 16 mm DES - continue  risk factor modification   Hypertension - continue losartan and toprol as above   Celebrex discontinued in the setting of DAPT. Instructions in included in scripts for NTG and viagra. Pt seen and examined by Dr. Shirlee Latch and deemed stable for discharge. Pt ambulated after losartan and toprol. Follow up has been arranged. I confirmed with his home pharmacy that they had 30 tabs of effient available to dispense.        Did the patient have an acute coronary syndrome (MI, NSTEMI, STEMI, etc) this admission?:  Yes                               AHA/ACC ACS Clinical Performance & Quality Measures: Aspirin prescribed? - Yes ADP Receptor Inhibitor (Plavix/Clopidogrel, Brilinta/Ticagrelor or Effient/Prasugrel) prescribed (includes medically managed patients)? - Yes Beta Blocker prescribed? - Yes High Intensity Statin (Lipitor 40-80mg  or Crestor 20-40mg ) prescribed? - Yes EF assessed during THIS hospitalization? - Yes For EF <40%, was ACEI/ARB prescribed? - Yes For EF <40%, Aldosterone Antagonist (Spironolactone or Eplerenone) prescribed? - Not Applicable (EF >/= 40%) Cardiac Rehab Phase II ordered (including medically managed patients)? - Yes       The patient will be scheduled for a TOC follow up appointment in 7-14 days.  A message has been sent to the [facility] and Scheduling Pool at the office where the patient should be seen for follow up.  _____________  Discharge Vitals Blood pressure (!) 143/83, pulse 79, temperature 98.4 F (36.9 C), temperature source Oral, resp. rate 18, height 6' (1.829 m), weight 106.1 kg, SpO2 94%.  Filed Weights   04/17/23 0028  Weight: 106.1 kg    Labs & Radiologic Studies    CBC Recent Labs    04/17/23 0242 04/18/23 0226  WBC 6.2 6.9  HGB 14.7 15.0  HCT 44.3 43.7  MCV 90.0 87.9  PLT 106* 119*   Basic Metabolic Panel Recent Labs    09/81/19 0242 04/18/23 0226  NA 141 139  K 4.3 3.7  CL 108 107  CO2 23 25  GLUCOSE 141* 102*  BUN 19 16   CREATININE 1.04  1.00 1.11  CALCIUM 8.9 8.4*  MG  --  1.8   Liver Function Tests No results for input(s): "AST", "ALT", "ALKPHOS", "BILITOT", "PROT", "ALBUMIN" in the last 72 hours. No results for input(s): "LIPASE", "AMYLASE" in the last 72 hours. High Sensitivity Troponin:   Recent Labs  Lab 04/17/23 0014 04/17/23 0242  TROPONINIHS 6 152*    BNP Invalid input(s): "POCBNP" D-Dimer No results for input(s): "DDIMER" in the last 72 hours. Hemoglobin A1C Recent Labs    04/17/23 0242  HGBA1C 5.7*   Fasting Lipid Panel Recent Labs    04/17/23 0242  CHOL 107  HDL 35*  LDLCALC 68  TRIG 21  CHOLHDL 3.1  Thyroid Function Tests No results for input(s): "TSH", "T4TOTAL", "T3FREE", "THYROIDAB" in the last 72 hours.  Invalid input(s): "FREET3" _____________  ECHOCARDIOGRAM COMPLETE Result Date: 04/17/2023    ECHOCARDIOGRAM REPORT   Patient Name:   RODD HEFT [facility] Date of Exam: 04/17/2023 Medical Rec #:  [id number]       Height:       72.0 in Accession #:    [id number]      Weight:       234.0 lb Date of Birth:  12/13/1955       BSA:          2.278 m Patient Age:    67 years        BP:           128/75 mmHg Patient Gender: M               HR:           54 bpm. Exam Location:  Inpatient Procedure: 2D Echo, Color Doppler, Cardiac Doppler and Intracardiac            Opacification Agent (Both Spectral and Color Flow Doppler were            utilized during procedure). Indications:    Acute MI i21.9  History:        Patient has prior history of Echocardiogram examinations, most                 recent 03/16/2019. CAD; Risk Factors:Hypertension and                 Dyslipidemia.  Sonographer:    Irving Burton Senior RDCS Referring Phys: [id number] PETER M [place] IMPRESSIONS  1. Left ventricular ejection fraction, by estimation, is 50 to 55%. The left ventricle has low normal function. The left ventricle demonstrates regional wall motion abnormalities (see scoring diagram/findings for description). Left  ventricular diastolic  parameters are indeterminate.  2. Right ventricular systolic function is normal. The right ventricular size is moderately enlarged. Tricuspid regurgitation signal is inadequate for assessing PA pressure.  3. The mitral valve is abnormal. Mild to moderate mitral valve regurgitation.  4. The aortic valve is tricuspid. Aortic valve regurgitation is not visualized. Aortic valve sclerosis is present, with no evidence of aortic valve stenosis.  5. The inferior vena cava is normal in size with greater than 50% respiratory variability, suggesting right atrial pressure of 3 mmHg. FINDINGS  Left Ventricle: Left ventricular ejection fraction, by estimation, is 50 to 55%. The left ventricle has low normal function. The left ventricle demonstrates regional wall motion abnormalities. Definity contrast agent was given IV to delineate the left ventricular endocardial borders. The left ventricular internal cavity size was normal in size. There is no left ventricular hypertrophy. Left ventricular diastolic parameters are indeterminate.  LV Wall Scoring: The basal inferior segment is hypokinetic. Right Ventricle: The right ventricular size is moderately enlarged. No increase in right ventricular wall thickness. Right ventricular systolic function is normal. Tricuspid regurgitation signal is inadequate for assessing PA pressure. Left Atrium: Left atrial size was normal in size. Right Atrium: Right atrial size was normal in size. Pericardium: Trivial pericardial effusion is present. Mitral Valve: The mitral valve is abnormal. Mild to moderate mitral valve regurgitation. Tricuspid Valve: The tricuspid valve is normal in structure. Tricuspid valve regurgitation is trivial. Aortic Valve: The aortic valve is tricuspid. Aortic valve regurgitation is not visualized. Aortic valve sclerosis is present, with no evidence of aortic valve stenosis. Pulmonic Valve: The pulmonic  valve was normal in structure. Pulmonic valve  regurgitation is mild. Aorta: The aortic root and ascending aorta are structurally normal, with no evidence of dilitation. Venous: The inferior vena cava is normal in size with greater than 50% respiratory variability, suggesting right atrial pressure of 3 mmHg. IAS/Shunts: No atrial level shunt detected by color flow Doppler.  LEFT VENTRICLE PLAX 2D LVIDd:         5.40 cm   Diastology LVIDs:         3.40 cm   LV e' medial:    9.03 cm/s LV PW:         1.00 cm   LV E/e' medial:  9.6 LV IVS:        0.90 cm   LV e' lateral:   10.00 cm/s LVOT diam:     2.20 cm   LV E/e' lateral: 8.6 LV SV:         75 LV SV Index:   33 LVOT Area:     3.80 cm  RIGHT VENTRICLE RV S prime:     12.40 cm/s TAPSE (M-mode): 2.4 cm LEFT ATRIUM             Index        RIGHT ATRIUM           Index LA diam:        3.70 cm 1.62 cm/m   RA Area:     18.90 cm LA Vol (A2C):   77.3 ml 33.94 ml/m  RA Volume:   55.30 ml  24.28 ml/m LA Vol (A4C):   72.2 ml 31.70 ml/m LA Biplane Vol: 73.9 ml 32.45 ml/m  AORTIC VALVE LVOT Vmax:   93.30 cm/s LVOT Vmean:  59.200 cm/s LVOT VTI:    0.198 m  AORTA Ao Root diam: 2.80 cm Ao Asc diam:  2.70 cm MITRAL VALVE MV Area (PHT): 2.56 cm    SHUNTS MV Decel Time: 296 msec    Systemic VTI:  0.20 m MV E velocity: 86.50 cm/s  Systemic Diam: 2.20 cm MV A velocity: 51.40 cm/s MV E/A ratio:  1.68 Kardie Tobb DO Electronically signed by Thomasene Ripple DO Signature Date/Time: 04/17/2023/12:52:42 PM    Final    CARDIAC CATHETERIZATION Result Date: 04/17/2023   Dist RCA lesion is 100% stenosed.   Prox RCA to Mid RCA lesion is 35% stenosed.   Ost LM lesion is 30% stenosed.   Mid LAD lesion is 40% stenosed.   Ramus lesion is 60% stenosed.   A drug-eluting stent was successfully placed using a STENT SYNERGY XD 3.0X32.   Post intervention, there is a 0% residual stenosis.   The left ventricular systolic function is normal.   LV end diastolic pressure is mildly elevated.   The left ventricular ejection fraction is 55-65% by visual  estimate.   Recommend uninterrupted dual antiplatelet therapy with Aspirin 81mg  daily and Prasugrel 10mg  daily for a minimum of 12 months (ACS-Class I recommendation). Single vessel occlusive CAD Normal LV function Mildly elevated LVEDP 23 mm Hg Successful PCI of the distal RCA with DES (3.0 x 32 mm Synergy) Plan: DAPT with ASA and Prasugrel for one year. Hold beta blocker for now. High dose ASA therapy.   DG Chest Port 1 View Result Date: 04/17/2023 CLINICAL DATA:  Chest pain EXAM: PORTABLE CHEST 1 VIEW COMPARISON:  04/03/2013 FINDINGS: Heart and mediastinal contours are within normal limits. No focal opacities or effusions. No acute bony abnormality. IMPRESSION: No active disease. Electronically Signed  By: Charlett Nose M.D.   On: 04/17/2023 00:34   Disposition   Pt is being discharged home today in good condition.  Follow-up Plans & Appointments     Discharge Instructions     Amb Referral to Cardiac Rehabilitation   Complete by: As directed    Diagnosis:  STEMI PTCA Coronary Stents     After initial evaluation and assessments completed: Virtual Based Care may be provided alone or in conjunction with Phase 2 Cardiac Rehab based on patient barriers.: Yes   Intensive Cardiac Rehabilitation (ICR) [facility] location only OR Traditional Cardiac Rehabilitation (TCR) *If criteria for ICR are not met will enroll in TCR [facility] only): Yes   Diet - low sodium heart healthy   Complete by: As directed    Discharge instructions   Complete by: As directed    No driving for 2 days. No lifting over 5 lbs for 1 week. No sexual activity for 1 week. Keep procedure site clean & dry. If you notice increased pain, swelling, bleeding or pus, call/return!  You may shower, but no soaking baths/hot tubs/pools for 1 week.   Increase activity slowly   Complete by: As directed         Discharge Medications   Allergies as of 04/18/2023       Reactions   Other Swelling   All nuts " throw swelling"   Shellfish  Allergy Hives        Medication List     STOP taking these medications    celecoxib 100 MG capsule Commonly known as: CELEBREX   metoprolol tartrate 25 MG tablet Commonly known as: LOPRESSOR       TAKE these medications    aspirin EC 81 MG tablet Take 1 tablet (81 mg total) by mouth daily.   atorvastatin 80 MG tablet Commonly known as: LIPITOR Take 1 tablet (80 mg total) by mouth daily. Start taking on: April 19, 2023 What changed:  medication strength how much to take   ezetimibe 10 MG tablet Commonly known as: ZETIA Take 1 tablet (10 mg total) by mouth daily. Start taking on: April 19, 2023   losartan 50 MG tablet Commonly known as: COZAAR Take 50 mg by mouth daily.   metoprolol succinate 25 MG 24 hr tablet Commonly known as: TOPROL-XL Take 1 tablet (25 mg total) by mouth daily. Start taking on: April 19, 2023   nitroGLYCERIN 0.4 MG SL tablet Commonly known as: NITROSTAT Place 1 tablet (0.4 mg total) under the tongue every 5 (five) minutes x 3 doses as needed for chest pain. Do not take within 72 hrs of viagra What changed: additional instructions   prasugrel 10 MG Tabs tablet Commonly known as: EFFIENT Take 1 tablet (10 mg total) by mouth daily. Start taking on: April 19, 2023   traZODone 50 MG tablet Commonly known as: DESYREL Take 1 tablet by mouth as needed.   Viagra 100 MG tablet Generic drug: sildenafil Take 1 tablet (100 mg total) by mouth daily as needed for erectile dysfunction. Do not take within 72 hrs of nitroglycerin What changed: additional instructions           Outstanding Labs/Studies    Duration of Discharge Encounter: APP Time: 20 minutes   Signed, Marcelino Duster, PA 04/18/2023, 11:16 AM   Patient seen with PA, I formulated the plan and agree with the above note.   Please see my separate note from earlier today with discharge plain.   I spent 40  minutes with this discharge.   Marca Ancona 04/18/2023

## 2023-04-18 NOTE — Telephone Encounter (Signed)
   Transition of Care Follow-up Phone Call Request    Patient Name: Mark Levine Date of Birth: [birth year] Date of Encounter: 04/18/2023  Primary Care Provider:  Pearson Grippe, MD Primary Cardiologist:  Verne Carrow, MD  Mark Levine has been scheduled for a transition of care follow up appointment with a [facility] provider:  Tereso Newcomer.   Please reach out to [employer] within 48 hours of discharge to confirm appointment and review transition of care protocol questionnaire. Anticipated discharge date: 04/18/23  Mark Levine, [place]  04/18/2023, 11:14 AM

## 2023-04-19 ENCOUNTER — Other Ambulatory Visit (HOSPITAL_COMMUNITY): Payer: Self-pay

## 2023-04-19 ENCOUNTER — Encounter (HOSPITAL_COMMUNITY): Payer: Self-pay | Admitting: Cardiology

## 2023-04-19 LAB — LIPOPROTEIN A (LPA): Lipoprotein (a): 100.5 nmol/L — ABNORMAL HIGH (ref <75.0)

## 2023-04-19 NOTE — Telephone Encounter (Signed)
 Patient contacted regarding discharge from [facility] on 04/18/2023  Patient understands to follow up with provider Tereso Newcomer on 04/27/23 at 08:15 at [facility] Patient understands discharge instructions? No Patient understands medications and regiment? No Patient understands to bring all medications to this visit? Yes  Ask patient:  Are you enrolled in My Chart No   Discussed discharge instructions and reviewed medications list with patient in detail for 20 minutes. Patient verbalized understanding

## 2023-04-20 LAB — POCT ACTIVATED CLOTTING TIME: Activated Clotting Time: 487 s

## 2023-04-22 DIAGNOSIS — Z09 Encounter for follow-up examination after completed treatment for conditions other than malignant neoplasm: Secondary | ICD-10-CM | POA: Diagnosis not present

## 2023-04-22 DIAGNOSIS — I10 Essential (primary) hypertension: Secondary | ICD-10-CM | POA: Diagnosis not present

## 2023-04-22 DIAGNOSIS — I2510 Atherosclerotic heart disease of native coronary artery without angina pectoris: Secondary | ICD-10-CM | POA: Diagnosis not present

## 2023-04-22 DIAGNOSIS — I252 Old myocardial infarction: Secondary | ICD-10-CM | POA: Diagnosis not present

## 2023-04-22 DIAGNOSIS — F5101 Primary insomnia: Secondary | ICD-10-CM | POA: Diagnosis not present

## 2023-04-26 ENCOUNTER — Encounter: Payer: Self-pay | Admitting: Physician Assistant

## 2023-04-26 NOTE — Progress Notes (Signed)
 Cardiology Office Note:    Date:  04/27/2023  ID:  Mark Levine, DOB Jan 25, 1956, MRN [id number] PCP: Pearson Grippe, MD  [facility] Providers Cardiologist:  Verne Carrow, MD       Patient Profile:      Coronary artery disease  Inf STEMI in 04/2013 s/p DES to dRCA   Stress MPI 02/15/15: apical lateral infarct, no ischemia, EF 48 Inferior STEMI s/p 3 x 32 mm DES to dRCA in 04/2023 LHC 04/17/23: LM 30; LAD mid 40; RI 60; RCA prox 35, dist 100 ISR; EF 55-65  TTE 04/17/23: EF 50-55, NL RVSF, mild to mod MR, AV sclerosis, inf HK Mitral regurgitation  TTE 04/17/23: mild to mod MR Hypertension  ACE cough Hyperlipidemia   Lp(a): 100.5         Discussed the use of AI scribe software for clinical note transcription with the patient, who gave verbal consent to proceed.  History of Present Illness Mark Levine is a 68 y.o. male returns for post hospital follow up. He was admitted 3/15-3/16 with an inferior STEMI. Emergent cardiac catheterization demonstrated 100% occlusion of the distal RCA stent which was treated with a DES. Post PCI was uneventful.   He is here alone.  Since discharge, he has not experienced chest pain, pressure, or tightness, except for brief sensations on the Monday and Tuesday following discharge, which resolved quickly. No shortness of breath, dizziness, syncope, or leg swelling.  He has followed up with primary care.  He had a blood pressure reading of 160/90 mmHg, and the dose of losartan was increased to 100 mg daily.  However, he went back to 50 mg daily after readings normalized to less than 130/80 mmHg. He engages in light physical activity, such as using a stationary bike for 30 minutes daily and walking. He has not yet been contacted by cardiac rehab but is interested in participating again. No current smoking, but he smoked 40-50 years ago.  ROS-See HPI    Studies Reviewed:   EKG Interpretation Date/Time:  Tuesday April 27 2023 08:45:51  EDT Ventricular Rate:  65 PR Interval:  158 QRS Duration:  88 QT Interval:  396 QTC Calculation: 411 R Axis:   18  Text Interpretation: Normal sinus rhythm T wave abnormality, consider inferior ischemia When compared with ECG of 18-Apr-2023 07:10, Nonspecific T wave abnormality now evident in Lateral leads Confirmed by Tereso Newcomer [phone]) on 04/27/2023 9:10:07 AM    Results LABS Lipoprotein A: 100.5 Total cholesterol: 107 (04/17/2023) HDL: 35 (04/17/2023) LDL: 68 (04/17/2023) Triglycerides: 21 (04/17/2023) Hemoglobin: 15 (04/18/2023) Creatinine: 1.1 (04/18/2023) Potassium: 3.7 (04/18/2023)    Risk Assessment/Calculations:             Physical Exam:   VS:  BP 119/68   Pulse 66   Ht 6' (1.829 m)   Wt 227 lb (103 kg)   SpO2 95%   BMI 30.79 kg/m    Wt Readings from Last 3 Encounters:  04/27/23 227 lb (103 kg)  04/17/23 234 lb (106.1 kg)  02/26/23 233 lb (105.7 kg)    Constitutional:      Appearance: Healthy appearance. Not in distress.  Neck:     Vascular: JVD normal.  Pulmonary:     Breath sounds: Normal breath sounds. No wheezing. No rales.  Cardiovascular:     Normal rate. Regular rhythm.     Murmurs: There is no murmur.  Edema:    Peripheral edema absent.  Abdominal:  Palpations: Abdomen is soft.        Assessment and Plan:   Assessment & Plan STEMI involving right coronary artery [facility]) S/p previous inferior STEMI in 2015 treated with a drug-eluting stent (DES) to the distal RCA.  He is status post recent inferior STEMI in March 2025 due to occlusion of the distal RCA stent, treated with a new DES. Echocardiogram shows an ejection fraction of 50-55%, mild to moderate mitral regurgitation, and inferior hypokinesis.  He is doing well without recurrent chest discomfort to suggest angina.  We discussed the importance of cardiac rehabilitation as well as dual antiplatelet therapy for a minimum of 1 year. - Continue aspirin 81 mg daily - Continue Effient 10  mg daily for at least one year - Continue Lipitor 80 mg daily - Continue losartan 50 mg daily - Encourage cardiac rehabilitation Essential hypertension Hypertension is well-controlled on losartan 50 mg daily, with recent blood pressure readings consistently below 130/80 mmHg.  - Continue losartan 50 mg daily - Continue metoprolol succinate 25 mg daily Pure hypercholesterolemia Lipoprotein a level elevated at 100.5.  We discussed management of hyperlipidemia to include high intensity statin therapy as well as possible addition of PCSK9 inhibitor if LDL does not get to goal.  Goal LDL is <55. - Continue Lipitor 80 mg daily - Continue Zetia 10 mg daily - Recheck fasting lipids in 6-8 weeks - If LDL is not below 55, refer to lipid clinic for potential PCSK9 inhibitor therapy Nonrheumatic mitral valve regurgitation Echocardiogram 04/17/2023 demonstrated mild to moderate mitral regurgitation.  Consider repeat echocardiogram in 2 years.    Cardiac Rehabilitation Eligibility Assessment  The patient is ready to start cardiac rehabilitation from a cardiac standpoint.     Dispo:  Return in about 6 months (around 10/28/2023) for Routine Follow Up, w/ Dr. Clifton James.  Signed, Tereso Newcomer, PA-C

## 2023-04-27 ENCOUNTER — Encounter: Payer: Self-pay | Admitting: Physician Assistant

## 2023-04-27 ENCOUNTER — Ambulatory Visit: Attending: Physician Assistant | Admitting: Physician Assistant

## 2023-04-27 ENCOUNTER — Other Ambulatory Visit: Payer: Self-pay | Admitting: *Deleted

## 2023-04-27 VITALS — BP 119/68 | HR 66 | Ht 72.0 in | Wt 227.0 lb

## 2023-04-27 DIAGNOSIS — E7800 Pure hypercholesterolemia, unspecified: Secondary | ICD-10-CM

## 2023-04-27 DIAGNOSIS — I10 Essential (primary) hypertension: Secondary | ICD-10-CM

## 2023-04-27 DIAGNOSIS — I340 Nonrheumatic mitral (valve) insufficiency: Secondary | ICD-10-CM | POA: Diagnosis not present

## 2023-04-27 DIAGNOSIS — I2111 ST elevation (STEMI) myocardial infarction involving right coronary artery: Secondary | ICD-10-CM | POA: Diagnosis not present

## 2023-04-27 NOTE — Assessment & Plan Note (Signed)
 S/p previous inferior STEMI in 2015 treated with a drug-eluting stent (DES) to the distal RCA.  He is status post recent inferior STEMI in March 2025 due to occlusion of the distal RCA stent, treated with a new DES. Echocardiogram shows an ejection fraction of 50-55%, mild to moderate mitral regurgitation, and inferior hypokinesis.  He is doing well without recurrent chest discomfort to suggest angina.  We discussed the importance of cardiac rehabilitation as well as dual antiplatelet therapy for a minimum of 1 year. - Continue aspirin 81 mg daily - Continue Effient 10 mg daily for at least one year - Continue Lipitor 80 mg daily - Continue losartan 50 mg daily - Encourage cardiac rehabilitation

## 2023-04-27 NOTE — Assessment & Plan Note (Signed)
 Hypertension is well-controlled on losartan 50 mg daily, with recent blood pressure readings consistently below 130/80 mmHg.  - Continue losartan 50 mg daily - Continue metoprolol succinate 25 mg daily

## 2023-04-27 NOTE — Assessment & Plan Note (Signed)
 Lipoprotein a level elevated at 100.5.  We discussed management of hyperlipidemia to include high intensity statin therapy as well as possible addition of PCSK9 inhibitor if LDL does not get to goal.  Goal LDL is <55. - Continue Lipitor 80 mg daily - Continue Zetia 10 mg daily - Recheck fasting lipids in 6-8 weeks - If LDL is not below 55, refer to lipid clinic for potential PCSK9 inhibitor therapy

## 2023-04-27 NOTE — Patient Instructions (Signed)
 Medication Instructions:  Your physician recommends that you continue on your current medications as directed. Please refer to the Current Medication list given to you today.  *If you need a refill on your cardiac medications before your next appointment, please call your pharmacy*   Lab Work: 6-8 WEEKS GO TO A [facility] NEAR YOU, FASTING, FOR:  CMET & LIPID  [facility] locations:   [facility] - [address] ([address] [employer] office) - [address] ([facility]) - [address] [place]    [facility] - [address].    [place]  - [address]    [place] - [address] - [address] [employer] [facility]  - [address] [place] - [address] Chief Technology Officer)  If you have labs (blood work) drawn today and your tests are completely normal, you will receive your results only by: [employer] (if you have MyChart) OR A paper copy in the mail If you have any lab test that is abnormal or we need to change your treatment, we will call you to review the results.   Testing/Procedures: None ordered   Follow-Up: At [facility], you and your health needs are our priority.  As part of our continuing mission to provide you with exceptional heart care, we have created designated Provider Care Teams.  These Care Teams include your primary Cardiologist (physician) and Advanced Practice Providers (APPs -  Physician Assistants and Nurse Practitioners) who all work together to provide you with the care you need, when you need it.  We recommend signing up for the patient portal called "MyChart".  Sign up information is provided on this After Visit Summary.  MyChart is used to connect with patients for Virtual Visits (Telemedicine).  Patients are able to view lab/test results, encounter notes, upcoming appointments, etc.  Non-urgent messages can be  sent to your provider as well.   To learn more about what you can do with MyChart, go to [email or web address].    Your next appointment:   6 month(s)  Provider:   Verne Carrow, MD     Other Instructions     1st Floor: - Lobby - Registration  - Pharmacy  - Lab - Cafe  2nd Floor: - PV Lab - Diagnostic Testing (echo, CT, nuclear med)  3rd Floor: - Vacant  4th Floor: - TCTS (cardiothoracic surgery) - AFib Clinic - Structural Heart Clinic - Vascular Surgery  - Vascular Ultrasound  5th Floor: - [facility] (general and EP) - Clinical Pharmacy for coumadin, hypertension, lipid, weight-loss medications, and med management appointments    Valet parking services will be available as well.

## 2023-04-28 ENCOUNTER — Telehealth (HOSPITAL_COMMUNITY): Payer: Self-pay

## 2023-04-28 NOTE — Telephone Encounter (Signed)
 Pt insurance is active and benefits verified through [facility]. Co-pay $20, DED $0/$0 met, out of pocket $4,000/$80 met, co-insurance 0%. No pre-authorization required. Passport, 04/28/23 @ 1:58pm, REF# [id number].  How many CR sessions are covered? (36 visits for TCR, 72 visits for ICR)72 Is this a lifetime maximum or an annual maximum? Annual Has the member used any of these services to date? No Is there a time limit (weeks/months) on start of program and/or program completion? No   Will contact patient to see if he is interested in the Cardiac Rehab Program. If interested, patient will need to complete follow up appt. Once completed, patient will be contacted for scheduling upon review by the RN Navigator.

## 2023-04-28 NOTE — Telephone Encounter (Signed)
 Called patient to see if he was interested in participating in the Cardiac Rehab Program. Patient will come in for orientation on 04/30/23 @ 8AM and will attend the 815AM exercise class. Went over insurance, patient verbalized understanding.

## 2023-04-29 ENCOUNTER — Telehealth (HOSPITAL_COMMUNITY): Payer: Self-pay

## 2023-04-29 NOTE — Telephone Encounter (Signed)
  Called pt to confirm appt for 04/30/23 at 0800. Gave pt instructions for appt, what to wear, office address, eating/taking meds before, and if sick to call and reschedule. Pt voiced understanding, all questions answered.   Health history completed? Yes   Jonna Coup, MS, ACSM-CEP 04/29/2023 3:11 PM

## 2023-04-30 ENCOUNTER — Encounter (HOSPITAL_COMMUNITY)
Admission: RE | Admit: 2023-04-30 | Discharge: 2023-04-30 | Disposition: A | Discharge status: Home / Self Care | Source: Ambulatory Visit | Attending: Cardiovascular Disease | Admitting: Cardiovascular Disease

## 2023-04-30 VITALS — BP 118/72 | HR 76 | Ht 72.0 in | Wt 225.8 lb

## 2023-04-30 DIAGNOSIS — Z955 Presence of coronary angioplasty implant and graft: Secondary | ICD-10-CM | POA: Diagnosis not present

## 2023-04-30 DIAGNOSIS — Z48812 Encounter for surgical aftercare following surgery on the circulatory system: Secondary | ICD-10-CM | POA: Diagnosis not present

## 2023-04-30 DIAGNOSIS — I2111 ST elevation (STEMI) myocardial infarction involving right coronary artery: Secondary | ICD-10-CM | POA: Insufficient documentation

## 2023-04-30 NOTE — Progress Notes (Signed)
 Cardiac Rehab Medication Review   Does the patient  feel that his/her medications are working for him/her?  yes  Has the patient been experiencing any side effects to the medications prescribed?  no  Does the patient measure his/her own blood pressure or blood glucose at home?  yes   Does the patient have any problems obtaining medications due to transportation or finances?   no  Understanding of regimen: excellent Understanding of indications: excellent Potential of compliance: excellent    Comments: Mark Levine understands his medications and regime well. He checks his BP daily.     Mark Coup, MS, ACSM-CEP 04/30/2023 9:00 AM

## 2023-04-30 NOTE — Progress Notes (Signed)
 Cardiac Individual Treatment Plan  Patient Details  Name: Mark Levine MRN: [id number] Date of Birth: January 29, 1956 Referring Provider:   Flowsheet Row INTENSIVE CARDIAC REHAB ORIENT from 04/30/2023 in [facility]  Referring Provider Verne Carrow, MD       Initial Encounter Date:  Flowsheet Row INTENSIVE CARDIAC REHAB ORIENT from 04/30/2023 in [facility]  Date 04/30/23       Visit Diagnosis: 04/17/23 STEMI  04/17/23 DES RCA  Patient's Home Medications on Admission:  Current Outpatient Medications:    aspirin EC 81 MG EC tablet, Take 1 tablet (81 mg total) by mouth daily., Disp: , Rfl:    atorvastatin (LIPITOR) 80 MG tablet, Take 1 tablet (80 mg total) by mouth daily., Disp: 90 tablet, Rfl: 3   ezetimibe (ZETIA) 10 MG tablet, Take 1 tablet (10 mg total) by mouth daily., Disp: 90 tablet, Rfl: 3   losartan (COZAAR) 50 MG tablet, Take 50 mg by mouth daily., Disp: , Rfl:    Melatonin Gummies 5 MG CHEW, Chew 5 mg by mouth as needed (insomnia)., Disp: , Rfl:    metoprolol succinate (TOPROL-XL) 25 MG 24 hr tablet, Take 1 tablet (25 mg total) by mouth daily., Disp: 90 tablet, Rfl: 3   nitroGLYCERIN (NITROSTAT) 0.4 MG SL tablet, Place 1 tablet (0.4 mg total) under the tongue every 5 (five) minutes x 3 doses as needed for chest pain. Do not take within 72 hrs of viagra, Disp: 25 tablet, Rfl: 3   prasugrel (EFFIENT) 10 MG TABS tablet, Take 1 tablet (10 mg total) by mouth daily., Disp: 90 tablet, Rfl: 3   traZODone (DESYREL) 50 MG tablet, Take 1 tablet by mouth as needed., Disp: , Rfl:    VIAGRA 100 MG tablet, Take 1 tablet (100 mg total) by mouth daily as needed for erectile dysfunction. Do not take within 72 hrs of nitroglycerin, Disp: 10 tablet, Rfl: 1  Past Medical History: Past Medical History:  Diagnosis Date   Coronary Artery Disease 04/04/2013   Inf STEMI in  04/2013 s/p 3 x 16 mm DES to RCA     Stress MPI 02/15/15: apical lateral infarct, no ischemia, EF 48    Inf STEMI s/p 3 x 32 mm DES to dRCA in 04/2023      LHC 04/17/23: LM 30; LAD mid 40; RI 60; RCA prox 35, dist 100; EF 55-65     TTE 04/17/23: EF 50-55, NL RVSF, mild to mod MR, AV sclerosis, inf HK     Cough due to ACE inhibitor 05/11/2014   Lisinopril discontinued 05/11/14    Erectile dysfunction 10/11/2013   Essential hypertension 05/10/2014   History of nuclear stress test    a.Myoview 1/17: EF 48%, apical and apical lateral scar, no ischemia; Intermediate Risk (low EF)   Hyperlipidemia    MI, 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   Inferior STEMI  04/02/13     Tobacco Use: Social History   Tobacco Use  Smoking Status Never  Smokeless Tobacco Never    Labs: Review Flowsheet  More data may exist      Latest Ref Rng & Units 04/02/2013 04/03/2013 05/05/2013 02/15/2015 04/17/2023  Labs for ITP Cardiac and Pulmonary Rehab  Cholestrol 0 - 200 mg/dL - 409  811  914  782   LDL (calc) 0 - 99 mg/dL - 956  52  62  68   HDL-C >40 mg/dL - 45  42  40  35   Trlycerides <150 mg/dL - 098  33  65  21   Hemoglobin A1c 4.8 - 5.6 % 5.7  - - - 5.7     Capillary Blood Glucose: No results found for: "GLUCAP"   Exercise Target Goals: Exercise Program Goal: Individual exercise prescription set using results from initial 6 min walk test and THRR while considering  patient's activity barriers and safety.   Exercise Prescription Goal: Initial exercise prescription builds to 30-45 minutes a day of aerobic activity, 2-3 days per week.  Home exercise guidelines will be given to patient during program as part of exercise prescription that the participant will acknowledge.  Activity Barriers & Risk Stratification:  Activity Barriers & Cardiac Risk Stratification - 04/30/23 0902       Activity Barriers & Cardiac Risk Stratification   Activity Barriers Left Knee Replacement    Cardiac Risk Stratification High   <5 METs on             6 Minute Walk:  6 Minute Walk     Row Name 04/30/23 1023         6 Minute Walk   Phase Initial     Distance 1756 feet     Walk Time 6 minutes     # of Rest Breaks 0     MPH 3.38     METS 3.81     RPE 11     Perceived Dyspnea  0     VO2 Peak 13.35     Symptoms Yes (comment)     Comments L knee stiffness, no pain or s/sx     Resting HR 76 bpm     Resting BP 118/72     Resting Oxygen Saturation  99 %     Exercise Oxygen Saturation  during 6 min walk 99 %     Max Ex. HR 95 bpm     Max Ex. BP 142/70     2 Minute Post BP 126/70              Oxygen Initial Assessment:   Oxygen Re-Evaluation:   Oxygen Discharge (Final Oxygen Re-Evaluation):   Initial Exercise Prescription:  Initial Exercise Prescription - 04/30/23 1000       Date of Initial Exercise RX and Referring Provider   Date 04/30/23    Referring Provider Verne Carrow, MD    Expected Discharge Date 07/21/23      Bike   Level 1.5    Watts 40    Minutes 15    METs 2.5      NuStep   Level 2    SPM 70    Minutes 15    METs 2.5      Prescription Details   Frequency (times per week) 3    Duration Progress to 30 minutes of continuous aerobic without signs/symptoms of physical distress      Intensity   THRR 40-80% of Max Heartrate 61-123    Ratings of Perceived Exertion 11-13    Perceived Dyspnea 0-4      Progression   Progression Continue progressive overload as per policy without signs/symptoms or physical distress.      Resistance Training   Training Prescription Yes    Weight 4    Reps 10-15             Perform Capillary Blood Glucose checks as needed.  Exercise Prescription Changes:   Exercise Comments:   Exercise Goals  and Review:   Exercise Goals     Row Name 04/30/23 0902             Exercise Goals   Increase Physical Activity Yes       Intervention Provide advice, education, support and counseling about physical activity/exercise  needs.;Develop an individualized exercise prescription for aerobic and resistive training based on initial evaluation findings, risk stratification, comorbidities and participant's personal goals.       Expected Outcomes Short Term: Attend rehab on a regular basis to increase amount of physical activity.;Long Term: Exercising regularly at least 3-5 days a week.;Long Term: Add in home exercise to make exercise part of routine and to increase amount of physical activity.       Increase Strength and Stamina Yes       Intervention Provide advice, education, support and counseling about physical activity/exercise needs.;Develop an individualized exercise prescription for aerobic and resistive training based on initial evaluation findings, risk stratification, comorbidities and participant's personal goals.       Expected Outcomes Short Term: Increase workloads from initial exercise prescription for resistance, speed, and METs.;Long Term: Improve cardiorespiratory fitness, muscular endurance and strength as measured by increased METs and functional capacity ( );Short Term: Perform resistance training exercises routinely during rehab and add in resistance training at home       Able to understand and use rate of perceived exertion (RPE) scale Yes       Intervention Provide education and explanation on how to use RPE scale       Expected Outcomes Short Term: Able to use RPE daily in rehab to express subjective intensity level;Long Term:  Able to use RPE to guide intensity level when exercising independently       Knowledge and understanding of Target Heart Rate Range (THRR) Yes       Intervention Provide education and explanation of THRR including how the numbers were predicted and where they are located for reference       Expected Outcomes Short Term: Able to state/look up THRR;Short Term: Able to use daily as guideline for intensity in rehab;Long Term: Able to use THRR to govern intensity when exercising  independently       Understanding of Exercise Prescription Yes       Intervention Provide education, explanation, and written materials on patient's individual exercise prescription       Expected Outcomes Short Term: Able to explain program exercise prescription;Long Term: Able to explain home exercise prescription to exercise independently                Exercise Goals Re-Evaluation :   Discharge Exercise Prescription (Final Exercise Prescription Changes):   Nutrition:  Target Goals: Understanding of nutrition guidelines, daily intake of sodium 1500mg , cholesterol 200mg , calories 30% from fat and 7% or less from saturated fats, daily to have 5 or more servings of fruits and vegetables.  Biometrics:  Pre Biometrics - 04/30/23 0857       Pre Biometrics   Waist Circumference 44 inches    Hip Circumference 45 inches    Waist to Hip Ratio 0.98 %    Triceps Skinfold 18 mm    % Body Fat 30.8 %    Grip Strength 44 kg    Flexibility 14 in    Single Leg Stand 30 seconds              Nutrition Therapy Plan and Nutrition Goals:   Nutrition Assessments:  MEDIFICTS Score Key: >=70 Need to  make dietary changes  40-70 Heart Healthy Diet <= 40 Therapeutic Level Cholesterol Diet    Picture Your Plate Scores: <78 Unhealthy dietary pattern with much room for improvement. 41-50 Dietary pattern unlikely to meet recommendations for good health and room for improvement. 51-60 More healthful dietary pattern, with some room for improvement.  >60 Healthy dietary pattern, although there may be some specific behaviors that could be improved.    Nutrition Goals Re-Evaluation:   Nutrition Goals Re-Evaluation:   Nutrition Goals Discharge (Final Nutrition Goals Re-Evaluation):   Psychosocial: Target Goals: Acknowledge presence or absence of significant depression and/or stress, maximize coping skills, provide positive support system. Participant is able to verbalize types and  ability to use techniques and skills needed for reducing stress and depression.  Initial Review & Psychosocial Screening:  Initial Psych Review & Screening - 04/30/23 0902       Initial Review   Current issues with None Identified      Family Dynamics   Good Support System? Yes   Wife for support   Comments Lenard denies any feelings of depression/anxiety/stress. He shared that he often has to get up several times a night to use the restroom and that is disruptive to his sleep, however his medications he takes to help him sleep at night are working well for him. He denies any need for additional resources at this time.      Barriers   Psychosocial barriers to participate in program There are no identifiable barriers or psychosocial needs.      Screening Interventions   Interventions Encouraged to exercise;Provide feedback about the scores to participant    Expected Outcomes Short Term goal: Identification and review with participant of any Quality of Life or Depression concerns found by scoring the questionnaire.;Long Term goal: The participant improves quality of Life and PHQ9 Scores as seen by post scores and/or verbalization of changes;Long Term Goal: Stressors or current issues are controlled or eliminated.             Quality of Life Scores:  Quality of Life - 04/30/23 1024       Quality of Life   Select Quality of Life      Quality of Life Scores   Health/Function Pre 23.86 %    Socioeconomic Pre 23.14 %    Psych/Spiritual Pre 24.86 %    Family Pre 24.6 %    GLOBAL Pre 24.03 %            Scores of 19 and below usually indicate a poorer quality of life in these areas.  A difference of  2-3 points is a clinically meaningful difference.  A difference of 2-3 points in the total score of the Quality of Life Index has been associated with significant improvement in overall quality of life, self-image, physical symptoms, and general health in studies assessing change in  quality of life.  PHQ-9: Review Flowsheet       04/30/2023 07/24/2013 04/24/2013  Depression screen PHQ 2/9  Decreased Interest 0 0 0  Down, Depressed, Hopeless 0 0 0  PHQ - 2 Score 0 0 0  Altered sleeping 0 - -  Tired, decreased energy 0 - -  Change in appetite 0 - -  Feeling bad or failure about yourself  0 - -  Trouble concentrating 0 - -  Moving slowly or fidgety/restless 0 - -  Suicidal thoughts 0 - -  PHQ-9 Score 0 - -   Interpretation of Total Score  Total  Score Depression Severity:  1-4 = Minimal depression, 5-9 = Mild depression, 10-14 = Moderate depression, 15-19 = Moderately severe depression, 20-27 = Severe depression   Psychosocial Evaluation and Intervention:   Psychosocial Re-Evaluation:   Psychosocial Discharge (Final Psychosocial Re-Evaluation):   Vocational Rehabilitation: Provide vocational rehab assistance to qualifying candidates.   Vocational Rehab Evaluation & Intervention:  Vocational Rehab - 04/30/23 0904       Initial Vocational Rehab Evaluation & Intervention   Assessment shows need for Vocational Rehabilitation No   [employer] is retired.            Education: Education Goals: Education classes will be provided on a weekly basis, covering required topics. Participant will state understanding/return demonstration of topics presented.     Core Videos: Exercise    Move It!  Clinical staff conducted group or individual video education with verbal and written material and guidebook.  Patient learns the recommended Pritikin exercise program. Exercise with the goal of living a long, healthy life. Some of the health benefits of exercise include controlled diabetes, healthier blood pressure levels, improved cholesterol levels, improved heart and lung capacity, improved sleep, and better body composition. Everyone should speak with their doctor before starting or changing an exercise routine.  Biomechanical Limitations Clinical staff conducted  group or individual video education with verbal and written material and guidebook.  Patient learns how biomechanical limitations can impact exercise and how we can mitigate and possibly overcome limitations to have an impactful and balanced exercise routine.  Body Composition Clinical staff conducted group or individual video education with verbal and written material and guidebook.  Patient learns that body composition (ratio of muscle mass to fat mass) is a key component to assessing overall fitness, rather than body weight alone. Increased fat mass, especially visceral belly fat, can put [place] at increased risk for metabolic syndrome, type 2 diabetes, heart disease, and even death. It is recommended to combine diet and exercise (cardiovascular and resistance training) to improve your body composition. Seek guidance from your physician and exercise physiologist before implementing an exercise routine.  Exercise Action Plan Clinical staff conducted group or individual video education with verbal and written material and guidebook.  Patient learns the recommended strategies to achieve and enjoy long-term exercise adherence, including variety, self-motivation, self-efficacy, and positive decision making. Benefits of exercise include fitness, good health, weight management, more energy, better sleep, less stress, and overall well-being.  Medical   Heart Disease Risk Reduction Clinical staff conducted group or individual video education with verbal and written material and guidebook.  Patient learns our heart is our most vital organ as it circulates oxygen, nutrients, white blood cells, and hormones throughout the entire body, and carries waste away. Data supports a plant-based eating plan like the Pritikin Program for its effectiveness in slowing progression of and reversing heart disease. The video provides a number of recommendations to address heart disease.   Metabolic Syndrome and Belly Fat   Clinical staff conducted group or individual video education with verbal and written material and guidebook.  Patient learns what metabolic syndrome is, how it leads to heart disease, and how one can reverse it and keep it from coming back. You have metabolic syndrome if you have 3 of the following 5 criteria: abdominal obesity, high blood pressure, high triglycerides, low HDL cholesterol, and high blood sugar.  Hypertension and Heart Disease Clinical staff conducted group or individual video education with verbal and written material and guidebook.  Patient learns that high blood pressure,  or hypertension, is very common in the [place]. Hypertension is largely due to excessive salt intake, but other important risk factors include being overweight, physical inactivity, drinking too much alcohol, smoking, and not eating enough potassium from fruits and vegetables. High blood pressure is a leading risk factor for heart attack, stroke, congestive heart failure, dementia, kidney failure, and premature death. Long-term effects of excessive salt intake include stiffening of the arteries and thickening of heart muscle and organ damage. Recommendations include ways to reduce hypertension and the risk of heart disease.  Diseases of Our Time - Focusing on Diabetes Clinical staff conducted group or individual video education with verbal and written material and guidebook.  Patient learns why the best way to stop diseases of our time is prevention, through food and other lifestyle changes. Medicine (such as prescription pills and surgeries) is often only a Band-Aid on the problem, not a long-term solution. Most common diseases of our time include obesity, type 2 diabetes, hypertension, heart disease, and cancer. The Pritikin Program is recommended and has been proven to help reduce, reverse, and/or prevent the damaging effects of metabolic syndrome.  Nutrition   Overview of the Pritikin Eating Plan   Clinical staff conducted group or individual video education with verbal and written material and guidebook.  Patient learns about the Pritikin Eating Plan for disease risk reduction. The Pritikin Eating Plan emphasizes a wide variety of unrefined, minimally-processed carbohydrates, like fruits, vegetables, whole grains, and legumes. Go, Caution, and Stop food choices are explained. Plant-based and lean animal proteins are emphasized. Rationale provided for low sodium intake for blood pressure control, low added sugars for blood sugar stabilization, and low added fats and oils for coronary artery disease risk reduction and weight management.  Calorie Density  Clinical staff conducted group or individual video education with verbal and written material and guidebook.  Patient learns about calorie density and how it impacts the Pritikin Eating Plan. Knowing the characteristics of the food you choose will help you decide whether those foods will lead to weight gain or weight loss, and whether you want to consume more or less of them. Weight loss is usually a side effect of the Pritikin Eating Plan because of its focus on low calorie-dense foods.  Label Reading  Clinical staff conducted group or individual video education with verbal and written material and guidebook.  Patient learns about the Pritikin recommended label reading guidelines and corresponding recommendations regarding calorie density, added sugars, sodium content, and whole grains.  Dining Out - Part 1  Clinical staff conducted group or individual video education with verbal and written material and guidebook.  Patient learns that restaurant meals can be sabotaging because they can be so high in calories, fat, sodium, and/or sugar. Patient learns recommended strategies on how to positively address this and avoid unhealthy pitfalls.  Facts on Fats  Clinical staff conducted group or individual video education with verbal and written  material and guidebook.  Patient learns that lifestyle modifications can be just as effective, if not more so, as many medications for lowering your risk of heart disease. A Pritikin lifestyle can help to reduce your risk of inflammation and atherosclerosis (cholesterol build-up, or plaque, in the artery walls). Lifestyle interventions such as dietary choices and physical activity address the cause of atherosclerosis. A review of the types of fats and their impact on blood cholesterol levels, along with dietary recommendations to reduce fat intake is also included.  Nutrition Action Plan  Clinical staff conducted group  or individual video education with verbal and written material and guidebook.  Patient learns how to incorporate Pritikin recommendations into their lifestyle. Recommendations include planning and keeping personal health goals in mind as an important part of their success.  Healthy Mind-Set    Healthy Minds, Bodies, Hearts  Clinical staff conducted group or individual video education with verbal and written material and guidebook.  Patient learns how to identify when they are stressed. Video will discuss the impact of that stress, as well as the many benefits of stress management. Patient will also be introduced to stress management techniques. The way we think, act, and feel has an impact on our hearts.  How Our Thoughts Can Heal Our Hearts  Clinical staff conducted group or individual video education with verbal and written material and guidebook.  Patient learns that negative thoughts can cause depression and anxiety. This can result in negative lifestyle behavior and serious health problems. Cognitive behavioral therapy is an effective method to help control our thoughts in order to change and improve our emotional outlook.  Additional Videos:  Exercise    Improving Performance  Clinical staff conducted group or individual video education with verbal and written material and  guidebook.  Patient learns to use a non-linear approach by alternating intensity levels and lengths of time spent exercising to help burn more calories and lose more body fat. Cardiovascular exercise helps improve heart health, metabolism, hormonal balance, blood sugar control, and recovery from fatigue. Resistance training improves strength, endurance, balance, coordination, reaction time, metabolism, and muscle mass. Flexibility exercise improves circulation, posture, and balance. Seek guidance from your physician and exercise physiologist before implementing an exercise routine and learn your capabilities and proper form for all exercise.  Introduction to Yoga  Clinical staff conducted group or individual video education with verbal and written material and guidebook.  Patient learns about yoga, a discipline of the coming together of mind, breath, and body. The benefits of yoga include improved flexibility, improved range of motion, better posture and core strength, increased lung function, weight loss, and positive self-image. Yoga's heart health benefits include lowered blood pressure, healthier heart rate, decreased cholesterol and triglyceride levels, improved immune function, and reduced stress. Seek guidance from your physician and exercise physiologist before implementing an exercise routine and learn your capabilities and proper form for all exercise.  Medical   Aging: Enhancing Your Quality of Life  Clinical staff conducted group or individual video education with verbal and written material and guidebook.  Patient learns key strategies and recommendations to stay in good physical health and enhance quality of life, such as prevention strategies, having an advocate, securing a Health Care Proxy and Power of Attorney, and keeping a list of medications and system for tracking them. It also discusses how to avoid risk for bone loss.  Biology of Weight Control  Clinical staff conducted group or  individual video education with verbal and written material and guidebook.  Patient learns that weight gain occurs because we consume more calories than we burn (eating more, moving less). Even if your body weight is normal, you may have higher ratios of fat compared to muscle mass. Too much body fat puts you at increased risk for cardiovascular disease, heart attack, stroke, type 2 diabetes, and obesity-related cancers. In addition to exercise, following the Pritikin Eating Plan can help reduce your risk.  Decoding Lab Results  Clinical staff conducted group or individual video education with verbal and written material and guidebook.  Patient learns that lab  test reflects one measurement whose values change over time and are influenced by many factors, including medication, stress, sleep, exercise, food, hydration, pre-existing medical conditions, and more. It is recommended to use the knowledge from this video to become more involved with your lab results and evaluate your numbers to speak with your doctor.   Diseases of Our Time - Overview  Clinical staff conducted group or individual video education with verbal and written material and guidebook.  Patient learns that according to the CDC, 50% to 70% of chronic diseases (such as obesity, type 2 diabetes, elevated lipids, hypertension, and heart disease) are avoidable through lifestyle improvements including healthier food choices, listening to satiety cues, and increased physical activity.  Sleep Disorders Clinical staff conducted group or individual video education with verbal and written material and guidebook.  Patient learns how good quality and duration of sleep are important to overall health and well-being. Patient also learns about sleep disorders and how they impact health along with recommendations to address them, including discussing with a physician.  Nutrition  Dining Out - Part 2 Clinical staff conducted group or individual video  education with verbal and written material and guidebook.  Patient learns how to plan ahead and communicate in order to maximize their dining experience in a healthy and nutritious manner. Included are recommended food choices based on the type of restaurant the patient is visiting.   Fueling a Banker conducted group or individual video education with verbal and written material and guidebook.  There is a strong connection between our food choices and our health. Diseases like obesity and type 2 diabetes are very prevalent and are in large-part due to lifestyle choices. The Pritikin Eating Plan provides plenty of food and hunger-curbing satisfaction. It is easy to follow, affordable, and helps reduce health risks.  Menu Workshop  Clinical staff conducted group or individual video education with verbal and written material and guidebook.  Patient learns that restaurant meals can sabotage health goals because they are often packed with calories, fat, sodium, and sugar. Recommendations include strategies to plan ahead and to communicate with the manager, chef, or server to help order a healthier meal.  Planning Your Eating Strategy  Clinical staff conducted group or individual video education with verbal and written material and guidebook.  Patient learns about the Pritikin Eating Plan and its benefit of reducing the risk of disease. The Pritikin Eating Plan does not focus on calories. Instead, it emphasizes high-quality, nutrient-rich foods. By knowing the characteristics of the foods, we choose, we can determine their calorie density and make informed decisions.  Targeting Your Nutrition Priorities  Clinical staff conducted group or individual video education with verbal and written material and guidebook.  Patient learns that lifestyle habits have a tremendous impact on disease risk and progression. This video provides eating and physical activity recommendations based on your  personal health goals, such as reducing LDL cholesterol, losing weight, preventing or controlling type 2 diabetes, and reducing high blood pressure.  Vitamins and Minerals  Clinical staff conducted group or individual video education with verbal and written material and guidebook.  Patient learns different ways to obtain key vitamins and minerals, including through a recommended healthy diet. It is important to discuss all supplements you take with your doctor.   Healthy Mind-Set    Smoking Cessation  Clinical staff conducted group or individual video education with verbal and written material and guidebook.  Patient learns that cigarette smoking and tobacco addiction pose  a serious health risk which affects millions of people. Stopping smoking will significantly reduce the risk of heart disease, lung disease, and many forms of cancer. Recommended strategies for quitting are covered, including working with your doctor to develop a successful plan.  Culinary   Becoming a Set designer conducted group or individual video education with verbal and written material and guidebook.  Patient learns that cooking at home can be healthy, cost-effective, quick, and puts them in control. Keys to cooking healthy recipes will include looking at your recipe, assessing your equipment needs, planning ahead, making it simple, choosing cost-effective seasonal ingredients, and limiting the use of added fats, salts, and sugars.  Cooking - Breakfast and Snacks  Clinical staff conducted group or individual video education with verbal and written material and guidebook.  Patient learns how important breakfast is to satiety and nutrition through the entire day. Recommendations include key foods to eat during breakfast to help stabilize blood sugar levels and to prevent overeating at meals later in the day. Planning ahead is also a key component.  Cooking - Educational psychologist conducted  group or individual video education with verbal and written material and guidebook.  Patient learns eating strategies to improve overall health, including an approach to cook more at home. Recommendations include thinking of animal protein as a side on your plate rather than center stage and focusing instead on lower calorie dense options like vegetables, fruits, whole grains, and plant-based proteins, such as beans. Making sauces in large quantities to freeze for later and leaving the skin on your vegetables are also recommended to maximize your experience.  Cooking - Healthy Salads and Dressing Clinical staff conducted group or individual video education with verbal and written material and guidebook.  Patient learns that vegetables, fruits, whole grains, and legumes are the foundations of the Pritikin Eating Plan. Recommendations include how to incorporate each of these in flavorful and healthy salads, and how to create homemade salad dressings. Proper handling of ingredients is also covered. Cooking - Soups and [employer] - Soups and Desserts Clinical staff conducted group or individual video education with verbal and written material and guidebook.  Patient learns that Pritikin soups and desserts make for easy, nutritious, and delicious snacks and meal components that are low in sodium, fat, sugar, and calorie density, while high in vitamins, minerals, and filling fiber. Recommendations include simple and healthy ideas for soups and desserts.   Overview     The Pritikin Solution Program Overview Clinical staff conducted group or individual video education with verbal and written material and guidebook.  Patient learns that the results of the Pritikin Program have been documented in more than 100 articles published in peer-reviewed journals, and the benefits include reducing risk factors for (and, in some cases, even reversing) high cholesterol, high blood pressure, type 2 diabetes, obesity,  and more! An overview of the three key pillars of the Pritikin Program will be covered: eating well, doing regular exercise, and having a healthy mind-set.  WORKSHOPS  Exercise: Exercise Basics: Building Your Action Plan Clinical staff led group instruction and group discussion with PowerPoint presentation and patient guidebook. To enhance the learning environment the use of posters, models and videos may be added. At the conclusion of this workshop, patients will comprehend the difference between physical activity and exercise, as well as the benefits of incorporating both, into their routine. Patients will understand the FITT (Frequency, Intensity, Time, and Type) principle and how  to use it to build an exercise action plan. In addition, safety concerns and other considerations for exercise and cardiac rehab will be addressed by the presenter. The purpose of this lesson is to promote a comprehensive and effective weekly exercise routine in order to improve patients' overall level of fitness.   Managing Heart Disease: Your Path to a Healthier Heart Clinical staff led group instruction and group discussion with PowerPoint presentation and patient guidebook. To enhance the learning environment the use of posters, models and videos may be added.At the conclusion of this workshop, patients will understand the anatomy and physiology of the heart. Additionally, they will understand how Pritikin's three pillars impact the risk factors, the progression, and the management of heart disease.  The purpose of this lesson is to provide a high-level overview of the heart, heart disease, and how the Pritikin lifestyle positively impacts risk factors.  Exercise Biomechanics Clinical staff led group instruction and group discussion with PowerPoint presentation and patient guidebook. To enhance the learning environment the use of posters, models and videos may be added. Patients will learn how the structural  parts of their bodies function and how these functions impact their daily activities, movement, and exercise. Patients will learn how to promote a neutral spine, learn how to manage pain, and identify ways to improve their physical movement in order to promote healthy living. The purpose of this lesson is to expose patients to common physical limitations that impact physical activity. Participants will learn practical ways to adapt and manage aches and pains, and to minimize their effect on regular exercise. Patients will learn how to maintain good posture while sitting, walking, and lifting.  Balance Training and Fall Prevention  Clinical staff led group instruction and group discussion with PowerPoint presentation and patient guidebook. To enhance the learning environment the use of posters, models and videos may be added. At the conclusion of this workshop, patients will understand the importance of their sensorimotor skills (vision, proprioception, and the vestibular system) in maintaining their ability to balance as they age. Patients will apply a variety of balancing exercises that are appropriate for their current level of function. Patients will understand the common causes for poor balance, possible solutions to these problems, and ways to modify their physical environment in order to minimize their fall risk. The purpose of this lesson is to teach patients about the importance of maintaining balance as they age and ways to minimize their risk of falling.  WORKSHOPS   Nutrition:  Fueling a Ship broker led group instruction and group discussion with PowerPoint presentation and patient guidebook. To enhance the learning environment the use of posters, models and videos may be added. Patients will review the foundational principles of the Pritikin Eating Plan and understand what constitutes a serving size in each of the food groups. Patients will also learn Pritikin-friendly  foods that are better choices when away from home and review make-ahead meal and snack options. Calorie density will be reviewed and applied to three nutrition priorities: weight maintenance, weight loss, and weight gain. The purpose of this lesson is to reinforce (in a group setting) the key concepts around what patients are recommended to eat and how to apply these guidelines when away from home by planning and selecting Pritikin-friendly options. Patients will understand how calorie density may be adjusted for different weight management goals.  Mindful Eating  Clinical staff led group instruction and group discussion with PowerPoint presentation and patient guidebook. To enhance the learning environment  the use of posters, models and videos may be added. Patients will briefly review the concepts of the Pritikin Eating Plan and the importance of low-calorie dense foods. The concept of mindful eating will be introduced as well as the importance of paying attention to internal hunger signals. Triggers for non-hunger eating and techniques for dealing with triggers will be explored. The purpose of this lesson is to provide patients with the opportunity to review the basic principles of the Pritikin Eating Plan, discuss the value of eating mindfully and how to measure internal cues of hunger and fullness using the Hunger Scale. Patients will also discuss reasons for non-hunger eating and learn strategies to use for controlling emotional eating.  Targeting Your Nutrition Priorities Clinical staff led group instruction and group discussion with PowerPoint presentation and patient guidebook. To enhance the learning environment the use of posters, models and videos may be added. Patients will learn how to determine their genetic susceptibility to disease by reviewing their family history. Patients will gain insight into the importance of diet as part of an overall healthy lifestyle in mitigating the impact of  genetics and other environmental insults. The purpose of this lesson is to provide patients with the opportunity to assess their personal nutrition priorities by looking at their family history, their own health history and current risk factors. Patients will also be able to discuss ways of prioritizing and modifying the Pritikin Eating Plan for their highest risk areas  Menu  Clinical staff led group instruction and group discussion with PowerPoint presentation and patient guidebook. To enhance the learning environment the use of posters, models and videos may be added. Using menus brought in from [employer], or printed from [employer], patients will apply the Pritikin dining out guidelines that were presented in the [employer] video. Patients will also be able to practice these guidelines in a variety of provided scenarios. The purpose of this lesson is to provide patients with the opportunity to practice hands-on learning of the Pritikin Dining Out guidelines with actual menus and practice scenarios.  Label Reading Clinical staff led group instruction and group discussion with PowerPoint presentation and patient guidebook. To enhance the learning environment the use of posters, models and videos may be added. Patients will review and discuss the Pritikin label reading guidelines presented in Pritikin's Label Reading Educational series video. Using fool labels brought in from local grocery stores and markets, patients will apply the label reading guidelines and determine if the packaged food meet the Pritikin guidelines. The purpose of this lesson is to provide patients with the opportunity to review, discuss, and practice hands-on learning of the Pritikin Label Reading guidelines with actual packaged food labels. Cooking School  Pritikin's [employer] are designed to teach patients ways to prepare quick, simple, and affordable recipes at home. The importance of  nutrition's role in chronic disease risk reduction is reflected in its emphasis in the overall Pritikin program. By learning how to prepare essential core Pritikin Eating Plan recipes, patients will increase control over what they eat; be able to customize the flavor of foods without the use of added salt, sugar, or fat; and improve the quality of the food they consume. By learning a set of core recipes which are easily assembled, quickly prepared, and affordable, patients are more likely to prepare more healthy foods at home. These workshops focus on convenient breakfasts, simple entres, side dishes, and desserts which can be prepared with minimal effort and are consistent with  nutrition recommendations for cardiovascular risk reduction. Cooking [employer] are taught by a Armed forces logistics/support/administrative officer (RD) who has been trained by the [employer]. The chef or RD has a clear understanding of the importance of minimizing - if not completely eliminating - added fat, sugar, and sodium in recipes. Throughout the series of Cooking School Workshop sessions, patients will learn about healthy ingredients and efficient methods of cooking to build confidence in their capability to prepare    Cooking School weekly topics:  Adding Flavor- Sodium-Free  Fast and Healthy Breakfasts  Powerhouse Plant-Based Proteins  Satisfying Salads and Dressings  Simple Sides and Sauces  International Cuisine-Spotlight on the [employer] Zones  Delicious Desserts  Savory Soups  [employer] - Meals in a Astronomer Appetizers and Snacks  Comforting Weekend Breakfasts  One-Pot Wonders   Fast Evening Meals  Landscape architect Your Pritikin Plate  WORKSHOPS   Healthy Mindset (Psychosocial):  Focused Goals, Sustainable Changes Clinical staff led group instruction and group discussion with PowerPoint presentation and patient guidebook. To enhance the learning environment the use of posters,  models and videos may be added. Patients will be able to apply effective goal setting strategies to establish at least one personal goal, and then take consistent, meaningful action toward that goal. They will learn to identify common barriers to achieving personal goals and develop strategies to overcome them. Patients will also gain an understanding of how our mind-set can impact our ability to achieve goals and the importance of cultivating a positive and growth-oriented mind-set. The purpose of this lesson is to provide patients with a deeper understanding of how to set and achieve personal goals, as well as the tools and strategies needed to overcome common obstacles which may arise along the way.  From Head to Heart: The Power of a Healthy Outlook  Clinical staff led group instruction and group discussion with PowerPoint presentation and patient guidebook. To enhance the learning environment the use of posters, models and videos may be added. Patients will be able to recognize and describe the impact of emotions and mood on physical health. They will discover the importance of self-care and explore self-care practices which may work for them. Patients will also learn how to utilize the 4 C's to cultivate a healthier outlook and better manage stress and challenges. The purpose of this lesson is to demonstrate to patients how a healthy outlook is an essential part of maintaining good health, especially as they continue their cardiac rehab journey.  Healthy Sleep for a Healthy Heart Clinical staff led group instruction and group discussion with PowerPoint presentation and patient guidebook. To enhance the learning environment the use of posters, models and videos may be added. At the conclusion of this workshop, patients will be able to demonstrate knowledge of the importance of sleep to overall health, well-being, and quality of life. They will understand the symptoms of, and treatments for, common sleep  disorders. Patients will also be able to identify daytime and nighttime behaviors which impact sleep, and they will be able to apply these tools to help manage sleep-related challenges. The purpose of this lesson is to provide patients with a general overview of sleep and outline the importance of quality sleep. Patients will learn about a few of the most common sleep disorders. Patients will also be introduced to the concept of "sleep hygiene," and discover ways to self-manage certain sleeping problems through simple daily behavior changes. Finally, the workshop will motivate patients  by clarifying the links between quality sleep and their goals of heart-healthy living.   Recognizing and Reducing Stress Clinical staff led group instruction and group discussion with PowerPoint presentation and patient guidebook. To enhance the learning environment the use of posters, models and videos may be added. At the conclusion of this workshop, patients will be able to understand the types of stress reactions, differentiate between acute and chronic stress, and recognize the impact that chronic stress has on their health. They will also be able to apply different coping mechanisms, such as reframing negative self-talk. Patients will have the opportunity to practice a variety of stress management techniques, such as deep abdominal breathing, progressive muscle relaxation, and/or guided imagery.  The purpose of this lesson is to educate patients on the role of stress in their lives and to provide healthy techniques for coping with it.  Learning Barriers/Preferences:  Learning Barriers/Preferences - 04/30/23 4010       Learning Barriers/Preferences   Learning Barriers Sight   glasses   Learning Preferences Audio;Computer/Internet;Group Instruction;Individual Instruction;Skilled Demonstration;Verbal Instruction;Video;Written Material;Pictoral             Education Topics:  Knowledge Questionnaire Score:   Knowledge Questionnaire Score - 04/30/23 0906       Knowledge Questionnaire Score   Pre Score 19/24             Core Components/Risk Factors/Patient Goals at Admission:  Personal Goals and Risk Factors at Admission - 04/30/23 0905       Core Components/Risk Factors/Patient Goals on Admission    Weight Management Yes;Obesity;Weight Loss    Intervention Weight Management: Develop a combined nutrition and exercise program designed to reach desired caloric intake, while maintaining appropriate intake of nutrient and fiber, sodium and fats, and appropriate energy expenditure required for the weight goal.;Weight Management: Provide education and appropriate resources to help participant work on and attain dietary goals.;Weight Management/Obesity: Establish reasonable short term and long term weight goals.;Obesity: Provide education and appropriate resources to help participant work on and attain dietary goals.    Admit Weight 225 lb (102.1 kg)    Goal Weight: Long Term 205 lb (93 kg)   pt goal   Hypertension Yes    Intervention Provide education on lifestyle modifcations including regular physical activity/exercise, weight management, moderate sodium restriction and increased consumption of fresh fruit, vegetables, and low fat dairy, alcohol moderation, and smoking cessation.;Monitor prescription use compliance.    Expected Outcomes Short Term: Continued assessment and intervention until BP is < 140/38mm HG in hypertensive participants. < 130/59mm HG in hypertensive participants with diabetes, heart failure or chronic kidney disease.;Long Term: Maintenance of blood pressure at goal levels.    Lipids Yes    Intervention Provide education and support for participant on nutrition & aerobic/resistive exercise along with prescribed medications to achieve LDL 70mg , HDL >40mg .    Expected Outcomes Long Term: Cholesterol controlled with medications as prescribed, with individualized exercise RX and with  personalized nutrition plan. Value goals: LDL < 70mg , HDL > 40 mg.;Short Term: Participant states understanding of desired cholesterol values and is compliant with medications prescribed. Participant is following exercise prescription and nutrition guidelines.             Core Components/Risk Factors/Patient Goals Review:    Core Components/Risk Factors/Patient Goals at Discharge (Final Review):    ITP Comments:  ITP Comments     Row Name 04/30/23 0858           ITP Comments Dr. Armanda Magic medical  Interior and spatial designer. Introduction to pritikin education/intensive cardiac rehab. Initial orientation packet reviewed with patient.                Comments: Participant attended orientation for the cardiac rehabilitation program on  04/30/2023  to perform initial intake and exercise walk test. Patient introduced to the Pritikin Program education and orientation packet was reviewed. Completed 6-minute walk test, measurements, initial ITP, and exercise prescription. Vital signs stable. Telemetry-normal sinus rhythm, asymptomatic.   Service time was from [phone] to [phone].  Jonna Coup, MS, ACSM-CEP 04/30/2023 10:32 AM

## 2023-05-03 ENCOUNTER — Encounter (HOSPITAL_COMMUNITY)
Admission: RE | Admit: 2023-05-03 | Discharge: 2023-05-03 | Disposition: A | Discharge status: Home / Self Care | Source: Ambulatory Visit | Attending: Cardiovascular Disease | Admitting: Cardiovascular Disease

## 2023-05-03 DIAGNOSIS — Z48812 Encounter for surgical aftercare following surgery on the circulatory system: Secondary | ICD-10-CM | POA: Diagnosis not present

## 2023-05-03 DIAGNOSIS — Z955 Presence of coronary angioplasty implant and graft: Secondary | ICD-10-CM

## 2023-05-03 DIAGNOSIS — I2111 ST elevation (STEMI) myocardial infarction involving right coronary artery: Secondary | ICD-10-CM

## 2023-05-03 NOTE — Progress Notes (Signed)
 Cardiac Individual Treatment Plan  Patient Details  Name: Mark Levine MRN: [id number] Date of Birth: April 11, 1955 Referring Provider:   Flowsheet Row INTENSIVE CARDIAC REHAB ORIENT from 04/30/2023 in [facility]  Referring Provider Verne Carrow, MD       Initial Encounter Date:  Flowsheet Row INTENSIVE CARDIAC REHAB ORIENT from 04/30/2023 in [facility]  Date 04/30/23       Visit Diagnosis: 04/17/23 STEMI  04/17/23 DES RCA  Patient's Home Medications on Admission:  Current Outpatient Medications:    aspirin EC 81 MG EC tablet, Take 1 tablet (81 mg total) by mouth daily., Disp: , Rfl:    atorvastatin (LIPITOR) 80 MG tablet, Take 1 tablet (80 mg total) by mouth daily., Disp: 90 tablet, Rfl: 3   ezetimibe (ZETIA) 10 MG tablet, Take 1 tablet (10 mg total) by mouth daily., Disp: 90 tablet, Rfl: 3   losartan (COZAAR) 50 MG tablet, Take 50 mg by mouth daily., Disp: , Rfl:    Melatonin Gummies 5 MG CHEW, Chew 5 mg by mouth as needed (insomnia)., Disp: , Rfl:    metoprolol succinate (TOPROL-XL) 25 MG 24 hr tablet, Take 1 tablet (25 mg total) by mouth daily., Disp: 90 tablet, Rfl: 3   nitroGLYCERIN (NITROSTAT) 0.4 MG SL tablet, Place 1 tablet (0.4 mg total) under the tongue every 5 (five) minutes x 3 doses as needed for chest pain. Do not take within 72 hrs of viagra, Disp: 25 tablet, Rfl: 3   prasugrel (EFFIENT) 10 MG TABS tablet, Take 1 tablet (10 mg total) by mouth daily., Disp: 90 tablet, Rfl: 3   traZODone (DESYREL) 50 MG tablet, Take 1 tablet by mouth as needed., Disp: , Rfl:    VIAGRA 100 MG tablet, Take 1 tablet (100 mg total) by mouth daily as needed for erectile dysfunction. Do not take within 72 hrs of nitroglycerin, Disp: 10 tablet, Rfl: 1  Past Medical History: Past Medical History:  Diagnosis Date   Coronary Artery Disease 04/04/2013   Inf STEMI in  04/2013 s/p 3 x 16 mm DES to RCA     Stress MPI 02/15/15: apical lateral infarct, no ischemia, EF 48    Inf STEMI s/p 3 x 32 mm DES to dRCA in 04/2023      LHC 04/17/23: LM 30; LAD mid 40; RI 60; RCA prox 35, dist 100; EF 55-65     TTE 04/17/23: EF 50-55, NL RVSF, mild to mod MR, AV sclerosis, inf HK     Cough due to ACE inhibitor 05/11/2014   Lisinopril discontinued 05/11/14    Erectile dysfunction 10/11/2013   Essential hypertension 05/10/2014   History of nuclear stress test    a.Myoview 1/17: EF 48%, apical and apical lateral scar, no ischemia; Intermediate Risk (low EF)   Hyperlipidemia    MI, 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   Inferior STEMI  04/02/13     Tobacco Use: Social History   Tobacco Use  Smoking Status Never  Smokeless Tobacco Never    Labs: Review Flowsheet  More data may exist      Latest Ref Rng & Units 04/02/2013 04/03/2013 05/05/2013 02/15/2015 04/17/2023  Labs for ITP Cardiac and Pulmonary Rehab  Cholestrol 0 - 200 mg/dL - 409  811  914  782   LDL (calc) 0 - 99 mg/dL - 956  52  62  68   HDL-C >40 mg/dL - 45  42  40  35   Trlycerides <150 mg/dL - 161  33  65  21   Hemoglobin A1c 4.8 - 5.6 % 5.7  - - - 5.7     Capillary Blood Glucose: No results found for: "GLUCAP"   Exercise Target Goals: Exercise Program Goal: Individual exercise prescription set using results from initial 6 min walk test and THRR while considering  patient's activity barriers and safety.   Exercise Prescription Goal: Initial exercise prescription builds to 30-45 minutes a day of aerobic activity, 2-3 days per week.  Home exercise guidelines will be given to patient during program as part of exercise prescription that the participant will acknowledge.  Activity Barriers & Risk Stratification:  Activity Barriers & Cardiac Risk Stratification - 04/30/23 0902       Activity Barriers & Cardiac Risk Stratification   Activity Barriers Left Knee Replacement    Cardiac Risk Stratification High   <5 METs on             6 Minute Walk:  6 Minute Walk     Row Name 04/30/23 1023         6 Minute Walk   Phase Initial     Distance 1756 feet     Walk Time 6 minutes     # of Rest Breaks 0     MPH 3.38     METS 3.81     RPE 11     Perceived Dyspnea  0     VO2 Peak 13.35     Symptoms Yes (comment)     Comments L knee stiffness, no pain or s/sx     Resting HR 76 bpm     Resting BP 118/72     Resting Oxygen Saturation  99 %     Exercise Oxygen Saturation  during 6 min walk 99 %     Max Ex. HR 95 bpm     Max Ex. BP 142/70     2 Minute Post BP 126/70              Oxygen Initial Assessment:   Oxygen Re-Evaluation:   Oxygen Discharge (Final Oxygen Re-Evaluation):   Initial Exercise Prescription:  Initial Exercise Prescription - 04/30/23 1000       Date of Initial Exercise RX and Referring Provider   Date 04/30/23    Referring Provider Verne Carrow, MD    Expected Discharge Date 07/21/23      Bike   Level 1.5    Watts 40    Minutes 15    METs 2.5      NuStep   Level --    SPM --    Minutes --    METs --      Recumbant Elliptical   Level 2    RPM 60    Watts 20    Minutes 15    METs 2      Prescription Details   Frequency (times per week) 3    Duration Progress to 30 minutes of continuous aerobic without signs/symptoms of physical distress      Intensity   THRR 40-80% of Max Heartrate 61-123    Ratings of Perceived Exertion 11-13    Perceived Dyspnea 0-4      Progression   Progression Continue progressive overload as per policy without signs/symptoms or physical distress.      Resistance Training   Training Prescription Yes    Weight 4    Reps 10-15  Perform Capillary Blood Glucose checks as needed.  Exercise Prescription Changes:   Exercise Prescription Changes     Row Name 05/03/23 1100             Response to Exercise   Blood Pressure (Admit) 112/62       Blood Pressure (Exercise) 128/78       Blood Pressure  (Exit) 118/72       Heart Rate (Admit) 74 bpm       Heart Rate (Exercise) 85 bpm       Heart Rate (Exit) 75 bpm       Rating of Perceived Exertion (Exercise) 11       Symptoms None       Comments Pt's first day in the CRP2 program       Duration Continue with 30 min of aerobic exercise without signs/symptoms of physical distress.       Intensity THRR unchanged         Progression   Progression Continue to progress workloads to maintain intensity without signs/symptoms of physical distress.       Average METs 2.4         Resistance Training   Training Prescription Yes       Weight 4 lbs       Reps 10-15       Time 10 Minutes         Interval Training   Interval Training No         Bike   Watts 19.5       Minutes 15       METs 2.59         Recumbant Elliptical   Level 1       RPM 54       Watts 64       Minutes 15       METs 2.2                Exercise Comments:   Exercise Comments     Row Name 05/03/23 1133           Exercise Comments Pt's first day in the CRP2 program. Pt completed session with no complaints.                Exercise Goals and Review:   Exercise Goals     Row Name 04/30/23 0902             Exercise Goals   Increase Physical Activity Yes       Intervention Provide advice, education, support and counseling about physical activity/exercise needs.;Develop an individualized exercise prescription for aerobic and resistive training based on initial evaluation findings, risk stratification, comorbidities and participant's personal goals.       Expected Outcomes Short Term: Attend rehab on a regular basis to increase amount of physical activity.;Long Term: Exercising regularly at least 3-5 days a week.;Long Term: Add in home exercise to make exercise part of routine and to increase amount of physical activity.       Increase Strength and Stamina Yes       Intervention Provide advice, education, support and counseling about physical  activity/exercise needs.;Develop an individualized exercise prescription for aerobic and resistive training based on initial evaluation findings, risk stratification, comorbidities and participant's personal goals.       Expected Outcomes Short Term: Increase workloads from initial exercise prescription for resistance, speed, and METs.;Long Term: Improve cardiorespiratory fitness, muscular endurance and strength as measured by increased  METs and functional capacity ( );Short Term: Perform resistance training exercises routinely during rehab and add in resistance training at home       Able to understand and use rate of perceived exertion (RPE) scale Yes       Intervention Provide education and explanation on how to use RPE scale       Expected Outcomes Short Term: Able to use RPE daily in rehab to express subjective intensity level;Long Term:  Able to use RPE to guide intensity level when exercising independently       Knowledge and understanding of Target Heart Rate Range (THRR) Yes       Intervention Provide education and explanation of THRR including how the numbers were predicted and where they are located for reference       Expected Outcomes Short Term: Able to state/look up THRR;Short Term: Able to use daily as guideline for intensity in rehab;Long Term: Able to use THRR to govern intensity when exercising independently       Understanding of Exercise Prescription Yes       Intervention Provide education, explanation, and written materials on patient's individual exercise prescription       Expected Outcomes Short Term: Able to explain program exercise prescription;Long Term: Able to explain home exercise prescription to exercise independently                Exercise Goals Re-Evaluation :  Exercise Goals Re-Evaluation     Row Name 05/03/23 1132             Exercise Goal Re-Evaluation   Exercise Goals Review Increase Physical Activity;Increase Strength and Stamina;Able to  understand and use rate of perceived exertion (RPE) scale;Knowledge and understanding of Target Heart Rate Range (THRR);Understanding of Exercise Prescription       Comments Pt's first day in the CRP2 program. Pt understands the exercise Rx, RPE scale and THRR.       Expected Outcomes Will continue to monitor patient and progress exercise workloads as tolerated.                Discharge Exercise Prescription (Final Exercise Prescription Changes):  Exercise Prescription Changes - 05/03/23 1100       Response to Exercise   Blood Pressure (Admit) 112/62    Blood Pressure (Exercise) 128/78    Blood Pressure (Exit) 118/72    Heart Rate (Admit) 74 bpm    Heart Rate (Exercise) 85 bpm    Heart Rate (Exit) 75 bpm    Rating of Perceived Exertion (Exercise) 11    Symptoms None    Comments Pt's first day in the CRP2 program    Duration Continue with 30 min of aerobic exercise without signs/symptoms of physical distress.    Intensity THRR unchanged      Progression   Progression Continue to progress workloads to maintain intensity without signs/symptoms of physical distress.    Average METs 2.4      Resistance Training   Training Prescription Yes    Weight 4 lbs    Reps 10-15    Time 10 Minutes      Interval Training   Interval Training No      Bike   Watts 19.5    Minutes 15    METs 2.59      Recumbant Elliptical   Level 1    RPM 54    Watts 64    Minutes 15    METs 2.2  Nutrition:  Target Goals: Understanding of nutrition guidelines, daily intake of sodium 1500mg , cholesterol 200mg , calories 30% from fat and 7% or less from saturated fats, daily to have 5 or more servings of fruits and vegetables.  Biometrics:  Pre Biometrics - 04/30/23 0857       Pre Biometrics   Waist Circumference 44 inches    Hip Circumference 45 inches    Waist to Hip Ratio 0.98 %    Triceps Skinfold 18 mm    % Body Fat 30.8 %    Grip Strength 44 kg    Flexibility 14 in     Single Leg Stand 30 seconds              Nutrition Therapy Plan and Nutrition Goals:  Nutrition Therapy & Goals - 05/03/23 1020       Nutrition Therapy   Diet Heart Healthy Diet    Drug/Food Interactions Statins/Certain Fruits      Personal Nutrition Goals   Nutrition Goal Patient to identify strategies for reducing cardiovascular risk by attending the Pritikin education and nutrition series weekly.    Personal Goal #2 Patient to improve diet quality by using the plate method as a guide for meal planning to include lean protein/plant protein, fruits, vegetables, whole grains, nonfat dairy as part of a well-balanced diet.    Personal Goal #3 Patient to identify strategies for weight loss of 0.5-2.0# per week.    Comments Amarie has medical history of STEMI, HTN, hyperlipidemia, CAD. He has previously completed cardiac rehab in the past. He reports using herbalife protein shake and tea for breakfast. LDL is not at goal <55; he continues zetia, crestor but will consider PCSK9-i. Patient will benefit from participation in intensive cardiac rehab for nutrition, exercise, and lifestyle modification.      Intervention Plan   Intervention Prescribe, educate and counsel regarding individualized specific dietary modifications aiming towards targeted core components such as weight, hypertension, lipid management, diabetes, heart failure and other comorbidities.;Nutrition handout(s) given to patient.    Expected Outcomes Short Term Goal: Understand basic principles of dietary content, such as calories, fat, sodium, cholesterol and nutrients.;Long Term Goal: Adherence to prescribed nutrition plan.             Nutrition Assessments:  MEDIFICTS Score Key: >=70 Need to make dietary changes  40-70 Heart Healthy Diet <= 40 Therapeutic Level Cholesterol Diet    Picture Your Plate Scores: <13 Unhealthy dietary pattern with much room for improvement. 41-50 Dietary pattern unlikely to meet  recommendations for good health and room for improvement. 51-60 More healthful dietary pattern, with some room for improvement.  >60 Healthy dietary pattern, although there may be some specific behaviors that could be improved.    Nutrition Goals Re-Evaluation:  Nutrition Goals Re-Evaluation     Row Name 05/03/23 1020             Goals   Current Weight 227 lb 4.7 oz (103.1 kg)       Comment LDL 68, HDL 35, A1c 5.7, Lpa 100.5       Expected Outcome Kalai has medical history of STEMI, HTN, hyperlipidemia, CAD. He has previously completed cardiac rehab in the past. He reports using herbalife protein shake and tea for breakfast. LDL is not at goal <55; he continues zetia, crestor but will consider PCSK9-i. Patient will benefit from participation in intensive cardiac rehab for nutrition, exercise, and lifestyle modification.  Nutrition Goals Re-Evaluation:  Nutrition Goals Re-Evaluation     Row Name 05/03/23 1020             Goals   Current Weight 227 lb 4.7 oz (103.1 kg)       Comment LDL 68, HDL 35, A1c 5.7, Lpa 100.5       Expected Outcome Zadyn has medical history of STEMI, HTN, hyperlipidemia, CAD. He has previously completed cardiac rehab in the past. He reports using herbalife protein shake and tea for breakfast. LDL is not at goal <55; he continues zetia, crestor but will consider PCSK9-i. Patient will benefit from participation in intensive cardiac rehab for nutrition, exercise, and lifestyle modification.                Nutrition Goals Discharge (Final Nutrition Goals Re-Evaluation):  Nutrition Goals Re-Evaluation - 05/03/23 1020       Goals   Current Weight 227 lb 4.7 oz (103.1 kg)    Comment LDL 68, HDL 35, A1c 5.7, Lpa 100.5    Expected Outcome Azarias has medical history of STEMI, HTN, hyperlipidemia, CAD. He has previously completed cardiac rehab in the past. He reports using herbalife protein shake and tea for breakfast. LDL is not at  goal <55; he continues zetia, crestor but will consider PCSK9-i. Patient will benefit from participation in intensive cardiac rehab for nutrition, exercise, and lifestyle modification.             Psychosocial: Target Goals: Acknowledge presence or absence of significant depression and/or stress, maximize coping skills, provide positive support system. Participant is able to verbalize types and ability to use techniques and skills needed for reducing stress and depression.  Initial Review & Psychosocial Screening:  Initial Psych Review & Screening - 04/30/23 0902       Initial Review   Current issues with None Identified      Family Dynamics   Good Support System? Yes   Wife for support   Comments Bodin denies any feelings of depression/anxiety/stress. He shared that he often has to get up several times a night to use the restroom and that is disruptive to his sleep, however his medications he takes to help him sleep at night are working well for him. He denies any need for additional resources at this time.      Barriers   Psychosocial barriers to participate in program There are no identifiable barriers or psychosocial needs.      Screening Interventions   Interventions Encouraged to exercise;Provide feedback about the scores to participant    Expected Outcomes Short Term goal: Identification and review with participant of any Quality of Life or Depression concerns found by scoring the questionnaire.;Long Term goal: The participant improves quality of Life and PHQ9 Scores as seen by post scores and/or verbalization of changes;Long Term Goal: Stressors or current issues are controlled or eliminated.             Quality of Life Scores:  Quality of Life - 04/30/23 1024       Quality of Life   Select Quality of Life      Quality of Life Scores   Health/Function Pre 23.86 %    Socioeconomic Pre 23.14 %    Psych/Spiritual Pre 24.86 %    Family Pre 24.6 %    GLOBAL Pre  24.03 %            Scores of 19 and below usually indicate a poorer quality of life in these  areas.  A difference of  2-3 points is a clinically meaningful difference.  A difference of 2-3 points in the total score of the Quality of Life Index has been associated with significant improvement in overall quality of life, self-image, physical symptoms, and general health in studies assessing change in quality of life.  PHQ-9: Review Flowsheet       04/30/2023 07/24/2013 04/24/2013  Depression screen PHQ 2/9  Decreased Interest 0 0 0  Down, Depressed, Hopeless 0 0 0  PHQ - 2 Score 0 0 0  Altered sleeping 0 - -  Tired, decreased energy 0 - -  Change in appetite 0 - -  Feeling bad or failure about yourself  0 - -  Trouble concentrating 0 - -  Moving slowly or fidgety/restless 0 - -  Suicidal thoughts 0 - -  PHQ-9 Score 0 - -   Interpretation of Total Score  Total Score Depression Severity:  1-4 = Minimal depression, 5-9 = Mild depression, 10-14 = Moderate depression, 15-19 = Moderately severe depression, 20-27 = Severe depression   Psychosocial Evaluation and Intervention:   Psychosocial Re-Evaluation:  Psychosocial Re-Evaluation     Row Name 05/03/23 [phone]             Psychosocial Re-Evaluation   Current issues with None Identified       Interventions Encouraged to attend Cardiac Rehabilitation for the exercise       Continue Psychosocial Services  No Follow up required                Psychosocial Discharge (Final Psychosocial Re-Evaluation):  Psychosocial Re-Evaluation - 05/03/23 0955       Psychosocial Re-Evaluation   Current issues with None Identified    Interventions Encouraged to attend Cardiac Rehabilitation for the exercise    Continue Psychosocial Services  No Follow up required             Vocational Rehabilitation: Provide vocational rehab assistance to qualifying candidates.   Vocational Rehab Evaluation & Intervention:  Vocational Rehab -  04/30/23 0904       Initial Vocational Rehab Evaluation & Intervention   Assessment shows need for Vocational Rehabilitation No   Masato is retired.            Education: Education Goals: Education classes will be provided on a weekly basis, covering required topics. Participant will state understanding/return demonstration of topics presented.    Education     Row Name 05/03/23 0900     Education   Cardiac Education Topics Pritikin   Select Core Videos     Core Videos   Educator Exercise Physiologist   Select Exercise Education   Exercise Education Biomechanial Limitations   Instruction Review Code 1- Verbalizes Understanding   Class Start Time [phone]   Class Stop Time 0841   Class Time Calculation (min) 33 min            Core Videos: Exercise    Move It!  Clinical staff conducted group or individual video education with verbal and written material and guidebook.  Patient learns the recommended Pritikin exercise program. Exercise with the goal of living a long, healthy life. Some of the health benefits of exercise include controlled diabetes, healthier blood pressure levels, improved cholesterol levels, improved heart and lung capacity, improved sleep, and better body composition. Everyone should speak with their doctor before starting or changing an exercise routine.  Biomechanical Limitations Clinical staff conducted group or individual video education with verbal and written  material and guidebook.  Patient learns how biomechanical limitations can impact exercise and how we can mitigate and possibly overcome limitations to have an impactful and balanced exercise routine.  Body Composition Clinical staff conducted group or individual video education with verbal and written material and guidebook.  Patient learns that body composition (ratio of muscle mass to fat mass) is a key component to assessing overall fitness, rather than body weight alone. Increased fat  mass, especially visceral belly fat, can put [place] at increased risk for metabolic syndrome, type 2 diabetes, heart disease, and even death. It is recommended to combine diet and exercise (cardiovascular and resistance training) to improve your body composition. Seek guidance from your physician and exercise physiologist before implementing an exercise routine.  Exercise Action Plan Clinical staff conducted group or individual video education with verbal and written material and guidebook.  Patient learns the recommended strategies to achieve and enjoy long-term exercise adherence, including variety, self-motivation, self-efficacy, and positive decision making. Benefits of exercise include fitness, good health, weight management, more energy, better sleep, less stress, and overall well-being.  Medical   Heart Disease Risk Reduction Clinical staff conducted group or individual video education with verbal and written material and guidebook.  Patient learns our heart is our most vital organ as it circulates oxygen, nutrients, white blood cells, and hormones throughout the entire body, and carries waste away. Data supports a plant-based eating plan like the Pritikin Program for its effectiveness in slowing progression of and reversing heart disease. The video provides a number of recommendations to address heart disease.   Metabolic Syndrome and Belly Fat  Clinical staff conducted group or individual video education with verbal and written material and guidebook.  Patient learns what metabolic syndrome is, how it leads to heart disease, and how one can reverse it and keep it from coming back. You have metabolic syndrome if you have 3 of the following 5 criteria: abdominal obesity, high blood pressure, high triglycerides, low HDL cholesterol, and high blood sugar.  Hypertension and Heart Disease Clinical staff conducted group or individual video education with verbal and written material and guidebook.   Patient learns that high blood pressure, or hypertension, is very common in the [place]. Hypertension is largely due to excessive salt intake, but other important risk factors include being overweight, physical inactivity, drinking too much alcohol, smoking, and not eating enough potassium from fruits and vegetables. High blood pressure is a leading risk factor for heart attack, stroke, congestive heart failure, dementia, kidney failure, and premature death. Long-term effects of excessive salt intake include stiffening of the arteries and thickening of heart muscle and organ damage. Recommendations include ways to reduce hypertension and the risk of heart disease.  Diseases of Our Time - Focusing on Diabetes Clinical staff conducted group or individual video education with verbal and written material and guidebook.  Patient learns why the best way to stop diseases of our time is prevention, through food and other lifestyle changes. Medicine (such as prescription pills and surgeries) is often only a Band-Aid on the problem, not a long-term solution. Most common diseases of our time include obesity, type 2 diabetes, hypertension, heart disease, and cancer. The Pritikin Program is recommended and has been proven to help reduce, reverse, and/or prevent the damaging effects of metabolic syndrome.  Nutrition   Overview of the Pritikin Eating Plan  Clinical staff conducted group or individual video education with verbal and written material and guidebook.  Patient learns about the Pritikin Eating Plan  for disease risk reduction. The Pritikin Eating Plan emphasizes a wide variety of unrefined, minimally-processed carbohydrates, like fruits, vegetables, whole grains, and legumes. Go, Caution, and Stop food choices are explained. Plant-based and lean animal proteins are emphasized. Rationale provided for low sodium intake for blood pressure control, low added sugars for blood sugar stabilization, and low  added fats and oils for coronary artery disease risk reduction and weight management.  Calorie Density  Clinical staff conducted group or individual video education with verbal and written material and guidebook.  Patient learns about calorie density and how it impacts the Pritikin Eating Plan. Knowing the characteristics of the food you choose will help you decide whether those foods will lead to weight gain or weight loss, and whether you want to consume more or less of them. Weight loss is usually a side effect of the Pritikin Eating Plan because of its focus on low calorie-dense foods.  Label Reading  Clinical staff conducted group or individual video education with verbal and written material and guidebook.  Patient learns about the Pritikin recommended label reading guidelines and corresponding recommendations regarding calorie density, added sugars, sodium content, and whole grains.  Dining Out - Part 1  Clinical staff conducted group or individual video education with verbal and written material and guidebook.  Patient learns that restaurant meals can be sabotaging because they can be so high in calories, fat, sodium, and/or sugar. Patient learns recommended strategies on how to positively address this and avoid unhealthy pitfalls.  Facts on Fats  Clinical staff conducted group or individual video education with verbal and written material and guidebook.  Patient learns that lifestyle modifications can be just as effective, if not more so, as many medications for lowering your risk of heart disease. A Pritikin lifestyle can help to reduce your risk of inflammation and atherosclerosis (cholesterol build-up, or plaque, in the artery walls). Lifestyle interventions such as dietary choices and physical activity address the cause of atherosclerosis. A review of the types of fats and their impact on blood cholesterol levels, along with dietary recommendations to reduce fat intake is also  included.  Nutrition Action Plan  Clinical staff conducted group or individual video education with verbal and written material and guidebook.  Patient learns how to incorporate Pritikin recommendations into their lifestyle. Recommendations include planning and keeping personal health goals in mind as an important part of their success.  Healthy Mind-Set    Healthy Minds, Bodies, Hearts  Clinical staff conducted group or individual video education with verbal and written material and guidebook.  Patient learns how to identify when they are stressed. Video will discuss the impact of that stress, as well as the many benefits of stress management. Patient will also be introduced to stress management techniques. The way we think, act, and feel has an impact on our hearts.  How Our Thoughts Can Heal Our Hearts  Clinical staff conducted group or individual video education with verbal and written material and guidebook.  Patient learns that negative thoughts can cause depression and anxiety. This can result in negative lifestyle behavior and serious health problems. Cognitive behavioral therapy is an effective method to help control our thoughts in order to change and improve our emotional outlook.  Additional Videos:  Exercise    Improving Performance  Clinical staff conducted group or individual video education with verbal and written material and guidebook.  Patient learns to use a non-linear approach by alternating intensity levels and lengths of time spent exercising to help burn  more calories and lose more body fat. Cardiovascular exercise helps improve heart health, metabolism, hormonal balance, blood sugar control, and recovery from fatigue. Resistance training improves strength, endurance, balance, coordination, reaction time, metabolism, and muscle mass. Flexibility exercise improves circulation, posture, and balance. Seek guidance from your physician and exercise physiologist before  implementing an exercise routine and learn your capabilities and proper form for all exercise.  Introduction to Yoga  Clinical staff conducted group or individual video education with verbal and written material and guidebook.  Patient learns about yoga, a discipline of the coming together of mind, breath, and body. The benefits of yoga include improved flexibility, improved range of motion, better posture and core strength, increased lung function, weight loss, and positive self-image. Yoga's heart health benefits include lowered blood pressure, healthier heart rate, decreased cholesterol and triglyceride levels, improved immune function, and reduced stress. Seek guidance from your physician and exercise physiologist before implementing an exercise routine and learn your capabilities and proper form for all exercise.  Medical   Aging: Enhancing Your Quality of Life  Clinical staff conducted group or individual video education with verbal and written material and guidebook.  Patient learns key strategies and recommendations to stay in good physical health and enhance quality of life, such as prevention strategies, having an advocate, securing a Health Care Proxy and Power of Attorney, and keeping a list of medications and system for tracking them. It also discusses how to avoid risk for bone loss.  Biology of Weight Control  Clinical staff conducted group or individual video education with verbal and written material and guidebook.  Patient learns that weight gain occurs because we consume more calories than we burn (eating more, moving less). Even if your body weight is normal, you may have higher ratios of fat compared to muscle mass. Too much body fat puts you at increased risk for cardiovascular disease, heart attack, stroke, type 2 diabetes, and obesity-related cancers. In addition to exercise, following the Pritikin Eating Plan can help reduce your risk.  Decoding Lab Results  Clinical staff  conducted group or individual video education with verbal and written material and guidebook.  Patient learns that lab test reflects one measurement whose values change over time and are influenced by many factors, including medication, stress, sleep, exercise, food, hydration, pre-existing medical conditions, and more. It is recommended to use the knowledge from this video to become more involved with your lab results and evaluate your numbers to speak with your doctor.   Diseases of Our Time - Overview  Clinical staff conducted group or individual video education with verbal and written material and guidebook.  Patient learns that according to the CDC, 50% to 70% of chronic diseases (such as obesity, type 2 diabetes, elevated lipids, hypertension, and heart disease) are avoidable through lifestyle improvements including healthier food choices, listening to satiety cues, and increased physical activity.  Sleep Disorders Clinical staff conducted group or individual video education with verbal and written material and guidebook.  Patient learns how good quality and duration of sleep are important to overall health and well-being. Patient also learns about sleep disorders and how they impact health along with recommendations to address them, including discussing with a physician.  Nutrition  Dining Out - Part 2 Clinical staff conducted group or individual video education with verbal and written material and guidebook.  Patient learns how to plan ahead and communicate in order to maximize their dining experience in a healthy and nutritious manner. Included are recommended food choices  based on the type of restaurant the patient is visiting.   Fueling a Banker conducted group or individual video education with verbal and written material and guidebook.  There is a strong connection between our food choices and our health. Diseases like obesity and type 2 diabetes are very  prevalent and are in large-part due to lifestyle choices. The Pritikin Eating Plan provides plenty of food and hunger-curbing satisfaction. It is easy to follow, affordable, and helps reduce health risks.  Menu Workshop  Clinical staff conducted group or individual video education with verbal and written material and guidebook.  Patient learns that restaurant meals can sabotage health goals because they are often packed with calories, fat, sodium, and sugar. Recommendations include strategies to plan ahead and to communicate with the manager, chef, or server to help order a healthier meal.  Planning Your Eating Strategy  Clinical staff conducted group or individual video education with verbal and written material and guidebook.  Patient learns about the Pritikin Eating Plan and its benefit of reducing the risk of disease. The Pritikin Eating Plan does not focus on calories. Instead, it emphasizes high-quality, nutrient-rich foods. By knowing the characteristics of the foods, we choose, we can determine their calorie density and make informed decisions.  Targeting Your Nutrition Priorities  Clinical staff conducted group or individual video education with verbal and written material and guidebook.  Patient learns that lifestyle habits have a tremendous impact on disease risk and progression. This video provides eating and physical activity recommendations based on your personal health goals, such as reducing LDL cholesterol, losing weight, preventing or controlling type 2 diabetes, and reducing high blood pressure.  Vitamins and Minerals  Clinical staff conducted group or individual video education with verbal and written material and guidebook.  Patient learns different ways to obtain key vitamins and minerals, including through a recommended healthy diet. It is important to discuss all supplements you take with your doctor.   Healthy Mind-Set    Smoking Cessation  Clinical staff conducted group  or individual video education with verbal and written material and guidebook.  Patient learns that cigarette smoking and tobacco addiction pose a serious health risk which affects millions of people. Stopping smoking will significantly reduce the risk of heart disease, lung disease, and many forms of cancer. Recommended strategies for quitting are covered, including working with your doctor to develop a successful plan.  Culinary   Becoming a Set designer conducted group or individual video education with verbal and written material and guidebook.  Patient learns that cooking at home can be healthy, cost-effective, quick, and puts them in control. Keys to cooking healthy recipes will include looking at your recipe, assessing your equipment needs, planning ahead, making it simple, choosing cost-effective seasonal ingredients, and limiting the use of added fats, salts, and sugars.  Cooking - Breakfast and Snacks  Clinical staff conducted group or individual video education with verbal and written material and guidebook.  Patient learns how important breakfast is to satiety and nutrition through the entire day. Recommendations include key foods to eat during breakfast to help stabilize blood sugar levels and to prevent overeating at meals later in the day. Planning ahead is also a key component.  Cooking - Educational psychologist conducted group or individual video education with verbal and written material and guidebook.  Patient learns eating strategies to improve overall health, including an approach to cook more at home. Recommendations include thinking of  animal protein as a side on your plate rather than center stage and focusing instead on lower calorie dense options like vegetables, fruits, whole grains, and plant-based proteins, such as beans. Making sauces in large quantities to freeze for later and leaving the skin on your vegetables are also recommended to maximize  your experience.  Cooking - Healthy Salads and Dressing Clinical staff conducted group or individual video education with verbal and written material and guidebook.  Patient learns that vegetables, fruits, whole grains, and legumes are the foundations of the Pritikin Eating Plan. Recommendations include how to incorporate each of these in flavorful and healthy salads, and how to create homemade salad dressings. Proper handling of ingredients is also covered. Cooking - Soups and [employer] - Soups and Desserts Clinical staff conducted group or individual video education with verbal and written material and guidebook.  Patient learns that Pritikin soups and desserts make for easy, nutritious, and delicious snacks and meal components that are low in sodium, fat, sugar, and calorie density, while high in vitamins, minerals, and filling fiber. Recommendations include simple and healthy ideas for soups and desserts.   Overview     The Pritikin Solution Program Overview Clinical staff conducted group or individual video education with verbal and written material and guidebook.  Patient learns that the results of the Pritikin Program have been documented in more than 100 articles published in peer-reviewed journals, and the benefits include reducing risk factors for (and, in some cases, even reversing) high cholesterol, high blood pressure, type 2 diabetes, obesity, and more! An overview of the three key pillars of the Pritikin Program will be covered: eating well, doing regular exercise, and having a healthy mind-set.  WORKSHOPS  Exercise: Exercise Basics: Building Your Action Plan Clinical staff led group instruction and group discussion with PowerPoint presentation and patient guidebook. To enhance the learning environment the use of posters, models and videos may be added. At the conclusion of this workshop, patients will comprehend the difference between physical activity and exercise, as well  as the benefits of incorporating both, into their routine. Patients will understand the FITT (Frequency, Intensity, Time, and Type) principle and how to use it to build an exercise action plan. In addition, safety concerns and other considerations for exercise and cardiac rehab will be addressed by the presenter. The purpose of this lesson is to promote a comprehensive and effective weekly exercise routine in order to improve patients' overall level of fitness.   Managing Heart Disease: Your Path to a Healthier Heart Clinical staff led group instruction and group discussion with PowerPoint presentation and patient guidebook. To enhance the learning environment the use of posters, models and videos may be added.At the conclusion of this workshop, patients will understand the anatomy and physiology of the heart. Additionally, they will understand how Pritikin's three pillars impact the risk factors, the progression, and the management of heart disease.  The purpose of this lesson is to provide a high-level overview of the heart, heart disease, and how the Pritikin lifestyle positively impacts risk factors.  Exercise Biomechanics Clinical staff led group instruction and group discussion with PowerPoint presentation and patient guidebook. To enhance the learning environment the use of posters, models and videos may be added. Patients will learn how the structural parts of their bodies function and how these functions impact their daily activities, movement, and exercise. Patients will learn how to promote a neutral spine, learn how to manage pain, and identify ways to improve their physical movement  in order to promote healthy living. The purpose of this lesson is to expose patients to common physical limitations that impact physical activity. Participants will learn practical ways to adapt and manage aches and pains, and to minimize their effect on regular exercise. Patients will learn how to  maintain good posture while sitting, walking, and lifting.  Balance Training and Fall Prevention  Clinical staff led group instruction and group discussion with PowerPoint presentation and patient guidebook. To enhance the learning environment the use of posters, models and videos may be added. At the conclusion of this workshop, patients will understand the importance of their sensorimotor skills (vision, proprioception, and the vestibular system) in maintaining their ability to balance as they age. Patients will apply a variety of balancing exercises that are appropriate for their current level of function. Patients will understand the common causes for poor balance, possible solutions to these problems, and ways to modify their physical environment in order to minimize their fall risk. The purpose of this lesson is to teach patients about the importance of maintaining balance as they age and ways to minimize their risk of falling.  WORKSHOPS   Nutrition:  Fueling a Ship broker led group instruction and group discussion with PowerPoint presentation and patient guidebook. To enhance the learning environment the use of posters, models and videos may be added. Patients will review the foundational principles of the Pritikin Eating Plan and understand what constitutes a serving size in each of the food groups. Patients will also learn Pritikin-friendly foods that are better choices when away from home and review make-ahead meal and snack options. Calorie density will be reviewed and applied to three nutrition priorities: weight maintenance, weight loss, and weight gain. The purpose of this lesson is to reinforce (in a group setting) the key concepts around what patients are recommended to eat and how to apply these guidelines when away from home by planning and selecting Pritikin-friendly options. Patients will understand how calorie density may be adjusted for different weight management  goals.  Mindful Eating  Clinical staff led group instruction and group discussion with PowerPoint presentation and patient guidebook. To enhance the learning environment the use of posters, models and videos may be added. Patients will briefly review the concepts of the Pritikin Eating Plan and the importance of low-calorie dense foods. The concept of mindful eating will be introduced as well as the importance of paying attention to internal hunger signals. Triggers for non-hunger eating and techniques for dealing with triggers will be explored. The purpose of this lesson is to provide patients with the opportunity to review the basic principles of the Pritikin Eating Plan, discuss the value of eating mindfully and how to measure internal cues of hunger and fullness using the Hunger Scale. Patients will also discuss reasons for non-hunger eating and learn strategies to use for controlling emotional eating.  Targeting Your Nutrition Priorities Clinical staff led group instruction and group discussion with PowerPoint presentation and patient guidebook. To enhance the learning environment the use of posters, models and videos may be added. Patients will learn how to determine their genetic susceptibility to disease by reviewing their family history. Patients will gain insight into the importance of diet as part of an overall healthy lifestyle in mitigating the impact of genetics and other environmental insults. The purpose of this lesson is to provide patients with the opportunity to assess their personal nutrition priorities by looking at their family history, their own health history and current risk factors.  Patients will also be able to discuss ways of prioritizing and modifying the Pritikin Eating Plan for their highest risk areas  Menu  Clinical staff led group instruction and group discussion with PowerPoint presentation and patient guidebook. To enhance the learning environment the use of posters,  models and videos may be added. Using menus brought in from [employer], or printed from [employer], patients will apply the Pritikin dining out guidelines that were presented in the [employer] video. Patients will also be able to practice these guidelines in a variety of provided scenarios. The purpose of this lesson is to provide patients with the opportunity to practice hands-on learning of the Pritikin Dining Out guidelines with actual menus and practice scenarios.  Label Reading Clinical staff led group instruction and group discussion with PowerPoint presentation and patient guidebook. To enhance the learning environment the use of posters, models and videos may be added. Patients will review and discuss the Pritikin label reading guidelines presented in Pritikin's Label Reading Educational series video. Using fool labels brought in from local grocery stores and markets, patients will apply the label reading guidelines and determine if the packaged food meet the Pritikin guidelines. The purpose of this lesson is to provide patients with the opportunity to review, discuss, and practice hands-on learning of the Pritikin Label Reading guidelines with actual packaged food labels. Cooking School  Pritikin's [employer] are designed to teach patients ways to prepare quick, simple, and affordable recipes at home. The importance of nutrition's role in chronic disease risk reduction is reflected in its emphasis in the overall Pritikin program. By learning how to prepare essential core Pritikin Eating Plan recipes, patients will increase control over what they eat; be able to customize the flavor of foods without the use of added salt, sugar, or fat; and improve the quality of the food they consume. By learning a set of core recipes which are easily assembled, quickly prepared, and affordable, patients are more likely to prepare more healthy foods at home. These workshops  focus on convenient breakfasts, simple entres, side dishes, and desserts which can be prepared with minimal effort and are consistent with nutrition recommendations for cardiovascular risk reduction. Cooking [employer] are taught by a Armed forces logistics/support/administrative officer (RD) who has been trained by the [employer]. The chef or RD has a clear understanding of the importance of minimizing - if not completely eliminating - added fat, sugar, and sodium in recipes. Throughout the series of Cooking School Workshop sessions, patients will learn about healthy ingredients and efficient methods of cooking to build confidence in their capability to prepare    Cooking School weekly topics:  Adding Flavor- Sodium-Free  Fast and Healthy Breakfasts  Powerhouse Plant-Based Proteins  Satisfying Salads and Dressings  Simple Sides and Sauces  International Cuisine-Spotlight on the [employer] Zones  Delicious Desserts  Savory Soups  [employer] - Meals in a Astronomer Appetizers and Snacks  Comforting Weekend Breakfasts  One-Pot Wonders   Fast Evening Meals  Landscape architect Your Pritikin Plate  WORKSHOPS   Healthy Mindset (Psychosocial):  Focused Goals, Sustainable Changes Clinical staff led group instruction and group discussion with PowerPoint presentation and patient guidebook. To enhance the learning environment the use of posters, models and videos may be added. Patients will be able to apply effective goal setting strategies to establish at least one personal goal, and then take consistent, meaningful action toward that goal. They will learn to  identify common barriers to achieving personal goals and develop strategies to overcome them. Patients will also gain an understanding of how our mind-set can impact our ability to achieve goals and the importance of cultivating a positive and growth-oriented mind-set. The purpose of this lesson is to provide patients with a deeper  understanding of how to set and achieve personal goals, as well as the tools and strategies needed to overcome common obstacles which may arise along the way.  From Head to Heart: The Power of a Healthy Outlook  Clinical staff led group instruction and group discussion with PowerPoint presentation and patient guidebook. To enhance the learning environment the use of posters, models and videos may be added. Patients will be able to recognize and describe the impact of emotions and mood on physical health. They will discover the importance of self-care and explore self-care practices which may work for them. Patients will also learn how to utilize the 4 C's to cultivate a healthier outlook and better manage stress and challenges. The purpose of this lesson is to demonstrate to patients how a healthy outlook is an essential part of maintaining good health, especially as they continue their cardiac rehab journey.  Healthy Sleep for a Healthy Heart Clinical staff led group instruction and group discussion with PowerPoint presentation and patient guidebook. To enhance the learning environment the use of posters, models and videos may be added. At the conclusion of this workshop, patients will be able to demonstrate knowledge of the importance of sleep to overall health, well-being, and quality of life. They will understand the symptoms of, and treatments for, common sleep disorders. Patients will also be able to identify daytime and nighttime behaviors which impact sleep, and they will be able to apply these tools to help manage sleep-related challenges. The purpose of this lesson is to provide patients with a general overview of sleep and outline the importance of quality sleep. Patients will learn about a few of the most common sleep disorders. Patients will also be introduced to the concept of "sleep hygiene," and discover ways to self-manage certain sleeping problems through simple daily behavior changes.  Finally, the workshop will motivate patients by clarifying the links between quality sleep and their goals of heart-healthy living.   Recognizing and Reducing Stress Clinical staff led group instruction and group discussion with PowerPoint presentation and patient guidebook. To enhance the learning environment the use of posters, models and videos may be added. At the conclusion of this workshop, patients will be able to understand the types of stress reactions, differentiate between acute and chronic stress, and recognize the impact that chronic stress has on their health. They will also be able to apply different coping mechanisms, such as reframing negative self-talk. Patients will have the opportunity to practice a variety of stress management techniques, such as deep abdominal breathing, progressive muscle relaxation, and/or guided imagery.  The purpose of this lesson is to educate patients on the role of stress in their lives and to provide healthy techniques for coping with it.  Learning Barriers/Preferences:  Learning Barriers/Preferences - 04/30/23 1610       Learning Barriers/Preferences   Learning Barriers Sight   glasses   Learning Preferences Audio;Computer/Internet;Group Instruction;Individual Instruction;Skilled Demonstration;Verbal Instruction;Video;Written Material;Pictoral             Education Topics:  Knowledge Questionnaire Score:  Knowledge Questionnaire Score - 04/30/23 0906       Knowledge Questionnaire Score   Pre Score 19/24  Core Components/Risk Factors/Patient Goals at Admission:  Personal Goals and Risk Factors at Admission - 04/30/23 0905       Core Components/Risk Factors/Patient Goals on Admission    Weight Management Yes;Obesity;Weight Loss    Intervention Weight Management: Develop a combined nutrition and exercise program designed to reach desired caloric intake, while maintaining appropriate intake of nutrient and fiber, sodium  and fats, and appropriate energy expenditure required for the weight goal.;Weight Management: Provide education and appropriate resources to help participant work on and attain dietary goals.;Weight Management/Obesity: Establish reasonable short term and long term weight goals.;Obesity: Provide education and appropriate resources to help participant work on and attain dietary goals.    Admit Weight 225 lb (102.1 kg)    Goal Weight: Long Term 205 lb (93 kg)   pt goal   Hypertension Yes    Intervention Provide education on lifestyle modifcations including regular physical activity/exercise, weight management, moderate sodium restriction and increased consumption of fresh fruit, vegetables, and low fat dairy, alcohol moderation, and smoking cessation.;Monitor prescription use compliance.    Expected Outcomes Short Term: Continued assessment and intervention until BP is < 140/30mm HG in hypertensive participants. < 130/44mm HG in hypertensive participants with diabetes, heart failure or chronic kidney disease.;Long Term: Maintenance of blood pressure at goal levels.    Lipids Yes    Intervention Provide education and support for participant on nutrition & aerobic/resistive exercise along with prescribed medications to achieve LDL 70mg , HDL >40mg .    Expected Outcomes Long Term: Cholesterol controlled with medications as prescribed, with individualized exercise RX and with personalized nutrition plan. Value goals: LDL < 70mg , HDL > 40 mg.;Short Term: Participant states understanding of desired cholesterol values and is compliant with medications prescribed. Participant is following exercise prescription and nutrition guidelines.             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  Introduction to pr[facility]ikin education/intensive cardiac rehab. Initial orientation packet reviewed with patient. 30 Day ITP Review. Jonael started cardiac rehab on 05/03/23. Kapono did well with exercise.               Comments: Pt started cardiac rehab today.  Pt tolerated light exercise without difficulty. VSS, telemetry-Sinus Rhythm, asymptomatic.  Medication list reconciled. Pt denies barriers to medicaiton compliance.  PSYCHOSOCIAL ASSESSMENT:  PHQ-0. Pt exhibits positive coping skills, hopeful outlook with supportive family. No psychosocial needs identified at this time, no psychosocial interventions necessary.    Pt enjoys golf, yard work, spending time outside, fishing, church activities and spending time with his children and grandchildren.   Pt oriented to exercise equipment and routine.    Understanding verbalized. Thayer Headings RN BSN

## 2023-05-05 ENCOUNTER — Encounter (HOSPITAL_COMMUNITY)
Admission: RE | Admit: 2023-05-05 | Discharge: 2023-05-05 | Disposition: A | Discharge status: Home / Self Care | Source: Ambulatory Visit | Attending: Cardiovascular Disease | Admitting: Cardiovascular Disease

## 2023-05-05 DIAGNOSIS — I2111 ST elevation (STEMI) myocardial infarction involving right coronary artery: Secondary | ICD-10-CM | POA: Insufficient documentation

## 2023-05-05 DIAGNOSIS — Z955 Presence of coronary angioplasty implant and graft: Secondary | ICD-10-CM | POA: Diagnosis not present

## 2023-05-07 ENCOUNTER — Encounter (HOSPITAL_COMMUNITY)
Admission: RE | Admit: 2023-05-07 | Discharge: 2023-05-07 | Disposition: A | Discharge status: Home / Self Care | Source: Ambulatory Visit | Attending: Cardiovascular Disease | Admitting: Cardiovascular Disease

## 2023-05-07 DIAGNOSIS — Z955 Presence of coronary angioplasty implant and graft: Secondary | ICD-10-CM

## 2023-05-07 DIAGNOSIS — I2111 ST elevation (STEMI) myocardial infarction involving right coronary artery: Secondary | ICD-10-CM | POA: Diagnosis not present

## 2023-05-10 ENCOUNTER — Encounter (HOSPITAL_COMMUNITY)
Admission: RE | Admit: 2023-05-10 | Discharge: 2023-05-10 | Disposition: A | Discharge status: Home / Self Care | Source: Ambulatory Visit | Attending: Cardiovascular Disease | Admitting: Cardiovascular Disease

## 2023-05-10 DIAGNOSIS — I2111 ST elevation (STEMI) myocardial infarction involving right coronary artery: Secondary | ICD-10-CM | POA: Diagnosis not present

## 2023-05-10 DIAGNOSIS — Z955 Presence of coronary angioplasty implant and graft: Secondary | ICD-10-CM

## 2023-05-12 ENCOUNTER — Encounter (HOSPITAL_COMMUNITY)
Admission: RE | Admit: 2023-05-12 | Discharge: 2023-05-12 | Disposition: A | Discharge status: Home / Self Care | Source: Ambulatory Visit | Attending: Cardiovascular Disease

## 2023-05-12 DIAGNOSIS — I2111 ST elevation (STEMI) myocardial infarction involving right coronary artery: Secondary | ICD-10-CM | POA: Diagnosis not present

## 2023-05-12 DIAGNOSIS — Z955 Presence of coronary angioplasty implant and graft: Secondary | ICD-10-CM

## 2023-05-14 ENCOUNTER — Encounter (HOSPITAL_COMMUNITY)
Admission: RE | Admit: 2023-05-14 | Discharge: 2023-05-14 | Disposition: A | Discharge status: Home / Self Care | Source: Ambulatory Visit | Attending: Cardiovascular Disease | Admitting: Cardiovascular Disease

## 2023-05-14 DIAGNOSIS — I2111 ST elevation (STEMI) myocardial infarction involving right coronary artery: Secondary | ICD-10-CM

## 2023-05-14 DIAGNOSIS — Z955 Presence of coronary angioplasty implant and graft: Secondary | ICD-10-CM | POA: Diagnosis not present

## 2023-05-17 ENCOUNTER — Encounter (HOSPITAL_COMMUNITY)
Admission: RE | Admit: 2023-05-17 | Discharge: 2023-05-17 | Disposition: A | Discharge status: Home / Self Care | Source: Ambulatory Visit | Attending: Cardiovascular Disease | Admitting: Cardiovascular Disease

## 2023-05-17 DIAGNOSIS — I2111 ST elevation (STEMI) myocardial infarction involving right coronary artery: Secondary | ICD-10-CM

## 2023-05-17 DIAGNOSIS — Z955 Presence of coronary angioplasty implant and graft: Secondary | ICD-10-CM

## 2023-05-19 ENCOUNTER — Encounter (HOSPITAL_COMMUNITY)

## 2023-05-20 DIAGNOSIS — M545 Low back pain, unspecified: Secondary | ICD-10-CM | POA: Diagnosis not present

## 2023-05-20 DIAGNOSIS — I252 Old myocardial infarction: Secondary | ICD-10-CM | POA: Diagnosis not present

## 2023-05-20 DIAGNOSIS — E785 Hyperlipidemia, unspecified: Secondary | ICD-10-CM | POA: Diagnosis not present

## 2023-05-20 DIAGNOSIS — N182 Chronic kidney disease, stage 2 (mild): Secondary | ICD-10-CM | POA: Diagnosis not present

## 2023-05-20 DIAGNOSIS — M1990 Unspecified osteoarthritis, unspecified site: Secondary | ICD-10-CM | POA: Diagnosis not present

## 2023-05-20 DIAGNOSIS — Z7982 Long term (current) use of aspirin: Secondary | ICD-10-CM | POA: Diagnosis not present

## 2023-05-20 DIAGNOSIS — D696 Thrombocytopenia, unspecified: Secondary | ICD-10-CM | POA: Diagnosis not present

## 2023-05-20 DIAGNOSIS — I129 Hypertensive chronic kidney disease with stage 1 through stage 4 chronic kidney disease, or unspecified chronic kidney disease: Secondary | ICD-10-CM | POA: Diagnosis not present

## 2023-05-20 DIAGNOSIS — I429 Cardiomyopathy, unspecified: Secondary | ICD-10-CM | POA: Diagnosis not present

## 2023-05-21 ENCOUNTER — Encounter (HOSPITAL_COMMUNITY)
Admission: RE | Admit: 2023-05-21 | Discharge: 2023-05-21 | Disposition: A | Discharge status: Home / Self Care | Source: Ambulatory Visit | Attending: Cardiovascular Disease | Admitting: Cardiovascular Disease

## 2023-05-21 DIAGNOSIS — I2111 ST elevation (STEMI) myocardial infarction involving right coronary artery: Secondary | ICD-10-CM | POA: Diagnosis not present

## 2023-05-21 DIAGNOSIS — Z955 Presence of coronary angioplasty implant and graft: Secondary | ICD-10-CM | POA: Diagnosis not present

## 2023-05-21 NOTE — Progress Notes (Signed)
 Reviewed home exercise Rx with patient today.  Encouraged warm-up, cool-down, and stretching. Reviewed THRR of 61-123 and keeping RPE between 11-13. Encouraged to hydrate with activity.  Reviewed weather parameters for temperature and humidity for safe exercise outdoors. Reviewed S/S to terminate exercise and when to call 911 vs MD. Reviewed the use of NTG and pt was encouraged to carry at all times. Pt encouraged to always carry a cell phone for safety when exercising outdoors. Pt verbalized understanding of the home exercise Rx and was provided a copy.  Alm Parkins MS, ACSM-CEP, CCRP

## 2023-05-24 ENCOUNTER — Encounter (HOSPITAL_COMMUNITY)
Admission: RE | Admit: 2023-05-24 | Discharge: 2023-05-24 | Disposition: A | Discharge status: Home / Self Care | Source: Ambulatory Visit | Attending: Cardiovascular Disease

## 2023-05-24 DIAGNOSIS — Z955 Presence of coronary angioplasty implant and graft: Secondary | ICD-10-CM

## 2023-05-24 DIAGNOSIS — I2111 ST elevation (STEMI) myocardial infarction involving right coronary artery: Secondary | ICD-10-CM | POA: Diagnosis not present

## 2023-05-24 NOTE — Progress Notes (Signed)
 Cardiac Individual Treatment Plan  Patient Details  Name: Mark Levine MRN: [id number] Date of Birth: 07/23/1955 Referring Provider:   Flowsheet Row INTENSIVE CARDIAC REHAB ORIENT from 04/30/2023 in [facility]  Referring Provider Antoinette Batman, MD       Initial Encounter Date:  Flowsheet Row INTENSIVE CARDIAC REHAB ORIENT from 04/30/2023 in [facility]  Date 04/30/23       Visit Diagnosis: 04/17/23 STEMI  04/17/23 DES RCA  Patient's Home Medications on Admission:  Current Outpatient Medications:    aspirin  EC 81 MG EC tablet, Take 1 tablet (81 mg total) by mouth daily., Disp: , Rfl:    atorvastatin  (LIPITOR ) 80 MG tablet, Take 1 tablet (80 mg total) by mouth daily., Disp: 90 tablet, Rfl: 3   ezetimibe  (ZETIA ) 10 MG tablet, Take 1 tablet (10 mg total) by mouth daily., Disp: 90 tablet, Rfl: 3   losartan  (COZAAR ) 50 MG tablet, Take 50 mg by mouth daily., Disp: , Rfl:    Melatonin Gummies 5 MG CHEW, Chew 5 mg by mouth as needed (insomnia)., Disp: , Rfl:    metoprolol  succinate (TOPROL -XL) 25 MG 24 hr tablet, Take 1 tablet (25 mg total) by mouth daily., Disp: 90 tablet, Rfl: 3   nitroGLYCERIN  (NITROSTAT ) 0.4 MG SL tablet, Place 1 tablet (0.4 mg total) under the tongue every 5 (five) minutes x 3 doses as needed for chest pain. Do not take within 72 hrs of viagra , Disp: 25 tablet, Rfl: 3   prasugrel  (EFFIENT ) 10 MG TABS tablet, Take 1 tablet (10 mg total) by mouth daily., Disp: 90 tablet, Rfl: 3   traZODone (DESYREL) 50 MG tablet, Take 1 tablet by mouth as needed., Disp: , Rfl:    VIAGRA  100 MG tablet, Take 1 tablet (100 mg total) by mouth daily as needed for erectile dysfunction. Do not take within 72 hrs of nitroglycerin , Disp: 10 tablet, Rfl: 1  Past Medical History: Past Medical History:  Diagnosis Date   Coronary Artery Disease 04/04/2013   Inf STEMI in  04/2013 s/p 3 x 16 mm DES to RCA     Stress MPI 02/15/15: apical lateral infarct, no ischemia, EF 48    Inf STEMI s/p 3 x 32 mm DES to dRCA in 04/2023      LHC 04/17/23: LM 30; LAD mid 40; RI 60; RCA prox 35, dist 100; EF 55-65     TTE 04/17/23: EF 50-55, NL RVSF, mild to mod MR, AV sclerosis, inf HK     Cough due to ACE inhibitor 05/11/2014   Lisinopril  discontinued 05/11/14    Erectile dysfunction 10/11/2013   Essential hypertension 05/10/2014   History of nuclear stress test    a.Myoview 1/17: EF 48%, apical and apical lateral scar, no ischemia; Intermediate Risk (low EF)   Hyperlipidemia    MI, 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   Inferior STEMI  04/02/13     Tobacco Use: Social History   Tobacco Use  Smoking Status Never  Smokeless Tobacco Never    Labs: Review Flowsheet  More data may exist      Latest Ref Rng & Units 04/02/2013 04/03/2013 05/05/2013 02/15/2015 04/17/2023  Labs for ITP Cardiac and Pulmonary Rehab  Cholestrol 0 - 200 mg/dL - 147  829  562  130   LDL (calc) 0 - 99 mg/dL - 865  52  62  68   HDL-C >40 mg/dL - 45  42  40  35   Trlycerides <150 mg/dL - 161  33  65  21   Hemoglobin A1c 4.8 - 5.6 % 5.7  - - - 5.7     Capillary Blood Glucose: No results found for: "GLUCAP"   Exercise Target Goals: Exercise Program Goal: Individual exercise prescription set using results from initial 6 min walk test and THRR while considering  patient's activity barriers and safety.   Exercise Prescription Goal: Initial exercise prescription builds to 30-45 minutes a day of aerobic activity, 2-3 days per week.  Home exercise guidelines will be given to patient during program as part of exercise prescription that the participant will acknowledge.  Activity Barriers & Risk Stratification:  Activity Barriers & Cardiac Risk Stratification - 04/30/23 0902       Activity Barriers & Cardiac Risk Stratification   Activity Barriers Left Knee Replacement    Cardiac Risk Stratification High   <5 METs on             6 Minute Walk:  6 Minute Walk     Row Name 04/30/23 1023         6 Minute Walk   Phase Initial     Distance 1756 feet     Walk Time 6 minutes     # of Rest Breaks 0     MPH 3.38     METS 3.81     RPE 11     Perceived Dyspnea  0     VO2 Peak 13.35     Symptoms Yes (comment)     Comments L knee stiffness, no pain or s/sx     Resting HR 76 bpm     Resting BP 118/72     Resting Oxygen Saturation  99 %     Exercise Oxygen Saturation  during 6 min walk 99 %     Max Ex. HR 95 bpm     Max Ex. BP 142/70     2 Minute Post BP 126/70              Oxygen Initial Assessment:   Oxygen Re-Evaluation:   Oxygen Discharge (Final Oxygen Re-Evaluation):   Initial Exercise Prescription:  Initial Exercise Prescription - 04/30/23 1000       Date of Initial Exercise RX and Referring Provider   Date 04/30/23    Referring Provider Antoinette Batman, MD    Expected Discharge Date 07/21/23      Bike   Level 1.5    Watts 40    Minutes 15    METs 2.5      NuStep   Level --    SPM --    Minutes --    METs --      Recumbant Elliptical   Level 2    RPM 60    Watts 20    Minutes 15    METs 2      Prescription Details   Frequency (times per week) 3    Duration Progress to 30 minutes of continuous aerobic without signs/symptoms of physical distress      Intensity   THRR 40-80% of Max Heartrate 61-123    Ratings of Perceived Exertion 11-13    Perceived Dyspnea 0-4      Progression   Progression Continue progressive overload as per policy without signs/symptoms or physical distress.      Resistance Training   Training Prescription Yes    Weight 4    Reps 10-15  Perform Capillary Blood Glucose checks as needed.  Exercise Prescription Changes:   Exercise Prescription Changes     Row Name 05/03/23 1100 05/21/23 1400           Response to Exercise   Blood Pressure (Admit) 112/62 122/66      Blood Pressure (Exercise) 128/78 128/60       Blood Pressure (Exit) 118/72 118/64      Heart Rate (Admit) 74 bpm 67 bpm      Heart Rate (Exercise) 85 bpm 103 bpm      Heart Rate (Exit) 75 bpm 64 bpm      Rating of Perceived Exertion (Exercise) 11 11      Symptoms None None      Comments Pt's first day in the CRP2 program Reviwed METs and Home exercise Rx      Duration Continue with 30 min of aerobic exercise without signs/symptoms of physical distress. Continue with 30 min of aerobic exercise without signs/symptoms of physical distress.      Intensity THRR unchanged THRR unchanged        Progression   Progression Continue to progress workloads to maintain intensity without signs/symptoms of physical distress. Continue to progress workloads to maintain intensity without signs/symptoms of physical distress.      Average METs 2.4 2.7        Resistance Training   Training Prescription Yes Yes      Weight 4 lbs 5 lbs      Reps 10-15 10-15      Time 10 Minutes 10 Minutes        Interval Training   Interval Training No No        Bike   Level -- 3      Watts 19.5 25      Minutes 15 15      METs 2.59 2.79        Recumbant Elliptical   Level 1 3      RPM 54 57      Watts 64 68      Minutes 15 15      METs 2.2 2.6        Home Exercise Plan   Plans to continue exercise at -- Home (comment)      Frequency -- Add 2 additional days to program exercise sessions.      Initial Home Exercises Provided -- 05/21/23               Exercise Comments:   Exercise Comments     Row Name 05/03/23 1133 05/21/23 1131         Exercise Comments Pt's first day in the CRP2 program. Pt completed session with no complaints. Reviewed METs and home exercise Rx today. Pt is exercising at home on his stationary bike for 30 minutes. Pt will continuw with this. Pt verbalized understnading of the home exercise Rx and was provided a copy.               Exercise Goals and Review:   Exercise Goals     Row Name 04/30/23 0902              Exercise Goals   Increase Physical Activity Yes       Intervention Provide advice, education, support and counseling about physical activity/exercise needs.;Develop an individualized exercise prescription for aerobic and resistive training based on initial evaluation findings, risk stratification, comorbidities and participant's personal goals.       Expected Outcomes  Short Term: Attend rehab on a regular basis to increase amount of physical activity.;Long Term: Exercising regularly at least 3-5 days a week.;Long Term: Add in home exercise to make exercise part of routine and to increase amount of physical activity.       Increase Strength and Stamina Yes       Intervention Provide advice, education, support and counseling about physical activity/exercise needs.;Develop an individualized exercise prescription for aerobic and resistive training based on initial evaluation findings, risk stratification, comorbidities and participant's personal goals.       Expected Outcomes Short Term: Increase workloads from initial exercise prescription for resistance, speed, and METs.;Long Term: Improve cardiorespiratory fitness, muscular endurance and strength as measured by increased METs and functional capacity ( );Short Term: Perform resistance training exercises routinely during rehab and add in resistance training at home       Able to understand and use rate of perceived exertion (RPE) scale Yes       Intervention Provide education and explanation on how to use RPE scale       Expected Outcomes Short Term: Able to use RPE daily in rehab to express subjective intensity level;Long Term:  Able to use RPE to guide intensity level when exercising independently       Knowledge and understanding of Target Heart Rate Range (THRR) Yes       Intervention Provide education and explanation of THRR including how the numbers were predicted and where they are located for reference       Expected Outcomes Short Term:  Able to state/look up THRR;Short Term: Able to use daily as guideline for intensity in rehab;Long Term: Able to use THRR to govern intensity when exercising independently       Understanding of Exercise Prescription Yes       Intervention Provide education, explanation, and written materials on patient's individual exercise prescription       Expected Outcomes Short Term: Able to explain program exercise prescription;Long Term: Able to explain home exercise prescription to exercise independently                Exercise Goals Re-Evaluation :  Exercise Goals Re-Evaluation     Row Name 05/03/23 1132             Exercise Goal Re-Evaluation   Exercise Goals Review Increase Physical Activity;Increase Strength and Stamina;Able to understand and use rate of perceived exertion (RPE) scale;Knowledge and understanding of Target Heart Rate Range (THRR);Understanding of Exercise Prescription       Comments Pt's first day in the CRP2 program. Pt understands the exercise Rx, RPE scale and THRR.       Expected Outcomes Will continue to monitor patient and progress exercise workloads as tolerated.                Discharge Exercise Prescription (Final Exercise Prescription Changes):  Exercise Prescription Changes - 05/21/23 1400       Response to Exercise   Blood Pressure (Admit) 122/66    Blood Pressure (Exercise) 128/60    Blood Pressure (Exit) 118/64    Heart Rate (Admit) 67 bpm    Heart Rate (Exercise) 103 bpm    Heart Rate (Exit) 64 bpm    Rating of Perceived Exertion (Exercise) 11    Symptoms None    Comments Reviwed METs and Home exercise Rx    Duration Continue with 30 min of aerobic exercise without signs/symptoms of physical distress.    Intensity THRR unchanged  Progression   Progression Continue to progress workloads to maintain intensity without signs/symptoms of physical distress.    Average METs 2.7      Resistance Training   Training Prescription Yes    Weight  5 lbs    Reps 10-15    Time 10 Minutes      Interval Training   Interval Training No      Bike   Level 3    Watts 25    Minutes 15    METs 2.79      Recumbant Elliptical   Level 3    RPM 57    Watts 68    Minutes 15    METs 2.6      Home Exercise Plan   Plans to continue exercise at Home (comment)    Frequency Add 2 additional days to program exercise sessions.    Initial Home Exercises Provided 05/21/23             Nutrition:  Target Goals: Understanding of nutrition guidelines, daily intake of sodium 1500mg , cholesterol 200mg , calories 30% from fat and 7% or less from saturated fats, daily to have 5 or more servings of fruits and vegetables.  Biometrics:  Pre Biometrics - 04/30/23 0857       Pre Biometrics   Waist Circumference 44 inches    Hip Circumference 45 inches    Waist to Hip Ratio 0.98 %    Triceps Skinfold 18 mm    % Body Fat 30.8 %    Grip Strength 44 kg    Flexibility 14 in    Single Leg Stand 30 seconds              Nutrition Therapy Plan and Nutrition Goals:  Nutrition Therapy & Goals - 05/03/23 1020       Nutrition Therapy   Diet Heart Healthy Diet    Drug/Food Interactions Statins/Certain Fruits      Personal Nutrition Goals   Nutrition Goal Patient to identify strategies for reducing cardiovascular risk by attending the Pritikin education and nutrition series weekly.    Personal Goal #2 Patient to improve diet quality by using the plate method as a guide for meal planning to include lean protein/plant protein, fruits, vegetables, whole grains, nonfat dairy as part of a well-balanced diet.    Personal Goal #3 Patient to identify strategies for weight loss of 0.5-2.0# per week.    Comments Mark Levine has medical history of STEMI, HTN, hyperlipidemia, CAD. He has previously completed cardiac rehab in the past. He reports using herbalife protein shake and tea for breakfast. LDL is not at goal <55; he continues zetia , crestor but will  consider PCSK9-i. Patient will benefit from participation in intensive cardiac rehab for nutrition, exercise, and lifestyle modification.      Intervention Plan   Intervention Prescribe, educate and counsel regarding individualized specific dietary modifications aiming towards targeted core components such as weight, hypertension, lipid management, diabetes, heart failure and other comorbidities.;Nutrition handout(s) given to patient.    Expected Outcomes Short Term Goal: Understand basic principles of dietary content, such as calories, fat, sodium, cholesterol and nutrients.;Long Term Goal: Adherence to prescribed nutrition plan.             Nutrition Assessments:  Nutrition Assessments - 05/07/23 1132       Rate Your Plate Scores   Pre Score 43            MEDIFICTS Score Key: >=70 Need to make dietary changes  40-70 Heart Healthy Diet <= 40 Therapeutic Level Cholesterol Diet   Flowsheet Row INTENSIVE CARDIAC REHAB from 05/07/2023 in [facility]  Picture Your Plate Total Score on Admission 43      Picture Your Plate Scores: <16 Unhealthy dietary pattern with much room for improvement. 41-50 Dietary pattern unlikely to meet recommendations for good health and room for improvement. 51-60 More healthful dietary pattern, with some room for improvement.  >60 Healthy dietary pattern, although there may be some specific behaviors that could be improved.    Nutrition Goals Re-Evaluation:  Nutrition Goals Re-Evaluation     Row Name 05/03/23 1020             Goals   Current Weight 227 lb 4.7 oz (103.1 kg)       Comment LDL 68, HDL 35, A1c 5.7, Lpa 100.5       Expected Outcome Mark Levine has medical history of STEMI, HTN, hyperlipidemia, CAD. He has previously completed cardiac rehab in the past. He reports using herbalife protein shake and tea for breakfast. LDL is not at goal <55; he continues zetia , crestor but will  consider PCSK9-i. Patient will benefit from participation in intensive cardiac rehab for nutrition, exercise, and lifestyle modification.                Nutrition Goals Re-Evaluation:  Nutrition Goals Re-Evaluation     Row Name 05/03/23 1020             Goals   Current Weight 227 lb 4.7 oz (103.1 kg)       Comment LDL 68, HDL 35, A1c 5.7, Lpa 100.5       Expected Outcome Mark Levine has medical history of STEMI, HTN, hyperlipidemia, CAD. He has previously completed cardiac rehab in the past. He reports using herbalife protein shake and tea for breakfast. LDL is not at goal <55; he continues zetia , crestor but will consider PCSK9-i. Patient will benefit from participation in intensive cardiac rehab for nutrition, exercise, and lifestyle modification.                Nutrition Goals Discharge (Final Nutrition Goals Re-Evaluation):  Nutrition Goals Re-Evaluation - 05/03/23 1020       Goals   Current Weight 227 lb 4.7 oz (103.1 kg)    Comment LDL 68, HDL 35, A1c 5.7, Lpa 100.5    Expected Outcome Mark Levine has medical history of STEMI, HTN, hyperlipidemia, CAD. He has previously completed cardiac rehab in the past. He reports using herbalife protein shake and tea for breakfast. LDL is not at goal <55; he continues zetia , crestor but will consider PCSK9-i. Patient will benefit from participation in intensive cardiac rehab for nutrition, exercise, and lifestyle modification.             Psychosocial: Target Goals: Acknowledge presence or absence of significant depression and/or stress, maximize coping skills, provide positive support system. Participant is able to verbalize types and ability to use techniques and skills needed for reducing stress and depression.  Initial Review & Psychosocial Screening:  Initial Psych Review & Screening - 04/30/23 0902       Initial Review   Current issues with None Identified      Family Dynamics   Good Support System? Yes   Wife for support    Comments Lela denies any feelings of depression/anxiety/stress. He shared that he often has to get up several times a night to use the restroom and that  is disruptive to his sleep, however his medications he takes to help him sleep at night are working well for him. He denies any need for additional resources at this time.      Barriers   Psychosocial barriers to participate in program There are no identifiable barriers or psychosocial needs.      Screening Interventions   Interventions Encouraged to exercise;Provide feedback about the scores to participant    Expected Outcomes Short Term goal: Identification and review with participant of any Quality of Life or Depression concerns found by scoring the questionnaire.;Long Term goal: The participant improves quality of Life and PHQ9 Scores as seen by post scores and/or verbalization of changes;Long Term Goal: Stressors or current issues are controlled or eliminated.             Quality of Life Scores:  Quality of Life - 04/30/23 1024       Quality of Life   Select Quality of Life      Quality of Life Scores   Health/Function Pre 23.86 %    Socioeconomic Pre 23.14 %    Psych/Spiritual Pre 24.86 %    Family Pre 24.6 %    GLOBAL Pre 24.03 %            Scores of 19 and below usually indicate a poorer quality of life in these areas.  A difference of  2-3 points is a clinically meaningful difference.  A difference of 2-3 points in the total score of the Quality of Life Index has been associated with significant improvement in overall quality of life, self-image, physical symptoms, and general health in studies assessing change in quality of life.  PHQ-9: Review Flowsheet       04/30/2023 07/24/2013 04/24/2013  Depression screen PHQ 2/9  Decreased Interest 0 0 0  Down, Depressed, Hopeless 0 0 0  PHQ - 2 Score 0 0 0  Altered sleeping 0 - -  Tired, decreased energy 0 - -  Change in appetite 0 - -  Feeling bad or failure about  yourself  0 - -  Trouble concentrating 0 - -  Moving slowly or fidgety/restless 0 - -  Suicidal thoughts 0 - -  PHQ-9 Score 0 - -   Interpretation of Total Score  Total Score Depression Severity:  1-4 = Minimal depression, 5-9 = Mild depression, 10-14 = Moderate depression, 15-19 = Moderately severe depression, 20-27 = Severe depression   Psychosocial Evaluation and Intervention:   Psychosocial Re-Evaluation:  Psychosocial Re-Evaluation     Row Name 05/03/23 0955 05/20/23 1346           Psychosocial Re-Evaluation   Current issues with None Identified None Identified      Interventions Encouraged to attend Cardiac Rehabilitation for the exercise Encouraged to attend Cardiac Rehabilitation for the exercise      Continue Psychosocial Services  No Follow up required No Follow up required               Psychosocial Discharge (Final Psychosocial Re-Evaluation):  Psychosocial Re-Evaluation - 05/20/23 1346       Psychosocial Re-Evaluation   Current issues with None Identified    Interventions Encouraged to attend Cardiac Rehabilitation for the exercise    Continue Psychosocial Services  No Follow up required             Vocational Rehabilitation: Provide vocational rehab assistance to qualifying candidates.   Vocational Rehab Evaluation & Intervention:  Vocational Rehab - 04/30/23 1610  Initial Vocational Rehab Evaluation & Intervention   Assessment shows need for Vocational Rehabilitation No   Mark Levine is retired.            Education: Education Goals: Education classes will be provided on a weekly basis, covering required topics. Participant will state understanding/return demonstration of topics presented.    Education     Row Name 05/03/23 0900     Education   Cardiac Education Topics Pritikin   Select Core Videos     Core Videos   Educator Exercise Physiologist   Select Exercise Education   Exercise Education Biomechanial Limitations    Instruction Review Code 1- Verbalizes Understanding   Class Start Time 0808   Class Stop Time 0841   Class Time Calculation (min) 33 min    Row Name 05/05/23 0900     Education   Cardiac Education Topics Pritikin   Secondary school teacher School   Educator Dietitian   Weekly Topic Fast Evening Meals   Instruction Review Code 1- Verbalizes Understanding   Class Start Time 0815   Class Stop Time 1610   Class Time Calculation (min) 38 min    Row Name 05/07/23 0900     Education   Cardiac Education Topics Pritikin   Select Core Videos     Core Videos   Educator Dietitian   Select Nutrition   Nutrition Vitamins and Minerals   Instruction Review Code 1- Verbalizes Understanding   Class Start Time 0815   Class Stop Time 0857   Class Time Calculation (min) 42 min    Row Name 05/10/23 0800     Education   Cardiac Education Topics Pritikin   Select Core Videos     Core Videos   Educator Dietitian   Select Nutrition   Nutrition Fueling a Healthy Body   Instruction Review Code 1- Verbalizes Understanding   Class Start Time 0815   Class Stop Time 0900   Class Time Calculation (min) 45 min    Row Name 05/12/23 0900     Education   Cardiac Education Topics Pritikin   Customer service manager   Weekly Topic International Cuisine- Spotlight on the [facility] Zones   Instruction Review Code 1- Verbalizes Understanding   Class Start Time 0815   Class Stop Time 0850   Class Time Calculation (min) 35 min    Row Name 05/14/23 0800     Education   Cardiac Education Topics Pritikin   Select Core Videos     Core Videos   Educator Exercise Physiologist   Select Exercise Education   Exercise Education Improving Performance   Instruction Review Code 1- Verbalizes Understanding   Class Start Time [phone]   Class Stop Time 0848   Class Time Calculation (min) 37 min    Row Name 05/17/23 0900     Education   Cardiac Education  Topics Pritikin   Select Workshops     Workshops   Educator Exercise Physiologist   Select Psychosocial   Psychosocial Workshop Healthy Sleep for a Healthy Heart   Instruction Review Code 1- Verbalizes Understanding   Class Start Time [phone]   Class Stop Time 0858   Class Time Calculation (min) 46 min    Row Name 05/24/23 0800     Education   Cardiac Education Topics Pritikin   Geographical information systems officer Exercise  Exercise Workshop Managing Heart Disease: Your Path to a Healthier Heart   Instruction Review Code 1- Verbalizes Understanding            Core Videos: Exercise    Move It!  Clinical staff conducted group or individual video education with verbal and written material and guidebook.  Patient learns the recommended Pritikin exercise program. Exercise with the goal of living a long, healthy life. Some of the health benefits of exercise include controlled diabetes, healthier blood pressure levels, improved cholesterol levels, improved heart and lung capacity, improved sleep, and better body composition. Everyone should speak with their doctor before starting or changing an exercise routine.  Biomechanical Limitations Clinical staff conducted group or individual video education with verbal and written material and guidebook.  Patient learns how biomechanical limitations can impact exercise and how we can mitigate and possibly overcome limitations to have an impactful and balanced exercise routine.  Body Composition Clinical staff conducted group or individual video education with verbal and written material and guidebook.  Patient learns that body composition (ratio of muscle mass to fat mass) is a key component to assessing overall fitness, rather than body weight alone. Increased fat mass, especially visceral belly fat, can put us  at increased risk for metabolic syndrome, type 2 diabetes, heart disease, and even death. It is  recommended to combine diet and exercise (cardiovascular and resistance training) to improve your body composition. Seek guidance from your physician and exercise physiologist before implementing an exercise routine.  Exercise Action Plan Clinical staff conducted group or individual video education with verbal and written material and guidebook.  Patient learns the recommended strategies to achieve and enjoy long-term exercise adherence, including variety, self-motivation, self-efficacy, and positive decision making. Benefits of exercise include fitness, good health, weight management, more energy, better sleep, less stress, and overall well-being.  Medical   Heart Disease Risk Reduction Clinical staff conducted group or individual video education with verbal and written material and guidebook.  Patient learns our heart is our most vital organ as it circulates oxygen, nutrients, white blood cells, and hormones throughout the entire body, and carries waste away. Data supports a plant-based eating plan like the Pritikin Program for its effectiveness in slowing progression of and reversing heart disease. The video provides a number of recommendations to address heart disease.   Metabolic Syndrome and Belly Fat  Clinical staff conducted group or individual video education with verbal and written material and guidebook.  Patient learns what metabolic syndrome is, how it leads to heart disease, and how one can reverse it and keep it from coming back. You have metabolic syndrome if you have 3 of the following 5 criteria: abdominal obesity, high blood pressure, high triglycerides, low HDL cholesterol, and high blood sugar.  Hypertension and Heart Disease Clinical staff conducted group or individual video education with verbal and written material and guidebook.  Patient learns that high blood pressure, or hypertension, is very common in the [place] . Hypertension is largely due to excessive salt  intake, but other important risk factors include being overweight, physical inactivity, drinking too much alcohol, smoking, and not eating enough potassium from fruits and vegetables. High blood pressure is a leading risk factor for heart attack, stroke, congestive heart failure, dementia, kidney failure, and premature death. Long-term effects of excessive salt intake include stiffening of the arteries and thickening of heart muscle and organ damage. Recommendations include ways to reduce hypertension and the risk of heart disease.  Diseases of Our Time - Focusing  on Diabetes Clinical staff conducted group or individual video education with verbal and written material and guidebook.  Patient learns why the best way to stop diseases of our time is prevention, through food and other lifestyle changes. Medicine (such as prescription pills and surgeries) is often only a Band-Aid on the problem, not a long-term solution. Most common diseases of our time include obesity, type 2 diabetes, hypertension, heart disease, and cancer. The Pritikin Program is recommended and has been proven to help reduce, reverse, and/or prevent the damaging effects of metabolic syndrome.  Nutrition   Overview of the Pritikin Eating Plan  Clinical staff conducted group or individual video education with verbal and written material and guidebook.  Patient learns about the Pritikin Eating Plan for disease risk reduction. The Pritikin Eating Plan emphasizes a wide variety of unrefined, minimally-processed carbohydrates, like fruits, vegetables, whole grains, and legumes. Go, Caution, and Stop food choices are explained. Plant-based and lean animal proteins are emphasized. Rationale provided for low sodium intake for blood pressure control, low added sugars for blood sugar stabilization, and low added fats and oils for coronary artery disease risk reduction and weight management.  Calorie Density  Clinical staff conducted group or  individual video education with verbal and written material and guidebook.  Patient learns about calorie density and how it impacts the Pritikin Eating Plan. Knowing the characteristics of the food you choose will help you decide whether those foods will lead to weight gain or weight loss, and whether you want to consume more or less of them. Weight loss is usually a side effect of the Pritikin Eating Plan because of its focus on low calorie-dense foods.  Label Reading  Clinical staff conducted group or individual video education with verbal and written material and guidebook.  Patient learns about the Pritikin recommended label reading guidelines and corresponding recommendations regarding calorie density, added sugars, sodium content, and whole grains.  Dining Out - Part 1  Clinical staff conducted group or individual video education with verbal and written material and guidebook.  Patient learns that restaurant meals can be sabotaging because they can be so high in calories, fat, sodium, and/or sugar. Patient learns recommended strategies on how to positively address this and avoid unhealthy pitfalls.  Facts on Fats  Clinical staff conducted group or individual video education with verbal and written material and guidebook.  Patient learns that lifestyle modifications can be just as effective, if not more so, as many medications for lowering your risk of heart disease. A Pritikin lifestyle can help to reduce your risk of inflammation and atherosclerosis (cholesterol build-up, or plaque, in the artery walls). Lifestyle interventions such as dietary choices and physical activity address the cause of atherosclerosis. A review of the types of fats and their impact on blood cholesterol levels, along with dietary recommendations to reduce fat intake is also included.  Nutrition Action Plan  Clinical staff conducted group or individual video education with verbal and written material and guidebook.   Patient learns how to incorporate Pritikin recommendations into their lifestyle. Recommendations include planning and keeping personal health goals in mind as an important part of their success.  Healthy Mind-Set    Healthy Minds, Bodies, Hearts  Clinical staff conducted group or individual video education with verbal and written material and guidebook.  Patient learns how to identify when they are stressed. Video will discuss the impact of that stress, as well as the many benefits of stress management. Patient will also be introduced to stress management  techniques. The way we think, act, and feel has an impact on our hearts.  How Our Thoughts Can Heal Our Hearts  Clinical staff conducted group or individual video education with verbal and written material and guidebook.  Patient learns that negative thoughts can cause depression and anxiety. This can result in negative lifestyle behavior and serious health problems. Cognitive behavioral therapy is an effective method to help control our thoughts in order to change and improve our emotional outlook.  Additional Videos:  Exercise    Improving Performance  Clinical staff conducted group or individual video education with verbal and written material and guidebook.  Patient learns to use a non-linear approach by alternating intensity levels and lengths of time spent exercising to help burn more calories and lose more body fat. Cardiovascular exercise helps improve heart health, metabolism, hormonal balance, blood sugar control, and recovery from fatigue. Resistance training improves strength, endurance, balance, coordination, reaction time, metabolism, and muscle mass. Flexibility exercise improves circulation, posture, and balance. Seek guidance from your physician and exercise physiologist before implementing an exercise routine and learn your capabilities and proper form for all exercise.  Introduction to Yoga  Clinical staff conducted group or  individual video education with verbal and written material and guidebook.  Patient learns about yoga, a discipline of the coming together of mind, breath, and body. The benefits of yoga include improved flexibility, improved range of motion, better posture and core strength, increased lung function, weight loss, and positive self-image. Yoga's heart health benefits include lowered blood pressure, healthier heart rate, decreased cholesterol and triglyceride levels, improved immune function, and reduced stress. Seek guidance from your physician and exercise physiologist before implementing an exercise routine and learn your capabilities and proper form for all exercise.  Medical   Aging: Enhancing Your Quality of Life  Clinical staff conducted group or individual video education with verbal and written material and guidebook.  Patient learns key strategies and recommendations to stay in good physical health and enhance quality of life, such as prevention strategies, having an advocate, securing a Health Care Proxy and Power of Attorney, and keeping a list of medications and system for tracking them. It also discusses how to avoid risk for bone loss.  Biology of Weight Control  Clinical staff conducted group or individual video education with verbal and written material and guidebook.  Patient learns that weight gain occurs because we consume more calories than we burn (eating more, moving less). Even if your body weight is normal, you may have higher ratios of fat compared to muscle mass. Too much body fat puts you at increased risk for cardiovascular disease, heart attack, stroke, type 2 diabetes, and obesity-related cancers. In addition to exercise, following the Pritikin Eating Plan can help reduce your risk.  Decoding Lab Results  Clinical staff conducted group or individual video education with verbal and written material and guidebook.  Patient learns that lab test reflects one measurement whose  values change over time and are influenced by many factors, including medication, stress, sleep, exercise, food, hydration, pre-existing medical conditions, and more. It is recommended to use the knowledge from this video to become more involved with your lab results and evaluate your numbers to speak with your doctor.   Diseases of Our Time - Overview  Clinical staff conducted group or individual video education with verbal and written material and guidebook.  Patient learns that according to the CDC, 50% to 70% of chronic diseases (such as obesity, type 2 diabetes, elevated lipids,  hypertension, and heart disease) are avoidable through lifestyle improvements including healthier food choices, listening to satiety cues, and increased physical activity.  Sleep Disorders Clinical staff conducted group or individual video education with verbal and written material and guidebook.  Patient learns how good quality and duration of sleep are important to overall health and well-being. Patient also learns about sleep disorders and how they impact health along with recommendations to address them, including discussing with a physician.  Nutrition  Dining Out - Part 2 Clinical staff conducted group or individual video education with verbal and written material and guidebook.  Patient learns how to plan ahead and communicate in order to maximize their dining experience in a healthy and nutritious manner. Included are recommended food choices based on the type of restaurant the patient is visiting.   Fueling a Banker conducted group or individual video education with verbal and written material and guidebook.  There is a strong connection between our food choices and our health. Diseases like obesity and type 2 diabetes are very prevalent and are in large-part due to lifestyle choices. The Pritikin Eating Plan provides plenty of food and hunger-curbing satisfaction. It is easy to follow,  affordable, and helps reduce health risks.  Menu Workshop  Clinical staff conducted group or individual video education with verbal and written material and guidebook.  Patient learns that restaurant meals can sabotage health goals because they are often packed with calories, fat, sodium, and sugar. Recommendations include strategies to plan ahead and to communicate with the manager, chef, or server to help order a healthier meal.  Planning Your Eating Strategy  Clinical staff conducted group or individual video education with verbal and written material and guidebook.  Patient learns about the Pritikin Eating Plan and its benefit of reducing the risk of disease. The Pritikin Eating Plan does not focus on calories. Instead, it emphasizes high-quality, nutrient-rich foods. By knowing the characteristics of the foods, we choose, we can determine their calorie density and make informed decisions.  Targeting Your Nutrition Priorities  Clinical staff conducted group or individual video education with verbal and written material and guidebook.  Patient learns that lifestyle habits have a tremendous impact on disease risk and progression. This video provides eating and physical activity recommendations based on your personal health goals, such as reducing LDL cholesterol, losing weight, preventing or controlling type 2 diabetes, and reducing high blood pressure.  Vitamins and Minerals  Clinical staff conducted group or individual video education with verbal and written material and guidebook.  Patient learns different ways to obtain key vitamins and minerals, including through a recommended healthy diet. It is important to discuss all supplements you take with your doctor.   Healthy Mind-Set    Smoking Cessation  Clinical staff conducted group or individual video education with verbal and written material and guidebook.  Patient learns that cigarette smoking and tobacco addiction pose a serious health  risk which affects millions of people. Stopping smoking will significantly reduce the risk of heart disease, lung disease, and many forms of cancer. Recommended strategies for quitting are covered, including working with your doctor to develop a successful plan.  Culinary   Becoming a Set designer conducted group or individual video education with verbal and written material and guidebook.  Patient learns that cooking at home can be healthy, cost-effective, quick, and puts them in control. Keys to cooking healthy recipes will include looking at your recipe, assessing your equipment needs, planning ahead,  making it simple, choosing cost-effective seasonal ingredients, and limiting the use of added fats, salts, and sugars.  Cooking - Breakfast and Snacks  Clinical staff conducted group or individual video education with verbal and written material and guidebook.  Patient learns how important breakfast is to satiety and nutrition through the entire day. Recommendations include key foods to eat during breakfast to help stabilize blood sugar levels and to prevent overeating at meals later in the day. Planning ahead is also a key component.  Cooking - Educational psychologist conducted group or individual video education with verbal and written material and guidebook.  Patient learns eating strategies to improve overall health, including an approach to cook more at home. Recommendations include thinking of animal protein as a side on your plate rather than center stage and focusing instead on lower calorie dense options like vegetables, fruits, whole grains, and plant-based proteins, such as beans. Making sauces in large quantities to freeze for later and leaving the skin on your vegetables are also recommended to maximize your experience.  Cooking - Healthy Salads and Dressing Clinical staff conducted group or individual video education with verbal and written material and  guidebook.  Patient learns that vegetables, fruits, whole grains, and legumes are the foundations of the Pritikin Eating Plan. Recommendations include how to incorporate each of these in flavorful and healthy salads, and how to create homemade salad dressings. Proper handling of ingredients is also covered. Cooking - Soups and [employer] - Soups and Desserts Clinical staff conducted group or individual video education with verbal and written material and guidebook.  Patient learns that Pritikin soups and desserts make for easy, nutritious, and delicious snacks and meal components that are low in sodium, fat, sugar, and calorie density, while high in vitamins, minerals, and filling fiber. Recommendations include simple and healthy ideas for soups and desserts.   Overview     The Pritikin Solution Program Overview Clinical staff conducted group or individual video education with verbal and written material and guidebook.  Patient learns that the results of the Pritikin Program have been documented in more than 100 articles published in peer-reviewed journals, and the benefits include reducing risk factors for (and, in some cases, even reversing) high cholesterol, high blood pressure, type 2 diabetes, obesity, and more! An overview of the three key pillars of the Pritikin Program will be covered: eating well, doing regular exercise, and having a healthy mind-set.  WORKSHOPS  Exercise: Exercise Basics: Building Your Action Plan Clinical staff led group instruction and group discussion with PowerPoint presentation and patient guidebook. To enhance the learning environment the use of posters, models and videos may be added. At the conclusion of this workshop, patients will comprehend the difference between physical activity and exercise, as well as the benefits of incorporating both, into their routine. Patients will understand the FITT (Frequency, Intensity, Time, and Type) principle and how to  use it to build an exercise action plan. In addition, safety concerns and other considerations for exercise and cardiac rehab will be addressed by the presenter. The purpose of this lesson is to promote a comprehensive and effective weekly exercise routine in order to improve patients' overall level of fitness.   Managing Heart Disease: Your Path to a Healthier Heart Clinical staff led group instruction and group discussion with PowerPoint presentation and patient guidebook. To enhance the learning environment the use of posters, models and videos may be added.At the conclusion of this workshop, patients will understand the  anatomy and physiology of the heart. Additionally, they will understand how Pritikin's three pillars impact the risk factors, the progression, and the management of heart disease.  The purpose of this lesson is to provide a high-level overview of the heart, heart disease, and how the Pritikin lifestyle positively impacts risk factors.  Exercise Biomechanics Clinical staff led group instruction and group discussion with PowerPoint presentation and patient guidebook. To enhance the learning environment the use of posters, models and videos may be added. Patients will learn how the structural parts of their bodies function and how these functions impact their daily activities, movement, and exercise. Patients will learn how to promote a neutral spine, learn how to manage pain, and identify ways to improve their physical movement in order to promote healthy living. The purpose of this lesson is to expose patients to common physical limitations that impact physical activity. Participants will learn practical ways to adapt and manage aches and pains, and to minimize their effect on regular exercise. Patients will learn how to maintain good posture while sitting, walking, and lifting.  Balance Training and Fall Prevention  Clinical staff led group instruction and group discussion  with PowerPoint presentation and patient guidebook. To enhance the learning environment the use of posters, models and videos may be added. At the conclusion of this workshop, patients will understand the importance of their sensorimotor skills (vision, proprioception, and the vestibular system) in maintaining their ability to balance as they age. Patients will apply a variety of balancing exercises that are appropriate for their current level of function. Patients will understand the common causes for poor balance, possible solutions to these problems, and ways to modify their physical environment in order to minimize their fall risk. The purpose of this lesson is to teach patients about the importance of maintaining balance as they age and ways to minimize their risk of falling.  WORKSHOPS   Nutrition:  Fueling a Ship broker led group instruction and group discussion with PowerPoint presentation and patient guidebook. To enhance the learning environment the use of posters, models and videos may be added. Patients will review the foundational principles of the Pritikin Eating Plan and understand what constitutes a serving size in each of the food groups. Patients will also learn Pritikin-friendly foods that are better choices when away from home and review make-ahead meal and snack options. Calorie density will be reviewed and applied to three nutrition priorities: weight maintenance, weight loss, and weight gain. The purpose of this lesson is to reinforce (in a group setting) the key concepts around what patients are recommended to eat and how to apply these guidelines when away from home by planning and selecting Pritikin-friendly options. Patients will understand how calorie density may be adjusted for different weight management goals.  Mindful Eating  Clinical staff led group instruction and group discussion with PowerPoint presentation and patient guidebook. To enhance the  learning environment the use of posters, models and videos may be added. Patients will briefly review the concepts of the Pritikin Eating Plan and the importance of low-calorie dense foods. The concept of mindful eating will be introduced as well as the importance of paying attention to internal hunger signals. Triggers for non-hunger eating and techniques for dealing with triggers will be explored. The purpose of this lesson is to provide patients with the opportunity to review the basic principles of the Pritikin Eating Plan, discuss the value of eating mindfully and how to measure internal cues of hunger and fullness using  the Hunger Scale. Patients will also discuss reasons for non-hunger eating and learn strategies to use for controlling emotional eating.  Targeting Your Nutrition Priorities Clinical staff led group instruction and group discussion with PowerPoint presentation and patient guidebook. To enhance the learning environment the use of posters, models and videos may be added. Patients will learn how to determine their genetic susceptibility to disease by reviewing their family history. Patients will gain insight into the importance of diet as part of an overall healthy lifestyle in mitigating the impact of genetics and other environmental insults. The purpose of this lesson is to provide patients with the opportunity to assess their personal nutrition priorities by looking at their family history, their own health history and current risk factors. Patients will also be able to discuss ways of prioritizing and modifying the Pritikin Eating Plan for their highest risk areas  Menu  Clinical staff led group instruction and group discussion with PowerPoint presentation and patient guidebook. To enhance the learning environment the use of posters, models and videos may be added. Using menus brought in from [employer], or printed from [employer], patients will apply the Pritikin dining out  guidelines that were presented in the [employer] video. Patients will also be able to practice these guidelines in a variety of provided scenarios. The purpose of this lesson is to provide patients with the opportunity to practice hands-on learning of the Pritikin Dining Out guidelines with actual menus and practice scenarios.  Label Reading Clinical staff led group instruction and group discussion with PowerPoint presentation and patient guidebook. To enhance the learning environment the use of posters, models and videos may be added. Patients will review and discuss the [employer] label reading guidelines presented in [employer] Label Reading Educational series video. Using fool labels brought in from local grocery stores and markets, patients will apply the label reading guidelines and determine if the packaged food meet the [facility] guidelines. The purpose of this lesson is to provide patients with the opportunity to review, discuss, and practice hands-on learning of the [employer] Label Reading guidelines with actual packaged food labels. Cooking School  [employer]'s [employer] are designed to teach patients ways to prepare quick, simple, and affordable recipes at home. The importance of nutrition's role in chronic disease risk reduction is reflected in its emphasis in the overall [employer] program. By learning how to prepare essential core [employer] Eating Plan recipes, patients will increase control over what they eat; be able to customize the flavor of foods without the use of added salt, sugar, or fat; and improve the quality of the food they consume. By learning a set of core recipes which are easily assembled, quickly prepared, and affordable, patients are more likely to prepare more healthy foods at home. These workshops focus on convenient breakfasts, simple entres, side dishes, and desserts which can be prepared with minimal effort and are consistent with nutrition  recommendations for cardiovascular risk reduction. Cooking [employer] are taught by a Armed forces logistics/support/administrative officer (RD) who has been trained by the [employer]. The chef or RD has a clear understanding of the importance of minimizing - if not completely eliminating - added fat, sugar, and sodium in recipes. Throughout the series of Cooking School Workshop sessions, patients will learn about healthy ingredients and efficient methods of cooking to build confidence in their capability to prepare    Cooking School weekly topics:  Adding Flavor- Sodium-Free  Fast and Healthy Breakfasts  Powerhouse Plant-Based  Proteins  Satisfying Salads and Dressings  Simple Sides and Sauces  International Cuisine-Spotlight on the Blue Zones  Delicious Desserts  Savory Soups  Efficiency Cooking - Meals in a Snap  Tasty Appetizers and Snacks  Comforting Weekend Breakfasts  One-Pot Wonders   Fast Evening Meals  Landscape architect Your Pritikin Plate  WORKSHOPS   Healthy Mindset (Psychosocial):  Focused Goals, Sustainable Changes Clinical staff led group instruction and group discussion with PowerPoint presentation and patient guidebook. To enhance the learning environment the use of posters, models and videos may be added. Patients will be able to apply effective goal setting strategies to establish at least one personal goal, and then take consistent, meaningful action toward that goal. They will learn to identify common barriers to achieving personal goals and develop strategies to overcome them. Patients will also gain an understanding of how our mind-set can impact our ability to achieve goals and the importance of cultivating a positive and growth-oriented mind-set. The purpose of this lesson is to provide patients with a deeper understanding of how to set and achieve personal goals, as well as the tools and strategies needed to overcome common obstacles which may arise along  the way.  From Head to Heart: The Power of a Healthy Outlook  Clinical staff led group instruction and group discussion with PowerPoint presentation and patient guidebook. To enhance the learning environment the use of posters, models and videos may be added. Patients will be able to recognize and describe the impact of emotions and mood on physical health. They will discover the importance of self-care and explore self-care practices which may work for them. Patients will also learn how to utilize the 4 C's to cultivate a healthier outlook and better manage stress and challenges. The purpose of this lesson is to demonstrate to patients how a healthy outlook is an essential part of maintaining good health, especially as they continue their cardiac rehab journey.  Healthy Sleep for a Healthy Heart Clinical staff led group instruction and group discussion with PowerPoint presentation and patient guidebook. To enhance the learning environment the use of posters, models and videos may be added. At the conclusion of this workshop, patients will be able to demonstrate knowledge of the importance of sleep to overall health, well-being, and quality of life. They will understand the symptoms of, and treatments for, common sleep disorders. Patients will also be able to identify daytime and nighttime behaviors which impact sleep, and they will be able to apply these tools to help manage sleep-related challenges. The purpose of this lesson is to provide patients with a general overview of sleep and outline the importance of quality sleep. Patients will learn about a few of the most common sleep disorders. Patients will also be introduced to the concept of "sleep hygiene," and discover ways to self-manage certain sleeping problems through simple daily behavior changes. Finally, the workshop will motivate patients by clarifying the links between quality sleep and their goals of heart-healthy living.   Recognizing and  Reducing Stress Clinical staff led group instruction and group discussion with PowerPoint presentation and patient guidebook. To enhance the learning environment the use of posters, models and videos may be added. At the conclusion of this workshop, patients will be able to understand the types of stress reactions, differentiate between acute and chronic stress, and recognize the impact that chronic stress has on their health. They will also be able to apply different coping mechanisms, such as reframing negative self-talk. Patients will have  the opportunity to practice a variety of stress management techniques, such as deep abdominal breathing, progressive muscle relaxation, and/or guided imagery.  The purpose of this lesson is to educate patients on the role of stress in their lives and to provide healthy techniques for coping with it.  Learning Barriers/Preferences:  Learning Barriers/Preferences - 04/30/23 1610       Learning Barriers/Preferences   Learning Barriers Sight   glasses   Learning Preferences Audio;Computer/Internet;Group Instruction;Individual Instruction;Skilled Demonstration;Verbal Instruction;Video;Written Material;Pictoral             Education Topics:  Knowledge Questionnaire Score:  Knowledge Questionnaire Score - 04/30/23 0906       Knowledge Questionnaire Score   Pre Score 19/24             Core Components/Risk Factors/Patient Goals at Admission:  Personal Goals and Risk Factors at Admission - 04/30/23 0905       Core Components/Risk Factors/Patient Goals on Admission    Weight Management Yes;Obesity;Weight Loss    Intervention Weight Management: Develop a combined nutrition and exercise program designed to reach desired caloric intake, while maintaining appropriate intake of nutrient and fiber, sodium and fats, and appropriate energy expenditure required for the weight goal.;Weight Management: Provide education and appropriate resources to help  participant work on and attain dietary goals.;Weight Management/Obesity: Establish reasonable short term and long term weight goals.;Obesity: Provide education and appropriate resources to help participant work on and attain dietary goals.    Admit Weight 225 lb (102.1 kg)    Goal Weight: Long Term 205 lb (93 kg)   pt goal   Hypertension Yes    Intervention Provide education on lifestyle modifcations including regular physical activity/exercise, weight management, moderate sodium restriction and increased consumption of fresh fruit, vegetables, and low fat dairy, alcohol moderation, and smoking cessation.;Monitor prescription use compliance.    Expected Outcomes Short Term: Continued assessment and intervention until BP is < 140/33mm HG in hypertensive participants. < 130/61mm HG in hypertensive participants with diabetes, heart failure or chronic kidney disease.;Long Term: Maintenance of blood pressure at goal levels.    Lipids Yes    Intervention Provide education and support for participant on nutrition & aerobic/resistive exercise along with prescribed medications to achieve LDL 70mg , HDL >40mg .    Expected Outcomes Long Term: Cholesterol controlled with medications as prescribed, with individualized exercise RX and with personalized nutrition plan. Value goals: LDL < 70mg , HDL > 40 mg.;Short Term: Participant states understanding of desired cholesterol values and is compliant with medications prescribed. Participant is following exercise prescription and nutrition guidelines.             Core Components/Risk Factors/Patient Goals Review:   Goals and Risk Factor Review     Row Name 05/20/23 1351             Core Components/Risk Factors/Patient Goals Review   Personal Goals Review Weight Management/Obesity;Hypertension;Lipids       Review Mark Levine is off to a good start to exercise at cardiac rehab. Resting sytolic BP noted in the low 90's. Patient asymptomatic. Will continue to monitor  BP.  Mark Levine has lost 3 kg since starting cardiac rehab.       Expected Outcomes Mark Levine will continue to participate in cardiac rehab for exercise, nutrition and lifestyle modifications                Core Components/Risk Factors/Patient Goals at Discharge (Final Review):   Goals and Risk Factor Review - 05/20/23 1351  Core Components/Risk Factors/Patient Goals Review   Personal Goals Review Weight Management/Obesity;Hypertension;Lipids    Review Mark Levine is off to a good start to exercise at cardiac rehab. Resting sytolic BP noted in the low 90's. Patient asymptomatic. Will continue to monitor BP.  Mark Levine has lost 3 kg since starting cardiac rehab.    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 Introduction to pritikin education/intensive cardiac rehab. Initial orientation packet reviewed with patient. 30 Day ITP Review. Mark Levine started cardiac rehab on 05/03/23. Mark Levine did well with exercise. 30 Day ITP Review. Mark Levine has good attendance and partcipation with exercise at cardiac rehab.              Comments: See ITP comment.

## 2023-05-26 ENCOUNTER — Encounter (HOSPITAL_COMMUNITY)
Admission: RE | Admit: 2023-05-26 | Discharge: 2023-05-26 | Disposition: A | Discharge status: Home / Self Care | Source: Ambulatory Visit | Attending: Cardiovascular Disease

## 2023-05-26 DIAGNOSIS — Z955 Presence of coronary angioplasty implant and graft: Secondary | ICD-10-CM | POA: Diagnosis not present

## 2023-05-26 DIAGNOSIS — I2111 ST elevation (STEMI) myocardial infarction involving right coronary artery: Secondary | ICD-10-CM

## 2023-05-28 ENCOUNTER — Encounter (HOSPITAL_COMMUNITY)
Admission: RE | Admit: 2023-05-28 | Discharge: 2023-05-28 | Disposition: A | Discharge status: Home / Self Care | Source: Ambulatory Visit | Attending: Cardiovascular Disease | Admitting: Cardiovascular Disease

## 2023-05-28 DIAGNOSIS — Z955 Presence of coronary angioplasty implant and graft: Secondary | ICD-10-CM | POA: Diagnosis not present

## 2023-05-28 DIAGNOSIS — I2111 ST elevation (STEMI) myocardial infarction involving right coronary artery: Secondary | ICD-10-CM

## 2023-05-31 ENCOUNTER — Encounter (HOSPITAL_COMMUNITY)
Admission: RE | Admit: 2023-05-31 | Discharge: 2023-05-31 | Disposition: A | Discharge status: Home / Self Care | Source: Ambulatory Visit | Attending: Cardiovascular Disease | Admitting: Cardiovascular Disease

## 2023-05-31 DIAGNOSIS — Z955 Presence of coronary angioplasty implant and graft: Secondary | ICD-10-CM | POA: Diagnosis not present

## 2023-05-31 DIAGNOSIS — I2111 ST elevation (STEMI) myocardial infarction involving right coronary artery: Secondary | ICD-10-CM

## 2023-06-02 ENCOUNTER — Encounter (HOSPITAL_COMMUNITY)
Admission: RE | Admit: 2023-06-02 | Discharge: 2023-06-02 | Disposition: A | Discharge status: Home / Self Care | Source: Ambulatory Visit | Attending: Cardiovascular Disease

## 2023-06-02 DIAGNOSIS — I2111 ST elevation (STEMI) myocardial infarction involving right coronary artery: Secondary | ICD-10-CM

## 2023-06-02 DIAGNOSIS — Z955 Presence of coronary angioplasty implant and graft: Secondary | ICD-10-CM

## 2023-06-04 ENCOUNTER — Encounter (HOSPITAL_COMMUNITY)
Admission: RE | Admit: 2023-06-04 | Discharge: 2023-06-04 | Disposition: A | Discharge status: Home / Self Care | Source: Ambulatory Visit | Attending: Cardiovascular Disease | Admitting: Cardiovascular Disease

## 2023-06-04 DIAGNOSIS — Z955 Presence of coronary angioplasty implant and graft: Secondary | ICD-10-CM | POA: Insufficient documentation

## 2023-06-04 DIAGNOSIS — I2111 ST elevation (STEMI) myocardial infarction involving right coronary artery: Secondary | ICD-10-CM | POA: Insufficient documentation

## 2023-06-07 ENCOUNTER — Encounter (HOSPITAL_COMMUNITY)
Admission: RE | Admit: 2023-06-07 | Discharge: 2023-06-07 | Disposition: A | Discharge status: Home / Self Care | Source: Ambulatory Visit | Attending: Cardiovascular Disease

## 2023-06-07 DIAGNOSIS — I2111 ST elevation (STEMI) myocardial infarction involving right coronary artery: Secondary | ICD-10-CM | POA: Diagnosis not present

## 2023-06-07 DIAGNOSIS — Z955 Presence of coronary angioplasty implant and graft: Secondary | ICD-10-CM | POA: Diagnosis not present

## 2023-06-08 DIAGNOSIS — M25552 Pain in left hip: Secondary | ICD-10-CM | POA: Diagnosis not present

## 2023-06-08 DIAGNOSIS — M545 Low back pain, unspecified: Secondary | ICD-10-CM | POA: Diagnosis not present

## 2023-06-09 ENCOUNTER — Encounter (HOSPITAL_COMMUNITY)
Admission: RE | Admit: 2023-06-09 | Discharge: 2023-06-09 | Disposition: A | Discharge status: Home / Self Care | Source: Ambulatory Visit | Attending: Cardiovascular Disease | Admitting: Cardiovascular Disease

## 2023-06-09 DIAGNOSIS — I2111 ST elevation (STEMI) myocardial infarction involving right coronary artery: Secondary | ICD-10-CM | POA: Diagnosis not present

## 2023-06-09 DIAGNOSIS — Z955 Presence of coronary angioplasty implant and graft: Secondary | ICD-10-CM

## 2023-06-11 ENCOUNTER — Encounter (HOSPITAL_COMMUNITY)
Admission: RE | Admit: 2023-06-11 | Discharge: 2023-06-11 | Disposition: A | Discharge status: Home / Self Care | Source: Ambulatory Visit | Attending: Cardiovascular Disease | Admitting: Cardiovascular Disease

## 2023-06-11 DIAGNOSIS — Z955 Presence of coronary angioplasty implant and graft: Secondary | ICD-10-CM | POA: Diagnosis not present

## 2023-06-11 DIAGNOSIS — I2111 ST elevation (STEMI) myocardial infarction involving right coronary artery: Secondary | ICD-10-CM

## 2023-06-14 ENCOUNTER — Encounter (HOSPITAL_COMMUNITY)

## 2023-06-16 ENCOUNTER — Encounter (HOSPITAL_COMMUNITY)

## 2023-06-18 ENCOUNTER — Encounter (HOSPITAL_COMMUNITY)

## 2023-06-21 ENCOUNTER — Encounter (HOSPITAL_COMMUNITY)
Admission: RE | Admit: 2023-06-21 | Discharge: 2023-06-21 | Disposition: A | Discharge status: Home / Self Care | Source: Ambulatory Visit | Attending: Cardiovascular Disease | Admitting: Cardiovascular Disease

## 2023-06-21 DIAGNOSIS — Z955 Presence of coronary angioplasty implant and graft: Secondary | ICD-10-CM

## 2023-06-21 DIAGNOSIS — I2111 ST elevation (STEMI) myocardial infarction involving right coronary artery: Secondary | ICD-10-CM | POA: Diagnosis not present

## 2023-06-21 NOTE — Progress Notes (Signed)
 Cardiac Individual Treatment Plan  Patient Details  Name: Mark Levine MRN: [id number] Date of Birth: [birth year] Referring Provider:   Flowsheet Row INTENSIVE CARDIAC REHAB ORIENT from 04/30/2023 in [facility]  Referring Provider Antoinette Batman, MD       Initial Encounter Date:  Flowsheet Row INTENSIVE CARDIAC REHAB ORIENT from 04/30/2023 in [facility] for Heart, Vascular, & Lung Health  Date 04/30/23       Visit Diagnosis: 04/17/23 STEMI  04/17/23 DES RCA  Patient's Home Medications on Admission:  Current Outpatient Medications:    aspirin  EC 81 MG EC tablet, Take 1 tablet (81 mg total) by mouth daily., Disp: , Rfl:    atorvastatin  (LIPITOR ) 80 MG tablet, Take 1 tablet (80 mg total) by mouth daily., Disp: 90 tablet, Rfl: 3   ezetimibe  (ZETIA ) 10 MG tablet, Take 1 tablet (10 mg total) by mouth daily., Disp: 90 tablet, Rfl: 3   losartan  (COZAAR ) 50 MG tablet, Take 50 mg by mouth daily., Disp: , Rfl:    Melatonin Gummies 5 MG CHEW, Chew 5 mg by mouth as needed (insomnia)., Disp: , Rfl:    metoprolol  succinate (TOPROL -XL) 25 MG 24 hr tablet, Take 1 tablet (25 mg total) by mouth daily., Disp: 90 tablet, Rfl: 3   nitroGLYCERIN  (NITROSTAT ) 0.4 MG SL tablet, Place 1 tablet (0.4 mg total) under the tongue every 5 (five) minutes x 3 doses as needed for chest pain. Do not take within 72 hrs of viagra , Disp: 25 tablet, Rfl: 3   prasugrel  (EFFIENT ) 10 MG TABS tablet, Take 1 tablet (10 mg total) by mouth daily., Disp: 90 tablet, Rfl: 3   traZODone (DESYREL) 50 MG tablet, Take 1 tablet by mouth as needed., Disp: , Rfl:    VIAGRA  100 MG tablet, Take 1 tablet (100 mg total) by mouth daily as needed for erectile dysfunction. Do not take within 72 hrs of nitroglycerin , Disp: 10 tablet, Rfl: 1  Past Medical History: Past Medical History:  Diagnosis Date   Coronary Artery Disease 04/04/2013   Inf STEMI in  04/2013 s/p 3 x 16 mm DES to RCA     Stress MPI 02/15/15: apical lateral infarct, no ischemia, EF 48    Inf STEMI s/p 3 x 32 mm DES to dRCA in 04/2023      LHC 04/17/23: LM 30; LAD mid 40; RI 60; RCA prox 35, dist 100; EF 55-65     TTE 04/17/23: EF 50-55, NL RVSF, mild to mod MR, AV sclerosis, inf HK     Cough due to ACE inhibitor 05/11/2014   Lisinopril  discontinued 05/11/14    Erectile dysfunction 10/11/2013   Essential hypertension 05/10/2014   History of nuclear stress test    a.Myoview 1/17: EF 48%, apical and apical lateral scar, no ischemia; Intermediate Risk (low EF)   Hyperlipidemia    MI, 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   Inferior STEMI  04/02/13     Tobacco Use: Social History   Tobacco Use  Smoking Status Never  Smokeless Tobacco Never    Labs: Review Flowsheet  More data may exist      Latest Ref Rng & Units 04/02/2013 04/03/2013 05/05/2013 02/15/2015 04/17/2023  Labs for ITP Cardiac and Pulmonary Rehab  Cholestrol 0 - 200 mg/dL - 409  811  914  782   LDL (calc) 0 - 99 mg/dL - 956  52  62  68   HDL-C >40 mg/dL - 45  42  40  35   Trlycerides <150 mg/dL - 161  33  65  21   Hemoglobin A1c 4.8 - 5.6 % 5.7  - - - 5.7     Capillary Blood Glucose: No results found for: "GLUCAP"   Exercise Target Goals: Exercise Program Goal: Individual exercise prescription set using results from initial 6 min walk test and THRR while considering  patient's activity barriers and safety.   Exercise Prescription Goal: Initial exercise prescription builds to 30-45 minutes a day of aerobic activity, 2-3 days per week.  Home exercise guidelines will be given to patient during program as part of exercise prescription that the participant will acknowledge.  Activity Barriers & Risk Stratification:  Activity Barriers & Cardiac Risk Stratification - 04/30/23 0902       Activity Barriers & Cardiac Risk Stratification   Activity Barriers Left Knee Replacement    Cardiac Risk Stratification High   <5 METs on             6 Minute Walk:  6 Minute Walk     Row Name 04/30/23 1023         6 Minute Walk   Phase Initial     Distance 1756 feet     Walk Time 6 minutes     # of Rest Breaks 0     MPH 3.38     METS 3.81     RPE 11     Perceived Dyspnea  0     VO2 Peak 13.35     Symptoms Yes (comment)     Comments L knee stiffness, no pain or s/sx     Resting HR 76 bpm     Resting BP 118/72     Resting Oxygen Saturation  99 %     Exercise Oxygen Saturation  during 6 min walk 99 %     Max Ex. HR 95 bpm     Max Ex. BP 142/70     2 Minute Post BP 126/70              Oxygen Initial Assessment:   Oxygen Re-Evaluation:   Oxygen Discharge (Final Oxygen Re-Evaluation):   Initial Exercise Prescription:  Initial Exercise Prescription - 04/30/23 1000       Date of Initial Exercise RX and Referring Provider   Date 04/30/23    Referring Provider Antoinette Batman, MD    Expected Discharge Date 07/21/23      Bike   Level 1.5    Watts 40    Minutes 15    METs 2.5      NuStep   Level --    SPM --    Minutes --    METs --      Recumbant Elliptical   Level 2    RPM 60    Watts 20    Minutes 15    METs 2      Prescription Details   Frequency (times per week) 3    Duration Progress to 30 minutes of continuous aerobic without signs/symptoms of physical distress      Intensity   THRR 40-80% of Max Heartrate 61-123    Ratings of Perceived Exertion 11-13    Perceived Dyspnea 0-4      Progression   Progression Continue progressive overload as per policy without signs/symptoms or physical distress.      Resistance Training   Training Prescription Yes    Weight 4    Reps 10-15  Perform Capillary Blood Glucose checks as needed.  Exercise Prescription Changes:   Exercise Prescription Changes     Row Name 05/03/23 1100 05/21/23 1400 06/02/23 1200 06/21/23 1100       Response to Exercise   Blood Pressure (Admit) 112/62 122/66 104/62 104/60    Blood  Pressure (Exercise) 128/78 128/60 130/72 --    Blood Pressure (Exit) 118/72 118/64 116/62 104/70    Heart Rate (Admit) 74 bpm 67 bpm 67 bpm 60 bpm    Heart Rate (Exercise) 85 bpm 103 bpm 83 bpm 106 bpm    Heart Rate (Exit) 75 bpm 64 bpm 72 bpm 69 bpm    Rating of Perceived Exertion (Exercise) 11 11 11 12     Symptoms None None None None    Comments Pt's first day in the CRP2 program Reviwed METs and Home exercise Rx Reviewed METs and goals Pt moved to Ellipitical, Reviewed METs    Duration Continue with 30 min of aerobic exercise without signs/symptoms of physical distress. Continue with 30 min of aerobic exercise without signs/symptoms of physical distress. Continue with 30 min of aerobic exercise without signs/symptoms of physical distress. Continue with 30 min of aerobic exercise without signs/symptoms of physical distress.    Intensity THRR unchanged THRR unchanged THRR unchanged THRR unchanged      Progression   Progression Continue to progress workloads to maintain intensity without signs/symptoms of physical distress. Continue to progress workloads to maintain intensity without signs/symptoms of physical distress. Continue to progress workloads to maintain intensity without signs/symptoms of physical distress. Continue to progress workloads to maintain intensity without signs/symptoms of physical distress.    Average METs 2.4 2.7 2.85 4.55      Resistance Training   Training Prescription Yes Yes No Yes    Weight 4 lbs 5 lbs No weights on wednesdyas 5 lbs    Reps 10-15 10-15 -- 10-15    Time 10 Minutes 10 Minutes -- 5 Minutes      Interval Training   Interval Training No No No No      Bike   Level -- 3 4 5     Watts 19.5 25 29.5 40.4    Minutes 15 15 15 15     METs 2.59 2.79 2.95 3.31      Recumbant Elliptical   Level 1 3 4  --    RPM 54 57 50 --    Watts 64 68 67 --    Minutes 15 15 15  --    METs 2.2 2.6 2.7 --      Elliptical   Level -- -- -- 1    Speed -- -- -- 1     Minutes -- -- -- 15    METs -- -- -- 5.1      Home Exercise Plan   Plans to continue exercise at -- Home (comment) Home (comment) --    Frequency -- Add 2 additional days to program exercise sessions. Add 2 additional days to program exercise sessions. --    Initial Home Exercises Provided -- 05/21/23 05/21/23 --             Exercise Comments:   Exercise Comments     Row Name 05/03/23 1133 05/21/23 1131 06/02/23 1220 06/21/23 1159     Exercise Comments Pt's first day in the CRP2 program. Pt completed session with no complaints. Reviewed METs and home exercise Rx today. Pt is exercising at home on his stationary bike for 30 minutes. Pt will continuw with  this. Pt verbalized understnading of the home exercise Rx and was provided a copy. Pt voices that he has been doing yard work again and the plan is for him to resume using his Elliptical at home, which is a goal. Pt is making slow progress. Reviewed METs and Goals with pt. Pt increased avg METs from 2.6 to 3.8. Also moved pt from Recumebent Ellipitical to Upright Elippitical to better align with goals and progress in program. Pt tolerated change well.             Exercise Goals and Review:   Exercise Goals     Row Name 04/30/23 0902             Exercise Goals   Increase Physical Activity Yes       Intervention Provide advice, education, support and counseling about physical activity/exercise needs.;Develop an individualized exercise prescription for aerobic and resistive training based on initial evaluation findings, risk stratification, comorbidities and participant's personal goals.       Expected Outcomes Short Term: Attend rehab on a regular basis to increase amount of physical activity.;Long Term: Exercising regularly at least 3-5 days a week.;Long Term: Add in home exercise to make exercise part of routine and to increase amount of physical activity.       Increase Strength and Stamina Yes       Intervention Provide  advice, education, support and counseling about physical activity/exercise needs.;Develop an individualized exercise prescription for aerobic and resistive training based on initial evaluation findings, risk stratification, comorbidities and participant's personal goals.       Expected Outcomes Short Term: Increase workloads from initial exercise prescription for resistance, speed, and METs.;Long Term: Improve cardiorespiratory fitness, muscular endurance and strength as measured by increased METs and functional capacity ( );Short Term: Perform resistance training exercises routinely during rehab and add in resistance training at home       Able to understand and use rate of perceived exertion (RPE) scale Yes       Intervention Provide education and explanation on how to use RPE scale       Expected Outcomes Short Term: Able to use RPE daily in rehab to express subjective intensity level;Long Term:  Able to use RPE to guide intensity level when exercising independently       Knowledge and understanding of Target Heart Rate Range (THRR) Yes       Intervention Provide education and explanation of THRR including how the numbers were predicted and where they are located for reference       Expected Outcomes Short Term: Able to state/look up THRR;Short Term: Able to use daily as guideline for intensity in rehab;Long Term: Able to use THRR to govern intensity when exercising independently       Understanding of Exercise Prescription Yes       Intervention Provide education, explanation, and written materials on patient's individual exercise prescription       Expected Outcomes Short Term: Able to explain program exercise prescription;Long Term: Able to explain home exercise prescription to exercise independently                Exercise Goals Re-Evaluation :  Exercise Goals Re-Evaluation     Row Name 05/03/23 1132 06/02/23 1218 06/21/23 0828         Exercise Goal Re-Evaluation   Exercise Goals  Review Increase Physical Activity;Increase Strength and Stamina;Able to understand and use rate of perceived exertion (RPE) scale;Knowledge and understanding of Target Heart Rate Range (THRR);Understanding  of Exercise Prescription Increase Physical Activity;Increase Strength and Stamina;Able to understand and use rate of perceived exertion (RPE) scale;Knowledge and understanding of Target Heart Rate Range (THRR);Understanding of Exercise Prescription Increase Physical Activity;Increase Strength and Stamina;Able to understand and use rate of perceived exertion (RPE) scale;Knowledge and understanding of Target Heart Rate Range (THRR);Understanding of Exercise Prescription     Comments Pt's first day in the CRP2 program. Pt understands the exercise Rx, RPE scale and THRR. Reviewed METs and goals. Pt voices he is making progress on his goal of increased strength and stamina. Pt is back to playing golf which was is a goal. Pt also has goal of weight loss and has lost 4.3 kg to date. Reviewed METs and Goals with pt. Pt increased avg METs from 2.6 to 3.8. Also moved pt from Recumebent Ellipitical to Upright Elippitical to better align with goals and progress in program. Pt tolerated change well.     Expected Outcomes Will continue to monitor patient and progress exercise workloads as tolerated. Will continue to monitor patient and progress exercise workloads as tolerated. Will continue to monitor patient and progress exercise workloads as tolerated.              Discharge Exercise Prescription (Final Exercise Prescription Changes):  Exercise Prescription Changes - 06/21/23 1100       Response to Exercise   Blood Pressure (Admit) 104/60    Blood Pressure (Exit) 104/70    Heart Rate (Admit) 60 bpm    Heart Rate (Exercise) 106 bpm    Heart Rate (Exit) 69 bpm    Rating of Perceived Exertion (Exercise) 12    Symptoms None    Comments Pt moved to Ellipitical, Reviewed METs    Duration Continue with 30 min  of aerobic exercise without signs/symptoms of physical distress.    Intensity THRR unchanged      Progression   Progression Continue to progress workloads to maintain intensity without signs/symptoms of physical distress.    Average METs 4.55      Resistance Training   Training Prescription Yes    Weight 5 lbs    Reps 10-15    Time 5 Minutes      Interval Training   Interval Training No      Bike   Level 5    Watts 40.4    Minutes 15    METs 3.31      Elliptical   Level 1    Speed 1    Minutes 15    METs 5.1             Nutrition:  Target Goals: Understanding of nutrition guidelines, daily intake of sodium 1500mg , cholesterol 200mg , calories 30% from fat and 7% or less from saturated fats, daily to have 5 or more servings of fruits and vegetables.  Biometrics:  Pre Biometrics - 04/30/23 0857       Pre Biometrics   Waist Circumference 44 inches    Hip Circumference 45 inches    Waist to Hip Ratio 0.98 %    Triceps Skinfold 18 mm    % Body Fat 30.8 %    Grip Strength 44 kg    Flexibility 14 in    Single Leg Stand 30 seconds              Nutrition Therapy Plan and Nutrition Goals:  Nutrition Therapy & Goals - 06/04/23 0946       Nutrition Therapy   Diet Heart Healthy Diet  Drug/Food Interactions Statins/Certain Fruits      Personal Nutrition Goals   Nutrition Goal Patient to identify strategies for reducing cardiovascular risk by attending the [employer] education and nutrition series weekly.   goal in action.   Personal Goal #2 Patient to improve diet quality by using the plate method as a guide for meal planning to include lean protein/plant protein, fruits, vegetables, whole grains, nonfat dairy as part of a well-balanced diet.   goal in action.   Personal Goal #3 Patient to identify strategies for weight loss of 0.5-2.0# per week.   goal in action.   Comments Goals in action. Zacharius has medical history of STEMI, HTN, hyperlipidemia, CAD. He  has previously completed cardiac rehab in the past. He continues to attend the [facility] education/nutrition series regularly He reports using herbalife protein shake and tea for breakfast/snacks. He is down 9.5# since starting with our program and is motivated to lose to ~205#. LDL is not at goal <55; he continues zetia , crestor but will consider PCSK9-i. Patient will benefit from participation in intensive cardiac rehab for nutrition, exercise, and lifestyle modification.      Intervention Plan   Intervention Prescribe, educate and counsel regarding individualized specific dietary modifications aiming towards targeted core components such as weight, hypertension, lipid management, diabetes, heart failure and other comorbidities.;Nutrition handout(s) given to patient.    Expected Outcomes Short Term Goal: Understand basic principles of dietary content, such as calories, fat, sodium, cholesterol and nutrients.;Long Term Goal: Adherence to prescribed nutrition plan.             Nutrition Assessments:  Nutrition Assessments - 05/07/23 1132       Rate Your Plate Scores   Pre Score 43            MEDIFICTS Score Key: >=70 Need to make dietary changes  40-70 Heart Healthy Diet <= 40 Therapeutic Level Cholesterol Diet   Flowsheet Row INTENSIVE CARDIAC REHAB from 05/07/2023 in [facility] for Heart, Vascular, & Lung Health  Picture Your Plate Total Score on Admission 43      Picture Your Plate Scores: <46 Unhealthy dietary pattern with much room for improvement. 41-50 Dietary pattern unlikely to meet recommendations for good health and room for improvement. 51-60 More healthful dietary pattern, with some room for improvement.  >60 Healthy dietary pattern, although there may be some specific behaviors that could be improved.    Nutrition Goals Re-Evaluation:  Nutrition Goals Re-Evaluation     Row Name 05/03/23 1020 06/04/23 0946           Goals   Current  Weight 227 lb 4.7 oz (103.1 kg) 216 lb 4.3 oz (98.1 kg)      Comment LDL 68, HDL 35, A1c 5.7, Lpa 100.5 no new labs; most recent labs LDL 68, HDL 35, A1c 5.7, Lpa 100.5      Expected Outcome Starling has medical history of STEMI, HTN, hyperlipidemia, CAD. He has previously completed cardiac rehab in the past. He reports using herbalife protein shake and tea for breakfast. LDL is not at goal <55; he continues zetia , crestor but will consider PCSK9-i. Patient will benefit from participation in intensive cardiac rehab for nutrition, exercise, and lifestyle modification. Goals in action. Modesto has medical history of STEMI, HTN, hyperlipidemia, CAD. He has previously completed cardiac rehab in the past. He continues to attend the [facility] education/nutrition series regularly He reports using herbalife protein shake and tea for breakfast/snacks. He is down 9.5# since starting  with our program and is motivated to lose to ~205#. LDL is not at goal <55; he continues zetia , crestor but will consider PCSK9-i. Patient will benefit from participation in intensive cardiac rehab for nutrition, exercise, and lifestyle modification.               Nutrition Goals Re-Evaluation:  Nutrition Goals Re-Evaluation     Row Name 05/03/23 1020 06/04/23 0946           Goals   Current Weight 227 lb 4.7 oz (103.1 kg) 216 lb 4.3 oz (98.1 kg)      Comment LDL 68, HDL 35, A1c 5.7, Lpa 100.5 no new labs; most recent labs LDL 68, HDL 35, A1c 5.7, Lpa 100.5      Expected Outcome Myka has medical history of STEMI, HTN, hyperlipidemia, CAD. He has previously completed cardiac rehab in the past. He reports using herbalife protein shake and tea for breakfast. LDL is not at goal <55; he continues zetia , crestor but will consider PCSK9-i. Patient will benefit from participation in intensive cardiac rehab for nutrition, exercise, and lifestyle modification. Goals in action. Leocadio has medical history of STEMI, HTN, hyperlipidemia,  CAD. He has previously completed cardiac rehab in the past. He continues to attend the [facility] education/nutrition series regularly He reports using herbalife protein shake and tea for breakfast/snacks. He is down 9.5# since starting with our program and is motivated to lose to ~205#. LDL is not at goal <55; he continues zetia , crestor but will consider PCSK9-i. Patient will benefit from participation in intensive cardiac rehab for nutrition, exercise, and lifestyle modification.               Nutrition Goals Discharge (Final Nutrition Goals Re-Evaluation):  Nutrition Goals Re-Evaluation - 06/04/23 0946       Goals   Current Weight 216 lb 4.3 oz (98.1 kg)    Comment no new labs; most recent labs LDL 68, HDL 35, A1c 5.7, Lpa 100.5    Expected Outcome Goals in action. Kenzo has medical history of STEMI, HTN, hyperlipidemia, CAD. He has previously completed cardiac rehab in the past. He continues to attend the [facility] education/nutrition series regularly He reports using herbalife protein shake and tea for breakfast/snacks. He is down 9.5# since starting with our program and is motivated to lose to ~205#. LDL is not at goal <55; he continues zetia , crestor but will consider PCSK9-i. Patient will benefit from participation in intensive cardiac rehab for nutrition, exercise, and lifestyle modification.             Psychosocial: Target Goals: Acknowledge presence or absence of significant depression and/or stress, maximize coping skills, provide positive support system. Participant is able to verbalize types and ability to use techniques and skills needed for reducing stress and depression.  Initial Review & Psychosocial Screening:  Initial Psych Review & Screening - 04/30/23 0902       Initial Review   Current issues with None Identified      Family Dynamics   Good Support System? Yes   Wife for support   Comments Auguste denies any feelings of depression/anxiety/stress. He shared  that he often has to get up several times a night to use the restroom and that is disruptive to his sleep, however his medications he takes to help him sleep at night are working well for him. He denies any need for additional resources at this time.      Barriers   Psychosocial barriers to participate in program  There are no identifiable barriers or psychosocial needs.      Screening Interventions   Interventions Encouraged to exercise;Provide feedback about the scores to participant    Expected Outcomes Short Term goal: Identification and review with participant of any Quality of Life or Depression concerns found by scoring the questionnaire.;Long Term goal: The participant improves quality of Life and PHQ9 Scores as seen by post scores and/or verbalization of changes;Long Term Goal: Stressors or current issues are controlled or eliminated.             Quality of Life Scores:  Quality of Life - 04/30/23 1024       Quality of Life   Select Quality of Life      Quality of Life Scores   Health/Function Pre 23.86 %    Socioeconomic Pre 23.14 %    Psych/Spiritual Pre 24.86 %    Family Pre 24.6 %    GLOBAL Pre 24.03 %            Scores of 19 and below usually indicate a poorer quality of life in these areas.  A difference of  2-3 points is a clinically meaningful difference.  A difference of 2-3 points in the total score of the Quality of Life Index has been associated with significant improvement in overall quality of life, self-image, physical symptoms, and general health in studies assessing change in quality of life.  PHQ-9: Review Flowsheet       04/30/2023 07/24/2013 04/24/2013  Depression screen PHQ 2/9  Decreased Interest 0 0 0  Down, Depressed, Hopeless 0 0 0  PHQ - 2 Score 0 0 0  Altered sleeping 0 - -  Tired, decreased energy 0 - -  Change in appetite 0 - -  Feeling bad or failure about yourself  0 - -  Trouble concentrating 0 - -  Moving slowly or  fidgety/restless 0 - -  Suicidal thoughts 0 - -  PHQ-9 Score 0 - -   Interpretation of Total Score  Total Score Depression Severity:  1-4 = Minimal depression, 5-9 = Mild depression, 10-14 = Moderate depression, 15-19 = Moderately severe depression, 20-27 = Severe depression   Psychosocial Evaluation and Intervention:   Psychosocial Re-Evaluation:  Psychosocial Re-Evaluation     Row Name 05/03/23 0955 05/20/23 1346 06/21/23 0925         Psychosocial Re-Evaluation   Current issues with None Identified None Identified None Identified     Interventions Encouraged to attend Cardiac Rehabilitation for the exercise Encouraged to attend Cardiac Rehabilitation for the exercise Encouraged to attend Cardiac Rehabilitation for the exercise     Continue Psychosocial Services  No Follow up required No Follow up required No Follow up required              Psychosocial Discharge (Final Psychosocial Re-Evaluation):  Psychosocial Re-Evaluation - 06/21/23 0925       Psychosocial Re-Evaluation   Current issues with None Identified    Interventions Encouraged to attend Cardiac Rehabilitation for the exercise    Continue Psychosocial Services  No Follow up required             Vocational Rehabilitation: Provide vocational rehab assistance to qualifying candidates.   Vocational Rehab Evaluation & Intervention:  Vocational Rehab - 04/30/23 0904       Initial Vocational Rehab Evaluation & Intervention   Assessment shows need for Vocational Rehabilitation No   Issachar is retired.  Education: Education Goals: Education classes will be provided on a weekly basis, covering required topics. Participant will state understanding/return demonstration of topics presented.    Education     Row Name 05/03/23 0900     Education   Cardiac Education Topics Pritikin   Select Core Videos     Core Videos   Educator Exercise Physiologist   Select Exercise Education    Exercise Education Biomechanial Limitations   Instruction Review Code 1- Verbalizes Understanding   Class Start Time 0808   Class Stop Time 0841   Class Time Calculation (min) 33 min    Row Name 05/05/23 0900     Education   Cardiac Education Topics Pritikin   Secondary school teacher School   Educator Dietitian   Weekly Topic Fast Evening Meals   Instruction Review Code 1- Verbalizes Understanding   Class Start Time 0815   Class Stop Time [id number]   Class Time Calculation (min) 38 min    Row Name 05/07/23 0900     Education   Cardiac Education Topics Pritikin   Select Core Videos     Core Videos   Educator Dietitian   Select Nutrition   Nutrition Vitamins and Minerals   Instruction Review Code 1- Verbalizes Understanding   Class Start Time 0815   Class Stop Time 0857   Class Time Calculation (min) 42 min    Row Name 05/10/23 0800     Education   Cardiac Education Topics Pritikin   Select Core Videos     Core Videos   Educator Dietitian   Select Nutrition   Nutrition Fueling a Healthy Body   Instruction Review Code 1- Verbalizes Understanding   Class Start Time 0815   Class Stop Time 0900   Class Time Calculation (min) 45 min    Row Name 05/12/23 0900     Education   Cardiac Education Topics Pritikin   Customer service manager   Weekly Topic International Cuisine- Spotlight on the [facility] Zones   Instruction Review Code 1- Verbalizes Understanding   Class Start Time 0815   Class Stop Time 0850   Class Time Calculation (min) 35 min    Row Name 05/14/23 0800     Education   Cardiac Education Topics Pritikin   Select Core Videos     Core Videos   Educator Exercise Physiologist   Select Exercise Education   Exercise Education Improving Performance   Instruction Review Code 1- Verbalizes Understanding   Class Start Time [phone]   Class Stop Time 0848   Class Time Calculation (min) 37 min    Row Name 05/17/23  0900     Education   Cardiac Education Topics Pritikin   Select Workshops     Workshops   Educator Exercise Physiologist   Select Psychosocial   Psychosocial Workshop Healthy Sleep for a Healthy Heart   Instruction Review Code 1- Verbalizes Understanding   Class Start Time [phone]   Class Stop Time 0858   Class Time Calculation (min) 46 min    Row Name 05/24/23 0800     Education   Cardiac Education Topics Pritikin   Select Workshops     Workshops   Educator Exercise Physiologist   Select Exercise   Exercise Workshop Managing Heart Disease: Your Path to a Healthier Heart   Instruction Review Code 1- Verbalizes Understanding   Class Start Time [phone]   Class Stop  Time 0900   Class Time Calculation (min) 48 min    Row Name 05/26/23 0900     Education   Cardiac Education Topics Pritikin   Secondary school teacher School   Educator Exercise Physiologist   Weekly Topic Efficiency Cooking - Meals in a Snap   Instruction Review Code 1- Verbalizes Understanding   Class Start Time 0815   Class Stop Time 0855   Class Time Calculation (min) 40 min    Row Name 05/28/23 0800     Education   Cardiac Education Topics Pritikin   Select Core Videos     Core Videos   Educator Exercise Physiologist   Select General Education   General Education Hypertension and Heart Disease   Instruction Review Code 1- Verbalizes Understanding   Class Start Time [phone]   Class Stop Time 0847   Class Time Calculation (min) 35 min    Row Name 05/31/23 0800     Education   Cardiac Education Topics Pritikin   Geographical information systems officer Psychosocial   Psychosocial Workshop From Head to Heart: The Power of a Healthy Outlook   Instruction Review Code 1- Verbalizes Understanding   Class Start Time 0815   Class Stop Time 0900   Class Time Calculation (min) 45 min    Row Name 06/02/23 0800     Education   Cardiac Education Topics  Pritikin   Secondary school teacher School   Educator Dietitian   Weekly Topic Tasty Appetizers and Snacks   Instruction Review Code 1- Verbalizes Understanding   Class Start Time 0815   Class Stop Time 0855   Class Time Calculation (min) 40 min    Row Name 06/04/23 0800     Education   Cardiac Education Topics Pritikin   Psychologist, forensic General Education   General Education Heart Disease Risk Reduction   Instruction Review Code 1- Verbalizes Understanding   Class Start Time 0813   Class Stop Time 0850   Class Time Calculation (min) 37 min    Row Name 06/07/23 0800     Education   Cardiac Education Topics Pritikin   Select Core Videos     Core Videos   Educator Exercise Physiologist   Select Psychosocial   Psychosocial Healthy Minds, Bodies, Hearts   Instruction Review Code 1- Verbalizes Understanding   Class Start Time 0815   Class Stop Time 0850   Class Time Calculation (min) 35 min    Row Name 06/09/23 0800     Education   Cardiac Education Topics Pritikin   Secondary school teacher School   Educator Dietitian   Weekly Topic Adding Flavor - Sodium-Free   Instruction Review Code 1- Verbalizes Understanding   Class Start Time 0815   Class Stop Time 0855   Class Time Calculation (min) 40 min    Row Name 06/11/23 0900     Education   Cardiac Education Topics Pritikin   Select Workshops     Workshops   Educator Exercise Physiologist   Select Exercise   Exercise Workshop Location manager and Fall Prevention   Instruction Review Code 1- Verbalizes Understanding   Class Start Time 0810   Class Stop Time 0850   Class Time Calculation (min) 40 min    Row  Name 06/21/23 0800     Education   Cardiac Education Topics Pritikin   Select Core Videos     Core Videos   Educator Exercise Physiologist   Select General Education   General Education Metabolic Syndrome and Belly  Fat   Instruction Review Code 1- Verbalizes Understanding   Class Start Time 0815   Class Stop Time 0851   Class Time Calculation (min) 36 min            Core Videos: Exercise    Move It!  Clinical staff conducted group or individual video education with verbal and written material and guidebook.  Patient learns the recommended Pritikin exercise program. Exercise with the goal of living a long, healthy life. Some of the health benefits of exercise include controlled diabetes, healthier blood pressure levels, improved cholesterol levels, improved heart and lung capacity, improved sleep, and better body composition. Everyone should speak with their doctor before starting or changing an exercise routine.  Biomechanical Limitations Clinical staff conducted group or individual video education with verbal and written material and guidebook.  Patient learns how biomechanical limitations can impact exercise and how we can mitigate and possibly overcome limitations to have an impactful and balanced exercise routine.  Body Composition Clinical staff conducted group or individual video education with verbal and written material and guidebook.  Patient learns that body composition (ratio of muscle mass to fat mass) is a key component to assessing overall fitness, rather than body weight alone. Increased fat mass, especially visceral belly fat, can put us  at increased risk for metabolic syndrome, type 2 diabetes, heart disease, and even death. It is recommended to combine diet and exercise (cardiovascular and resistance training) to improve your body composition. Seek guidance from your physician and exercise physiologist before implementing an exercise routine.  Exercise Action Plan Clinical staff conducted group or individual video education with verbal and written material and guidebook.  Patient learns the recommended strategies to achieve and enjoy long-term exercise adherence, including variety,  self-motivation, self-efficacy, and positive decision making. Benefits of exercise include fitness, good health, weight management, more energy, better sleep, less stress, and overall well-being.  Medical   Heart Disease Risk Reduction Clinical staff conducted group or individual video education with verbal and written material and guidebook.  Patient learns our heart is our most vital organ as it circulates oxygen, nutrients, white blood cells, and hormones throughout the entire body, and carries waste away. Data supports a plant-based eating plan like the Pritikin Program for its effectiveness in slowing progression of and reversing heart disease. The video provides a number of recommendations to address heart disease.   Metabolic Syndrome and Belly Fat  Clinical staff conducted group or individual video education with verbal and written material and guidebook.  Patient learns what metabolic syndrome is, how it leads to heart disease, and how one can reverse it and keep it from coming back. You have metabolic syndrome if you have 3 of the following 5 criteria: abdominal obesity, high blood pressure, high triglycerides, low HDL cholesterol, and high blood sugar.  Hypertension and Heart Disease Clinical staff conducted group or individual video education with verbal and written material and guidebook.  Patient learns that high blood pressure, or hypertension, is very common in the [place] . Hypertension is largely due to excessive salt intake, but other important risk factors include being overweight, physical inactivity, drinking too much alcohol, smoking, and not eating enough potassium from fruits and vegetables. High blood pressure is a leading  risk factor for heart attack, stroke, congestive heart failure, dementia, kidney failure, and premature death. Long-term effects of excessive salt intake include stiffening of the arteries and thickening of heart muscle and organ damage. Recommendations  include ways to reduce hypertension and the risk of heart disease.  Diseases of Our Time - Focusing on Diabetes Clinical staff conducted group or individual video education with verbal and written material and guidebook.  Patient learns why the best way to stop diseases of our time is prevention, through food and other lifestyle changes. Medicine (such as prescription pills and surgeries) is often only a Band-Aid on the problem, not a long-term solution. Most common diseases of our time include obesity, type 2 diabetes, hypertension, heart disease, and cancer. The Pritikin Program is recommended and has been proven to help reduce, reverse, and/or prevent the damaging effects of metabolic syndrome.  Nutrition   Overview of the Pritikin Eating Plan  Clinical staff conducted group or individual video education with verbal and written material and guidebook.  Patient learns about the Pritikin Eating Plan for disease risk reduction. The Pritikin Eating Plan emphasizes a wide variety of unrefined, minimally-processed carbohydrates, like fruits, vegetables, whole grains, and legumes. Go, Caution, and Stop food choices are explained. Plant-based and lean animal proteins are emphasized. Rationale provided for low sodium intake for blood pressure control, low added sugars for blood sugar stabilization, and low added fats and oils for coronary artery disease risk reduction and weight management.  Calorie Density  Clinical staff conducted group or individual video education with verbal and written material and guidebook.  Patient learns about calorie density and how it impacts the Pritikin Eating Plan. Knowing the characteristics of the food you choose will help you decide whether those foods will lead to weight gain or weight loss, and whether you want to consume more or less of them. Weight loss is usually a side effect of the Pritikin Eating Plan because of its focus on low calorie-dense foods.  Label Reading   Clinical staff conducted group or individual video education with verbal and written material and guidebook.  Patient learns about the Pritikin recommended label reading guidelines and corresponding recommendations regarding calorie density, added sugars, sodium content, and whole grains.  Dining Out - Part 1  Clinical staff conducted group or individual video education with verbal and written material and guidebook.  Patient learns that restaurant meals can be sabotaging because they can be so high in calories, fat, sodium, and/or sugar. Patient learns recommended strategies on how to positively address this and avoid unhealthy pitfalls.  Facts on Fats  Clinical staff conducted group or individual video education with verbal and written material and guidebook.  Patient learns that lifestyle modifications can be just as effective, if not more so, as many medications for lowering your risk of heart disease. A Pritikin lifestyle can help to reduce your risk of inflammation and atherosclerosis (cholesterol build-up, or plaque, in the artery walls). Lifestyle interventions such as dietary choices and physical activity address the cause of atherosclerosis. A review of the types of fats and their impact on blood cholesterol levels, along with dietary recommendations to reduce fat intake is also included.  Nutrition Action Plan  Clinical staff conducted group or individual video education with verbal and written material and guidebook.  Patient learns how to incorporate Pritikin recommendations into their lifestyle. Recommendations include planning and keeping personal health goals in mind as an important part of their success.  Healthy Mind-Set    Healthy Minds,  Bodies, Hearts  Clinical staff conducted group or individual video education with verbal and written material and guidebook.  Patient learns how to identify when they are stressed. Video will discuss the impact of that stress, as well as the  many benefits of stress management. Patient will also be introduced to stress management techniques. The way we think, act, and feel has an impact on our hearts.  How Our Thoughts Can Heal Our Hearts  Clinical staff conducted group or individual video education with verbal and written material and guidebook.  Patient learns that negative thoughts can cause depression and anxiety. This can result in negative lifestyle behavior and serious health problems. Cognitive behavioral therapy is an effective method to help control our thoughts in order to change and improve our emotional outlook.  Additional Videos:  Exercise    Improving Performance  Clinical staff conducted group or individual video education with verbal and written material and guidebook.  Patient learns to use a non-linear approach by alternating intensity levels and lengths of time spent exercising to help burn more calories and lose more body fat. Cardiovascular exercise helps improve heart health, metabolism, hormonal balance, blood sugar control, and recovery from fatigue. Resistance training improves strength, endurance, balance, coordination, reaction time, metabolism, and muscle mass. Flexibility exercise improves circulation, posture, and balance. Seek guidance from your physician and exercise physiologist before implementing an exercise routine and learn your capabilities and proper form for all exercise.  Introduction to Yoga  Clinical staff conducted group or individual video education with verbal and written material and guidebook.  Patient learns about yoga, a discipline of the coming together of mind, breath, and body. The benefits of yoga include improved flexibility, improved range of motion, better posture and core strength, increased lung function, weight loss, and positive self-image. Yoga's heart health benefits include lowered blood pressure, healthier heart rate, decreased cholesterol and triglyceride levels, improved  immune function, and reduced stress. Seek guidance from your physician and exercise physiologist before implementing an exercise routine and learn your capabilities and proper form for all exercise.  Medical   Aging: Enhancing Your Quality of Life  Clinical staff conducted group or individual video education with verbal and written material and guidebook.  Patient learns key strategies and recommendations to stay in good physical health and enhance quality of life, such as prevention strategies, having an advocate, securing a Health Care Proxy and Power of Attorney, and keeping a list of medications and system for tracking them. It also discusses how to avoid risk for bone loss.  Biology of Weight Control  Clinical staff conducted group or individual video education with verbal and written material and guidebook.  Patient learns that weight gain occurs because we consume more calories than we burn (eating more, moving less). Even if your body weight is normal, you may have higher ratios of fat compared to muscle mass. Too much body fat puts you at increased risk for cardiovascular disease, heart attack, stroke, type 2 diabetes, and obesity-related cancers. In addition to exercise, following the Pritikin Eating Plan can help reduce your risk.  Decoding Lab Results  Clinical staff conducted group or individual video education with verbal and written material and guidebook.  Patient learns that lab test reflects one measurement whose values change over time and are influenced by many factors, including medication, stress, sleep, exercise, food, hydration, pre-existing medical conditions, and more. It is recommended to use the knowledge from this video to become more involved with your lab results and evaluate  your numbers to speak with your doctor.   Diseases of Our Time - Overview  Clinical staff conducted group or individual video education with verbal and written material and guidebook.  Patient  learns that according to the CDC, 50% to 70% of chronic diseases (such as obesity, type 2 diabetes, elevated lipids, hypertension, and heart disease) are avoidable through lifestyle improvements including healthier food choices, listening to satiety cues, and increased physical activity.  Sleep Disorders Clinical staff conducted group or individual video education with verbal and written material and guidebook.  Patient learns how good quality and duration of sleep are important to overall health and well-being. Patient also learns about sleep disorders and how they impact health along with recommendations to address them, including discussing with a physician.  Nutrition  Dining Out - Part 2 Clinical staff conducted group or individual video education with verbal and written material and guidebook.  Patient learns how to plan ahead and communicate in order to maximize their dining experience in a healthy and nutritious manner. Included are recommended food choices based on the type of restaurant the patient is visiting.   Fueling a Banker conducted group or individual video education with verbal and written material and guidebook.  There is a strong connection between our food choices and our health. Diseases like obesity and type 2 diabetes are very prevalent and are in large-part due to lifestyle choices. The Pritikin Eating Plan provides plenty of food and hunger-curbing satisfaction. It is easy to follow, affordable, and helps reduce health risks.  Menu Workshop  Clinical staff conducted group or individual video education with verbal and written material and guidebook.  Patient learns that restaurant meals can sabotage health goals because they are often packed with calories, fat, sodium, and sugar. Recommendations include strategies to plan ahead and to communicate with the manager, chef, or server to help order a healthier meal.  Planning Your Eating Strategy   Clinical staff conducted group or individual video education with verbal and written material and guidebook.  Patient learns about the Pritikin Eating Plan and its benefit of reducing the risk of disease. The Pritikin Eating Plan does not focus on calories. Instead, it emphasizes high-quality, nutrient-rich foods. By knowing the characteristics of the foods, we choose, we can determine their calorie density and make informed decisions.  Targeting Your Nutrition Priorities  Clinical staff conducted group or individual video education with verbal and written material and guidebook.  Patient learns that lifestyle habits have a tremendous impact on disease risk and progression. This video provides eating and physical activity recommendations based on your personal health goals, such as reducing LDL cholesterol, losing weight, preventing or controlling type 2 diabetes, and reducing high blood pressure.  Vitamins and Minerals  Clinical staff conducted group or individual video education with verbal and written material and guidebook.  Patient learns different ways to obtain key vitamins and minerals, including through a recommended healthy diet. It is important to discuss all supplements you take with your doctor.   Healthy Mind-Set    Smoking Cessation  Clinical staff conducted group or individual video education with verbal and written material and guidebook.  Patient learns that cigarette smoking and tobacco addiction pose a serious health risk which affects millions of people. Stopping smoking will significantly reduce the risk of heart disease, lung disease, and many forms of cancer. Recommended strategies for quitting are covered, including working with your doctor to develop a successful plan.  Culinary   Becoming  a Pritikin [place] conducted group or individual video education with verbal and written material and guidebook.  Patient learns that cooking at home can be healthy,  cost-effective, quick, and puts them in control. Keys to cooking healthy recipes will include looking at your recipe, assessing your equipment needs, planning ahead, making it simple, choosing cost-effective seasonal ingredients, and limiting the use of added fats, salts, and sugars.  Cooking - Breakfast and Snacks  Clinical staff conducted group or individual video education with verbal and written material and guidebook.  Patient learns how important breakfast is to satiety and nutrition through the entire day. Recommendations include key foods to eat during breakfast to help stabilize blood sugar levels and to prevent overeating at meals later in the day. Planning ahead is also a key component.  Cooking - Educational psychologist conducted group or individual video education with verbal and written material and guidebook.  Patient learns eating strategies to improve overall health, including an approach to cook more at home. Recommendations include thinking of animal protein as a side on your plate rather than center stage and focusing instead on lower calorie dense options like vegetables, fruits, whole grains, and plant-based proteins, such as beans. Making sauces in large quantities to freeze for later and leaving the skin on your vegetables are also recommended to maximize your experience.  Cooking - Healthy Salads and Dressing Clinical staff conducted group or individual video education with verbal and written material and guidebook.  Patient learns that vegetables, fruits, whole grains, and legumes are the foundations of the Pritikin Eating Plan. Recommendations include how to incorporate each of these in flavorful and healthy salads, and how to create homemade salad dressings. Proper handling of ingredients is also covered. Cooking - Soups and [employer] - Soups and Desserts Clinical staff conducted group or individual video education with verbal and written material and  guidebook.  Patient learns that Pritikin soups and desserts make for easy, nutritious, and delicious snacks and meal components that are low in sodium, fat, sugar, and calorie density, while high in vitamins, minerals, and filling fiber. Recommendations include simple and healthy ideas for soups and desserts.   Overview     The Pritikin Solution Program Overview Clinical staff conducted group or individual video education with verbal and written material and guidebook.  Patient learns that the results of the Pritikin Program have been documented in more than 100 articles published in peer-reviewed journals, and the benefits include reducing risk factors for (and, in some cases, even reversing) high cholesterol, high blood pressure, type 2 diabetes, obesity, and more! An overview of the three key pillars of the Pritikin Program will be covered: eating well, doing regular exercise, and having a healthy mind-set.  WORKSHOPS  Exercise: Exercise Basics: Building Your Action Plan Clinical staff led group instruction and group discussion with PowerPoint presentation and patient guidebook. To enhance the learning environment the use of posters, models and videos may be added. At the conclusion of this workshop, patients will comprehend the difference between physical activity and exercise, as well as the benefits of incorporating both, into their routine. Patients will understand the FITT (Frequency, Intensity, Time, and Type) principle and how to use it to build an exercise action plan. In addition, safety concerns and other considerations for exercise and cardiac rehab will be addressed by the presenter. The purpose of this lesson is to promote a comprehensive and effective weekly exercise routine in order to improve patients'  overall level of fitness.   Managing Heart Disease: Your Path to a Healthier Heart Clinical staff led group instruction and group discussion with PowerPoint presentation and  patient guidebook. To enhance the learning environment the use of posters, models and videos may be added.At the conclusion of this workshop, patients will understand the anatomy and physiology of the heart. Additionally, they will understand how Pritikin's three pillars impact the risk factors, the progression, and the management of heart disease.  The purpose of this lesson is to provide a high-level overview of the heart, heart disease, and how the Pritikin lifestyle positively impacts risk factors.  Exercise Biomechanics Clinical staff led group instruction and group discussion with PowerPoint presentation and patient guidebook. To enhance the learning environment the use of posters, models and videos may be added. Patients will learn how the structural parts of their bodies function and how these functions impact their daily activities, movement, and exercise. Patients will learn how to promote a neutral spine, learn how to manage pain, and identify ways to improve their physical movement in order to promote healthy living. The purpose of this lesson is to expose patients to common physical limitations that impact physical activity. Participants will learn practical ways to adapt and manage aches and pains, and to minimize their effect on regular exercise. Patients will learn how to maintain good posture while sitting, walking, and lifting.  Balance Training and Fall Prevention  Clinical staff led group instruction and group discussion with PowerPoint presentation and patient guidebook. To enhance the learning environment the use of posters, models and videos may be added. At the conclusion of this workshop, patients will understand the importance of their sensorimotor skills (vision, proprioception, and the vestibular system) in maintaining their ability to balance as they age. Patients will apply a variety of balancing exercises that are appropriate for their current level of function.  Patients will understand the common causes for poor balance, possible solutions to these problems, and ways to modify their physical environment in order to minimize their fall risk. The purpose of this lesson is to teach patients about the importance of maintaining balance as they age and ways to minimize their risk of falling.  WORKSHOPS   Nutrition:  Fueling a Ship broker led group instruction and group discussion with PowerPoint presentation and patient guidebook. To enhance the learning environment the use of posters, models and videos may be added. Patients will review the foundational principles of the Pritikin Eating Plan and understand what constitutes a serving size in each of the food groups. Patients will also learn Pritikin-friendly foods that are better choices when away from home and review make-ahead meal and snack options. Calorie density will be reviewed and applied to three nutrition priorities: weight maintenance, weight loss, and weight gain. The purpose of this lesson is to reinforce (in a group setting) the key concepts around what patients are recommended to eat and how to apply these guidelines when away from home by planning and selecting Pritikin-friendly options. Patients will understand how calorie density may be adjusted for different weight management goals.  Mindful Eating  Clinical staff led group instruction and group discussion with PowerPoint presentation and patient guidebook. To enhance the learning environment the use of posters, models and videos may be added. Patients will briefly review the concepts of the Pritikin Eating Plan and the importance of low-calorie dense foods. The concept of mindful eating will be introduced as well as the importance of paying attention to internal hunger  signals. Triggers for non-hunger eating and techniques for dealing with triggers will be explored. The purpose of this lesson is to provide patients with the  opportunity to review the basic principles of the Pritikin Eating Plan, discuss the value of eating mindfully and how to measure internal cues of hunger and fullness using the Hunger Scale. Patients will also discuss reasons for non-hunger eating and learn strategies to use for controlling emotional eating.  Targeting Your Nutrition Priorities Clinical staff led group instruction and group discussion with PowerPoint presentation and patient guidebook. To enhance the learning environment the use of posters, models and videos may be added. Patients will learn how to determine their genetic susceptibility to disease by reviewing their family history. Patients will gain insight into the importance of diet as part of an overall healthy lifestyle in mitigating the impact of genetics and other environmental insults. The purpose of this lesson is to provide patients with the opportunity to assess their personal nutrition priorities by looking at their family history, their own health history and current risk factors. Patients will also be able to discuss ways of prioritizing and modifying the Pritikin Eating Plan for their highest risk areas  Menu  Clinical staff led group instruction and group discussion with PowerPoint presentation and patient guidebook. To enhance the learning environment the use of posters, models and videos may be added. Using menus brought in from [employer], or printed from [employer], patients will apply the Pritikin dining out guidelines that were presented in the [employer] video. Patients will also be able to practice these guidelines in a variety of provided scenarios. The purpose of this lesson is to provide patients with the opportunity to practice hands-on learning of the Pritikin Dining Out guidelines with actual menus and practice scenarios.  Label Reading Clinical staff led group instruction and group discussion with PowerPoint presentation and  patient guidebook. To enhance the learning environment the use of posters, models and videos may be added. Patients will review and discuss the Pritikin label reading guidelines presented in Pritikin's Label Reading Educational series video. Using fool labels brought in from local grocery stores and markets, patients will apply the label reading guidelines and determine if the packaged food meet the Pritikin guidelines. The purpose of this lesson is to provide patients with the opportunity to review, discuss, and practice hands-on learning of the Pritikin Label Reading guidelines with actual packaged food labels. Cooking School  Pritikin's [employer] are designed to teach patients ways to prepare quick, simple, and affordable recipes at home. The importance of nutrition's role in chronic disease risk reduction is reflected in its emphasis in the overall Pritikin program. By learning how to prepare essential core Pritikin Eating Plan recipes, patients will increase control over what they eat; be able to customize the flavor of foods without the use of added salt, sugar, or fat; and improve the quality of the food they consume. By learning a set of core recipes which are easily assembled, quickly prepared, and affordable, patients are more likely to prepare more healthy foods at home. These workshops focus on convenient breakfasts, simple entres, side dishes, and desserts which can be prepared with minimal effort and are consistent with nutrition recommendations for cardiovascular risk reduction. Cooking [employer] are taught by a Armed forces logistics/support/administrative officer (RD) who has been trained by the [employer]. The chef or RD has a clear understanding of the importance of minimizing - if not completely eliminating -  added fat, sugar, and sodium in recipes. Throughout the series of Cooking School Workshop sessions, patients will learn about healthy ingredients and efficient methods of  cooking to build confidence in their capability to prepare    Cooking School weekly topics:  Adding Flavor- Sodium-Free  Fast and Healthy Breakfasts  Powerhouse Plant-Based Proteins  Satisfying Salads and Dressings  Simple Sides and Sauces  International Cuisine-Spotlight on the [employer] Zones  Delicious Desserts  Savory Soups  [employer] - Meals in a Astronomer Appetizers and Snacks  Comforting Weekend Breakfasts  One-Pot Wonders   Fast Evening Meals  Landscape architect Your Pritikin Plate  WORKSHOPS   Healthy Mindset (Psychosocial):  Focused Goals, Sustainable Changes Clinical staff led group instruction and group discussion with PowerPoint presentation and patient guidebook. To enhance the learning environment the use of posters, models and videos may be added. Patients will be able to apply effective goal setting strategies to establish at least one personal goal, and then take consistent, meaningful action toward that goal. They will learn to identify common barriers to achieving personal goals and develop strategies to overcome them. Patients will also gain an understanding of how our mind-set can impact our ability to achieve goals and the importance of cultivating a positive and growth-oriented mind-set. The purpose of this lesson is to provide patients with a deeper understanding of how to set and achieve personal goals, as well as the tools and strategies needed to overcome common obstacles which may arise along the way.  From Head to Heart: The Power of a Healthy Outlook  Clinical staff led group instruction and group discussion with PowerPoint presentation and patient guidebook. To enhance the learning environment the use of posters, models and videos may be added. Patients will be able to recognize and describe the impact of emotions and mood on physical health. They will discover the importance of self-care and explore self-care practices which may work  for them. Patients will also learn how to utilize the 4 C's to cultivate a healthier outlook and better manage stress and challenges. The purpose of this lesson is to demonstrate to patients how a healthy outlook is an essential part of maintaining good health, especially as they continue their cardiac rehab journey.  Healthy Sleep for a Healthy Heart Clinical staff led group instruction and group discussion with PowerPoint presentation and patient guidebook. To enhance the learning environment the use of posters, models and videos may be added. At the conclusion of this workshop, patients will be able to demonstrate knowledge of the importance of sleep to overall health, well-being, and quality of life. They will understand the symptoms of, and treatments for, common sleep disorders. Patients will also be able to identify daytime and nighttime behaviors which impact sleep, and they will be able to apply these tools to help manage sleep-related challenges. The purpose of this lesson is to provide patients with a general overview of sleep and outline the importance of quality sleep. Patients will learn about a few of the most common sleep disorders. Patients will also be introduced to the concept of "sleep hygiene," and discover ways to self-manage certain sleeping problems through simple daily behavior changes. Finally, the workshop will motivate patients by clarifying the links between quality sleep and their goals of heart-healthy living.   Recognizing and Reducing Stress Clinical staff led group instruction and group discussion with PowerPoint presentation and patient guidebook. To enhance the learning environment the use of posters, models and videos may be  added. At the conclusion of this workshop, patients will be able to understand the types of stress reactions, differentiate between acute and chronic stress, and recognize the impact that chronic stress has on their health. They will also be able to  apply different coping mechanisms, such as reframing negative self-talk. Patients will have the opportunity to practice a variety of stress management techniques, such as deep abdominal breathing, progressive muscle relaxation, and/or guided imagery.  The purpose of this lesson is to educate patients on the role of stress in their lives and to provide healthy techniques for coping with it.  Learning Barriers/Preferences:  Learning Barriers/Preferences - 04/30/23 1610       Learning Barriers/Preferences   Learning Barriers Sight   glasses   Learning Preferences Audio;Computer/Internet;Group Instruction;Individual Instruction;Skilled Demonstration;Verbal Instruction;Video;Written Material;Pictoral             Education Topics:  Knowledge Questionnaire Score:  Knowledge Questionnaire Score - 04/30/23 0906       Knowledge Questionnaire Score   Pre Score 19/24             Core Components/Risk Factors/Patient Goals at Admission:  Personal Goals and Risk Factors at Admission - 04/30/23 0905       Core Components/Risk Factors/Patient Goals on Admission    Weight Management Yes;Obesity;Weight Loss    Intervention Weight Management: Develop a combined nutrition and exercise program designed to reach desired caloric intake, while maintaining appropriate intake of nutrient and fiber, sodium and fats, and appropriate energy expenditure required for the weight goal.;Weight Management: Provide education and appropriate resources to help participant work on and attain dietary goals.;Weight Management/Obesity: Establish reasonable short term and long term weight goals.;Obesity: Provide education and appropriate resources to help participant work on and attain dietary goals.    Admit Weight 225 lb (102.1 kg)    Goal Weight: Long Term 205 lb (93 kg)   pt goal   Hypertension Yes    Intervention Provide education on lifestyle modifcations including regular physical activity/exercise, weight  management, moderate sodium restriction and increased consumption of fresh fruit, vegetables, and low fat dairy, alcohol moderation, and smoking cessation.;Monitor prescription use compliance.    Expected Outcomes Short Term: Continued assessment and intervention until BP is < 140/22mm HG in hypertensive participants. < 130/40mm HG in hypertensive participants with diabetes, heart failure or chronic kidney disease.;Long Term: Maintenance of blood pressure at goal levels.    Lipids Yes    Intervention Provide education and support for participant on nutrition & aerobic/resistive exercise along with prescribed medications to achieve LDL 70mg , HDL >40mg .    Expected Outcomes Long Term: Cholesterol controlled with medications as prescribed, with individualized exercise RX and with personalized nutrition plan. Value goals: LDL < 70mg , HDL > 40 mg.;Short Term: Participant states understanding of desired cholesterol values and is compliant with medications prescribed. Participant is following exercise prescription and nutrition guidelines.             Core Components/Risk Factors/Patient Goals Review:   Goals and Risk Factor Review     Row Name 05/20/23 1351 06/21/23 0926           Core Components/Risk Factors/Patient Goals Review   Personal Goals Review Weight Management/Obesity;Hypertension;Lipids Weight Management/Obesity;Hypertension;Lipids      Review Radford is off to a good start to exercise at cardiac rehab. Resting sytolic BP noted in the low 90's. Patient asymptomatic. Will continue to monitor BP.  Tyrrell has lost 3 kg since starting cardiac rehab. Santez is doing well  exercise  at cardiac rehab. Resting Jeremyah has lost 5.7 kg since starting cardiac rehab. Vital signs have been stable      Expected Outcomes Domenico will continue to participate in cardiac rehab for exercise, nutrition and lifestyle modifications Dorsie will continue to participate in cardiac rehab for exercise,  nutrition and lifestyle modifications               Core Components/Risk Factors/Patient Goals at Discharge (Final Review):   Goals and Risk Factor Review - 06/21/23 0926       Core Components/Risk Factors/Patient Goals Review   Personal Goals Review Weight Management/Obesity;Hypertension;Lipids    Review Hershel is doing well  exercise at cardiac rehab. Resting Yiannis has lost 5.7 kg since starting cardiac rehab. Vital signs have been stable    Expected Outcomes Tristan will continue to participate in cardiac rehab for exercise, nutrition and lifestyle modifications             ITP Comments:  ITP Comments     Row Name 04/30/23 0858 05/03/23 0954 05/20/23 1345 06/21/23 0923     ITP Comments Dr. Gaylyn Keas medical director. Introduction to pritikin education/intensive cardiac rehab. Initial orientation packet reviewed with patient. 30 Day ITP Review. Amin started cardiac rehab on 05/03/23. Joban did well with exercise. 30 Day ITP Review. Hanif has good attendance and partcipation with exercise at cardiac rehab. 30 Day ITP Review. Gagandeep has good attendance and partcipation with exercise at cardiac rehab. Colton returned to cardiac rehab after being on vacation             Comments: See ITP comments.Monte Antonio RN BSN

## 2023-06-22 DIAGNOSIS — M545 Low back pain, unspecified: Secondary | ICD-10-CM | POA: Diagnosis not present

## 2023-06-23 ENCOUNTER — Encounter (HOSPITAL_COMMUNITY)
Admission: RE | Admit: 2023-06-23 | Discharge: 2023-06-23 | Disposition: A | Discharge status: Home / Self Care | Source: Ambulatory Visit | Attending: Cardiovascular Disease | Admitting: Cardiovascular Disease

## 2023-06-23 DIAGNOSIS — I2111 ST elevation (STEMI) myocardial infarction involving right coronary artery: Secondary | ICD-10-CM

## 2023-06-23 DIAGNOSIS — Z955 Presence of coronary angioplasty implant and graft: Secondary | ICD-10-CM | POA: Diagnosis not present

## 2023-06-25 ENCOUNTER — Encounter (HOSPITAL_COMMUNITY)
Admission: RE | Admit: 2023-06-25 | Discharge: 2023-06-25 | Disposition: A | Discharge status: Home / Self Care | Source: Ambulatory Visit | Attending: Cardiovascular Disease | Admitting: Cardiovascular Disease

## 2023-06-25 DIAGNOSIS — I2111 ST elevation (STEMI) myocardial infarction involving right coronary artery: Secondary | ICD-10-CM | POA: Diagnosis not present

## 2023-06-25 DIAGNOSIS — Z955 Presence of coronary angioplasty implant and graft: Secondary | ICD-10-CM | POA: Diagnosis not present

## 2023-06-29 DIAGNOSIS — R7303 Prediabetes: Secondary | ICD-10-CM | POA: Diagnosis not present

## 2023-06-29 DIAGNOSIS — I2510 Atherosclerotic heart disease of native coronary artery without angina pectoris: Secondary | ICD-10-CM | POA: Diagnosis not present

## 2023-06-30 ENCOUNTER — Encounter (HOSPITAL_COMMUNITY)
Admission: RE | Admit: 2023-06-30 | Discharge: 2023-06-30 | Disposition: A | Discharge status: Home / Self Care | Source: Ambulatory Visit | Attending: Cardiovascular Disease | Admitting: Cardiovascular Disease

## 2023-06-30 DIAGNOSIS — I2111 ST elevation (STEMI) myocardial infarction involving right coronary artery: Secondary | ICD-10-CM

## 2023-06-30 DIAGNOSIS — Z955 Presence of coronary angioplasty implant and graft: Secondary | ICD-10-CM | POA: Diagnosis not present

## 2023-07-02 ENCOUNTER — Encounter (HOSPITAL_COMMUNITY)
Admission: RE | Admit: 2023-07-02 | Discharge: 2023-07-02 | Disposition: A | Discharge status: Home / Self Care | Source: Ambulatory Visit | Attending: Cardiovascular Disease | Admitting: Cardiovascular Disease

## 2023-07-02 DIAGNOSIS — I2111 ST elevation (STEMI) myocardial infarction involving right coronary artery: Secondary | ICD-10-CM | POA: Diagnosis not present

## 2023-07-02 DIAGNOSIS — Z955 Presence of coronary angioplasty implant and graft: Secondary | ICD-10-CM | POA: Diagnosis not present

## 2023-07-05 ENCOUNTER — Encounter (HOSPITAL_COMMUNITY)
Admission: RE | Admit: 2023-07-05 | Discharge: 2023-07-05 | Disposition: A | Discharge status: Home / Self Care | Source: Ambulatory Visit | Attending: Cardiovascular Disease | Admitting: Cardiovascular Disease

## 2023-07-05 DIAGNOSIS — Z48812 Encounter for surgical aftercare following surgery on the circulatory system: Secondary | ICD-10-CM | POA: Diagnosis not present

## 2023-07-05 DIAGNOSIS — I252 Old myocardial infarction: Secondary | ICD-10-CM | POA: Insufficient documentation

## 2023-07-05 DIAGNOSIS — Z955 Presence of coronary angioplasty implant and graft: Secondary | ICD-10-CM | POA: Insufficient documentation

## 2023-07-05 DIAGNOSIS — I2111 ST elevation (STEMI) myocardial infarction involving right coronary artery: Secondary | ICD-10-CM

## 2023-07-06 DIAGNOSIS — N529 Male erectile dysfunction, unspecified: Secondary | ICD-10-CM | POA: Diagnosis not present

## 2023-07-06 DIAGNOSIS — R7303 Prediabetes: Secondary | ICD-10-CM | POA: Diagnosis not present

## 2023-07-06 DIAGNOSIS — I2510 Atherosclerotic heart disease of native coronary artery without angina pectoris: Secondary | ICD-10-CM | POA: Diagnosis not present

## 2023-07-06 DIAGNOSIS — K4090 Unilateral inguinal hernia, without obstruction or gangrene, not specified as recurrent: Secondary | ICD-10-CM | POA: Diagnosis not present

## 2023-07-06 DIAGNOSIS — F5101 Primary insomnia: Secondary | ICD-10-CM | POA: Diagnosis not present

## 2023-07-06 DIAGNOSIS — Z9889 Other specified postprocedural states: Secondary | ICD-10-CM | POA: Diagnosis not present

## 2023-07-06 DIAGNOSIS — I252 Old myocardial infarction: Secondary | ICD-10-CM | POA: Diagnosis not present

## 2023-07-06 DIAGNOSIS — I10 Essential (primary) hypertension: Secondary | ICD-10-CM | POA: Diagnosis not present

## 2023-07-07 ENCOUNTER — Encounter (HOSPITAL_COMMUNITY)
Admission: RE | Admit: 2023-07-07 | Discharge: 2023-07-07 | Disposition: A | Discharge status: Home / Self Care | Source: Ambulatory Visit | Attending: Cardiovascular Disease | Admitting: Cardiovascular Disease

## 2023-07-07 DIAGNOSIS — Z48812 Encounter for surgical aftercare following surgery on the circulatory system: Secondary | ICD-10-CM | POA: Diagnosis not present

## 2023-07-07 DIAGNOSIS — Z955 Presence of coronary angioplasty implant and graft: Secondary | ICD-10-CM

## 2023-07-07 DIAGNOSIS — I2111 ST elevation (STEMI) myocardial infarction involving right coronary artery: Secondary | ICD-10-CM

## 2023-07-07 DIAGNOSIS — I252 Old myocardial infarction: Secondary | ICD-10-CM | POA: Diagnosis not present

## 2023-07-09 ENCOUNTER — Encounter (HOSPITAL_COMMUNITY)
Admission: RE | Admit: 2023-07-09 | Discharge: 2023-07-09 | Disposition: A | Discharge status: Home / Self Care | Source: Ambulatory Visit | Attending: Cardiovascular Disease | Admitting: Cardiovascular Disease

## 2023-07-09 DIAGNOSIS — I252 Old myocardial infarction: Secondary | ICD-10-CM | POA: Diagnosis not present

## 2023-07-09 DIAGNOSIS — Z48812 Encounter for surgical aftercare following surgery on the circulatory system: Secondary | ICD-10-CM | POA: Diagnosis not present

## 2023-07-09 DIAGNOSIS — Z955 Presence of coronary angioplasty implant and graft: Secondary | ICD-10-CM | POA: Diagnosis not present

## 2023-07-09 DIAGNOSIS — I2111 ST elevation (STEMI) myocardial infarction involving right coronary artery: Secondary | ICD-10-CM

## 2023-07-12 ENCOUNTER — Encounter (HOSPITAL_COMMUNITY)
Admission: RE | Admit: 2023-07-12 | Discharge: 2023-07-12 | Disposition: A | Discharge status: Home / Self Care | Source: Ambulatory Visit | Attending: Cardiovascular Disease

## 2023-07-12 DIAGNOSIS — I252 Old myocardial infarction: Secondary | ICD-10-CM | POA: Diagnosis not present

## 2023-07-12 DIAGNOSIS — I2111 ST elevation (STEMI) myocardial infarction involving right coronary artery: Secondary | ICD-10-CM

## 2023-07-12 DIAGNOSIS — Z955 Presence of coronary angioplasty implant and graft: Secondary | ICD-10-CM | POA: Diagnosis not present

## 2023-07-12 DIAGNOSIS — Z48812 Encounter for surgical aftercare following surgery on the circulatory system: Secondary | ICD-10-CM | POA: Diagnosis not present

## 2023-07-14 ENCOUNTER — Encounter (HOSPITAL_COMMUNITY)
Admission: RE | Admit: 2023-07-14 | Discharge: 2023-07-14 | Disposition: A | Discharge status: Home / Self Care | Source: Ambulatory Visit | Attending: Cardiovascular Disease | Admitting: Cardiovascular Disease

## 2023-07-14 DIAGNOSIS — Z48812 Encounter for surgical aftercare following surgery on the circulatory system: Secondary | ICD-10-CM | POA: Diagnosis not present

## 2023-07-14 DIAGNOSIS — Z955 Presence of coronary angioplasty implant and graft: Secondary | ICD-10-CM | POA: Diagnosis not present

## 2023-07-14 DIAGNOSIS — I2111 ST elevation (STEMI) myocardial infarction involving right coronary artery: Secondary | ICD-10-CM

## 2023-07-14 DIAGNOSIS — I252 Old myocardial infarction: Secondary | ICD-10-CM | POA: Diagnosis not present

## 2023-07-16 ENCOUNTER — Encounter (HOSPITAL_COMMUNITY)
Admission: RE | Admit: 2023-07-16 | Discharge: 2023-07-16 | Disposition: A | Discharge status: Home / Self Care | Source: Ambulatory Visit | Attending: Cardiovascular Disease

## 2023-07-16 DIAGNOSIS — I2111 ST elevation (STEMI) myocardial infarction involving right coronary artery: Secondary | ICD-10-CM

## 2023-07-16 DIAGNOSIS — I252 Old myocardial infarction: Secondary | ICD-10-CM | POA: Diagnosis not present

## 2023-07-16 DIAGNOSIS — Z955 Presence of coronary angioplasty implant and graft: Secondary | ICD-10-CM

## 2023-07-16 DIAGNOSIS — Z48812 Encounter for surgical aftercare following surgery on the circulatory system: Secondary | ICD-10-CM | POA: Diagnosis not present

## 2023-07-19 ENCOUNTER — Encounter (HOSPITAL_COMMUNITY)

## 2023-07-20 NOTE — Progress Notes (Signed)
 Cardiac Individual Treatment Plan  Patient Details  Name: Mark Levine MRN: [id number] Date of Birth: [birth year] Referring Provider:   Flowsheet Row INTENSIVE CARDIAC REHAB ORIENT from 04/30/2023 in [facility] for [facility], [facility]  Referring Provider Antoinette Batman, MD    Initial Encounter Date:  Flowsheet Row INTENSIVE CARDIAC REHAB ORIENT from 04/30/2023 in [facility] for Heart, Vascular, & Lung Health  Date 04/30/23    Visit Diagnosis: 04/17/23 STEMI  04/17/23 DES RCA  Patient's Home Medications on Admission:  Current Outpatient Medications:    aspirin  EC 81 MG EC tablet, Take 1 tablet (81 mg total) by mouth daily., Disp: , Rfl:    atorvastatin  (LIPITOR ) 80 MG tablet, Take 1 tablet (80 mg total) by mouth daily., Disp: 90 tablet, Rfl: 3   ezetimibe  (ZETIA ) 10 MG tablet, Take 1 tablet (10 mg total) by mouth daily., Disp: 90 tablet, Rfl: 3   losartan  (COZAAR ) 50 MG tablet, Take 50 mg by mouth daily., Disp: , Rfl:    Melatonin Gummies 5 MG CHEW, Chew 5 mg by mouth as needed (insomnia)., Disp: , Rfl:    metoprolol  succinate (TOPROL -XL) 25 MG 24 hr tablet, Take 1 tablet (25 mg total) by mouth daily., Disp: 90 tablet, Rfl: 3   nitroGLYCERIN  (NITROSTAT ) 0.4 MG SL tablet, Place 1 tablet (0.4 mg total) under the tongue every 5 (five) minutes x 3 doses as needed for chest pain. Do not take within 72 hrs of viagra , Disp: 25 tablet, Rfl: 3   prasugrel  (EFFIENT ) 10 MG TABS tablet, Take 1 tablet (10 mg total) by mouth daily., Disp: 90 tablet, Rfl: 3   traZODone (DESYREL) 50 MG tablet, Take 1 tablet by mouth as needed., Disp: , Rfl:    VIAGRA  100 MG tablet, Take 1 tablet (100 mg total) by mouth daily as needed for erectile dysfunction. Do not take within 72 hrs of nitroglycerin , Disp: 10 tablet, Rfl: 1  Past Medical History: Past Medical History:  Diagnosis Date   Coronary Artery Disease 04/04/2013   Inf STEMI in 04/2013 s/p 3  x 16 mm DES to RCA     Stress MPI 02/15/15: apical lateral infarct, no ischemia, EF 48    Inf STEMI s/p 3 x 32 mm DES to dRCA in 04/2023      LHC 04/17/23: LM 30; LAD mid 40; RI 60; RCA prox 35, dist 100; EF 55-65     TTE 04/17/23: EF 50-55, NL RVSF, mild to mod MR, AV sclerosis, inf HK     Cough due to ACE inhibitor 05/11/2014   Lisinopril  discontinued 05/11/14    Erectile dysfunction 10/11/2013   Essential hypertension 05/10/2014   History of nuclear stress test    a.Myoview 1/17: EF 48%, apical and apical lateral scar, no ischemia; Intermediate Risk (low EF)   Hyperlipidemia    MI, 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   Inferior STEMI  04/02/13     Tobacco Use: Social History   Tobacco Use  Smoking Status Never  Smokeless Tobacco Never    Labs: Review Flowsheet  More data may exist      Latest Ref Rng & Units 04/02/2013 04/03/2013 05/05/2013 02/15/2015 04/17/2023  Labs for ITP Cardiac and Pulmonary Rehab  Cholestrol 0 - 200 mg/dL - 782  956  213  086   LDL (calc) 0 - 99 mg/dL - 578  52  62  68   HDL-C >40 mg/dL - 45  42  40  35  Trlycerides <150 mg/dL - 161  33  65  21   Hemoglobin A1c 4.8 - 5.6 % 5.7  - - - 5.7     Capillary Blood Glucose: No results found for: GLUCAP   Exercise Target Goals: Exercise Program Goal: Individual exercise prescription set using results from initial 6 min walk test and THRR while considering  patient's activity barriers and safety.   Exercise Prescription Goal: Initial exercise prescription builds to 30-45 minutes a day of aerobic activity, 2-3 days per week.  Home exercise guidelines will be given to patient during program as part of exercise prescription that the participant will acknowledge.  Activity Barriers & Risk Stratification:  Activity Barriers & Cardiac Risk Stratification - 04/30/23 0902       Activity Barriers & Cardiac Risk Stratification   Activity Barriers Left Knee Replacement    Cardiac Risk Stratification High   <5 METs on         6 Minute  Walk:  6 Minute Walk     Row Name 04/30/23 1023         6 Minute Walk   Phase Initial     Distance 1756 feet     Walk Time 6 minutes     # of Rest Breaks 0     MPH 3.38     METS 3.81     RPE 11     Perceived Dyspnea  0     VO2 Peak 13.35     Symptoms Yes (comment)     Comments L knee stiffness, no pain or s/sx     Resting HR 76 bpm     Resting BP 118/72     Resting Oxygen Saturation  99 %     Exercise Oxygen Saturation  during 6 min walk 99 %     Max Ex. HR 95 bpm     Max Ex. BP 142/70     2 Minute Post BP 126/70        Oxygen Initial Assessment:   Oxygen Re-Evaluation:   Oxygen Discharge (Final Oxygen Re-Evaluation):   Initial Exercise Prescription:  Initial Exercise Prescription - 04/30/23 1000       Date of Initial Exercise RX and Referring Provider   Date 04/30/23    Referring Provider Antoinette Batman, MD    Expected Discharge Date 07/21/23      Bike   Level 1.5    Watts 40    Minutes 15    METs 2.5      NuStep   Level --    SPM --    Minutes --    METs --      Recumbant Elliptical   Level 2    RPM 60    Watts 20    Minutes 15    METs 2      Prescription Details   Frequency (times per week) 3    Duration Progress to 30 minutes of continuous aerobic without signs/symptoms of physical distress      Intensity   THRR 40-80% of Max Heartrate 61-123    Ratings of Perceived Exertion 11-13    Perceived Dyspnea 0-4      Progression   Progression Continue progressive overload as per policy without signs/symptoms or physical distress.      Resistance Training   Training Prescription Yes    Weight 4    Reps 10-15          Perform Capillary Blood Glucose checks as needed.  Exercise Prescription Changes:   Exercise Prescription Changes     Row Name 05/03/23 1100 05/21/23 1400 06/02/23 1200 06/21/23 1100 07/02/23 1200     Response to Exercise   Blood Pressure (Admit) 112/62 122/66 104/62 104/60 116/60   Blood Pressure  (Exercise) 128/78 128/60 130/72 -- --   Blood Pressure (Exit) 118/72 118/64 116/62 104/70 114/72   Heart Rate (Admit) 74 bpm 67 bpm 67 bpm 60 bpm 62 bpm   Heart Rate (Exercise) 85 bpm 103 bpm 83 bpm 106 bpm 92 bpm   Heart Rate (Exit) 75 bpm 64 bpm 72 bpm 69 bpm 63 bpm   Rating of Perceived Exertion (Exercise) 11 11 11 12 12    Symptoms None None None None None   Comments Pt's first day in the CRP2 program Reviwed METs and Home exercise Rx Reviewed METs and goals Pt moved to Ellipitical, Reviewed METs Reviewed METs and Goals   Duration Continue with 30 min of aerobic exercise without signs/symptoms of physical distress. Continue with 30 min of aerobic exercise without signs/symptoms of physical distress. Continue with 30 min of aerobic exercise without signs/symptoms of physical distress. Continue with 30 min of aerobic exercise without signs/symptoms of physical distress. Continue with 30 min of aerobic exercise without signs/symptoms of physical distress.   Intensity THRR unchanged THRR unchanged THRR unchanged THRR unchanged THRR unchanged     Progression   Progression Continue to progress workloads to maintain intensity without signs/symptoms of physical distress. Continue to progress workloads to maintain intensity without signs/symptoms of physical distress. Continue to progress workloads to maintain intensity without signs/symptoms of physical distress. Continue to progress workloads to maintain intensity without signs/symptoms of physical distress. Continue to progress workloads to maintain intensity without signs/symptoms of physical distress.   Average METs 2.4 2.7 2.85 4.55 3.8     Resistance Training   Training Prescription Yes Yes No Yes Yes   Weight 4 lbs 5 lbs No weights on wednesdyas 5 lbs 5 lbs   Reps 10-15 10-15 -- 10-15 10-15   Time 10 Minutes 10 Minutes -- 5 Minutes 5 Minutes     Interval Training   Interval Training No No No No No     Bike   Level -- 3 4 5 5    Watts 19.5  25 29.5 40.4 54.4   Minutes 15 15 15 15 15    METs 2.59 2.79 2.95 3.31 3.8     Recumbant Elliptical   Level 1 3 4  -- 6   RPM 54 57 50 -- 55   Watts 64 68 67 -- 95   Minutes 15 15 15  -- 15   METs 2.2 2.6 2.7 -- 3.8     Elliptical   Level -- -- -- 1 --   Speed -- -- -- 1 --   Minutes -- -- -- 15 --   METs -- -- -- 5.1 --     Home Exercise Plan   Plans to continue exercise at -- Home (comment) Home (comment) -- --   Frequency -- Add 2 additional days to program exercise sessions. Add 2 additional days to program exercise sessions. -- --   Initial Home Exercises Provided -- 05/21/23 05/21/23 -- --    Row Name 07/16/23 1200             Response to Exercise   Blood Pressure (Admit) 120/64       Blood Pressure (Exit) 108/68       Heart Rate (Admit) 61 bpm  Heart Rate (Exercise) 100 bpm       Heart Rate (Exit) 70 bpm       Rating of Perceived Exertion (Exercise) 12       Symptoms None       Comments Reviewed METs       Duration Continue with 30 min of aerobic exercise without signs/symptoms of physical distress.       Intensity THRR unchanged         Progression   Progression Continue to progress workloads to maintain intensity without signs/symptoms of physical distress.       Average METs 4.35         Resistance Training   Training Prescription Yes       Weight 6 lbs       Reps 10-15       Time 5 Minutes         Interval Training   Interval Training No         Bike   Level 5       Watts 62.7       Minutes 15       METs 4.07         Recumbant Elliptical   Level 6       RPM 64       Watts 116       Minutes 15       METs 4.6         Home Exercise Plan   Plans to continue exercise at Home (comment)       Frequency Add 2 additional days to program exercise sessions.       Initial Home Exercises Provided 05/21/23          Exercise Comments:   Exercise Comments     Row Name 05/03/23 1133 05/21/23 1131 06/02/23 1220 06/21/23 1159 07/02/23 1234    Exercise Comments Pt's first day in the CRP2 program. Pt completed session with no complaints. Reviewed METs and home exercise Rx today. Pt is exercising at home on his stationary bike for 30 minutes. Pt will continuw with this. Pt verbalized understnading of the home exercise Rx and was provided a copy. Pt voices that he has been doing yard work again and the plan is for him to resume using his Elliptical at home, which is a goal. Pt is making slow progress. Reviewed METs and Goals with pt. Pt increased avg METs from 2.6 to 3.8. Also moved pt from Recumebent Ellipitical to Upright Elippitical to better align with goals and progress in program. Pt tolerated change well. Reviewed METs and Goals, pt has lost about 13-14lbs since starting the program. Pt has made significant changes in diet and continues to exercise in the somewhat hard range. Pt reports that his confidence has increased while exercising and he does have negative thoughts while exercising. Pt moved back to Octane, he reported that the elipitical caused knee pain.    Row Name 07/16/23 1215           Exercise Comments Reviewed METs. Pt's mets can be inconsistant. Encouraged to maintain METs above 4.0 and work to increase toward 5.0. Pt verbalized understanding.          Exercise Goals and Review:   Exercise Goals     Row Name 04/30/23 0902             Exercise Goals   Increase Physical Activity Yes       Intervention Provide advice, education,  support and counseling about physical activity/exercise needs.;Develop an individualized exercise prescription for aerobic and resistive training based on initial evaluation findings, risk stratification, comorbidities and participant's personal goals.       Expected Outcomes Short Term: Attend rehab on a regular basis to increase amount of physical activity.;Long Term: Exercising regularly at least 3-5 days a week.;Long Term: Add in home exercise to make exercise part of routine and to  increase amount of physical activity.       Increase Strength and Stamina Yes       Intervention Provide advice, education, support and counseling about physical activity/exercise needs.;Develop an individualized exercise prescription for aerobic and resistive training based on initial evaluation findings, risk stratification, comorbidities and participant's personal goals.       Expected Outcomes Short Term: Increase workloads from initial exercise prescription for resistance, speed, and METs.;Long Term: Improve cardiorespiratory fitness, muscular endurance and strength as measured by increased METs and functional capacity ( );Short Term: Perform resistance training exercises routinely during rehab and add in resistance training at home       Able to understand and use rate of perceived exertion (RPE) scale Yes       Intervention Provide education and explanation on how to use RPE scale       Expected Outcomes Short Term: Able to use RPE daily in rehab to express subjective intensity level;Long Term:  Able to use RPE to guide intensity level when exercising independently       Knowledge and understanding of Target Heart Rate Range (THRR) Yes       Intervention Provide education and explanation of THRR including how the numbers were predicted and where they are located for reference       Expected Outcomes Short Term: Able to state/look up THRR;Short Term: Able to use daily as guideline for intensity in rehab;Long Term: Able to use THRR to govern intensity when exercising independently       Understanding of Exercise Prescription Yes       Intervention Provide education, explanation, and written materials on patient's individual exercise prescription       Expected Outcomes Short Term: Able to explain program exercise prescription;Long Term: Able to explain home exercise prescription to exercise independently          Exercise Goals Re-Evaluation :  Exercise Goals Re-Evaluation     Row Name  05/03/23 1132 06/02/23 1218 06/21/23 0828 07/02/23 1226       Exercise Goal Re-Evaluation   Exercise Goals Review Increase Physical Activity;Increase Strength and Stamina;Able to understand and use rate of perceived exertion (RPE) scale;Knowledge and understanding of Target Heart Rate Range (THRR);Understanding of Exercise Prescription Increase Physical Activity;Increase Strength and Stamina;Able to understand and use rate of perceived exertion (RPE) scale;Knowledge and understanding of Target Heart Rate Range (THRR);Understanding of Exercise Prescription Increase Physical Activity;Increase Strength and Stamina;Able to understand and use rate of perceived exertion (RPE) scale;Knowledge and understanding of Target Heart Rate Range (THRR);Understanding of Exercise Prescription Increase Physical Activity;Increase Strength and Stamina;Able to understand and use rate of perceived exertion (RPE) scale;Knowledge and understanding of Target Heart Rate Range (THRR);Understanding of Exercise Prescription    Comments Pt's first day in the CRP2 program. Pt understands the exercise Rx, RPE scale and THRR. Reviewed METs and goals. Pt voices he is making progress on his goal of increased strength and stamina. Pt is back to playing golf which was is a goal. Pt also has goal of weight loss and has lost 4.3 kg to  date. Reviewed METs and Goals with pt. Pt increased avg METs from 2.6 to 3.8. Also moved pt from Recumebent Ellipitical to Upright Elippitical to better align with goals and progress in program. Pt tolerated change well. Reviewed METs and Goals, pt has lost about 13-14lbs since starting the program. Pt has made significant changes in diet and continues to exercise in the somewhat hard range. Pt reports that his confidence has increased while exercising and he does have negative thoughts while exercising. Pt moved back to Octane, he reported that the elipitical caused knee pain.    Expected Outcomes Will continue to  monitor patient and progress exercise workloads as tolerated. Will continue to monitor patient and progress exercise workloads as tolerated. Will continue to monitor patient and progress exercise workloads as tolerated. Will continue to monitor patient and progress exercise workloads as tolerated.       Discharge Exercise Prescription (Final Exercise Prescription Changes):  Exercise Prescription Changes - 07/16/23 1200       Response to Exercise   Blood Pressure (Admit) 120/64    Blood Pressure (Exit) 108/68    Heart Rate (Admit) 61 bpm    Heart Rate (Exercise) 100 bpm    Heart Rate (Exit) 70 bpm    Rating of Perceived Exertion (Exercise) 12    Symptoms None    Comments Reviewed METs    Duration Continue with 30 min of aerobic exercise without signs/symptoms of physical distress.    Intensity THRR unchanged      Progression   Progression Continue to progress workloads to maintain intensity without signs/symptoms of physical distress.    Average METs 4.35      Resistance Training   Training Prescription Yes    Weight 6 lbs    Reps 10-15    Time 5 Minutes      Interval Training   Interval Training No      Bike   Level 5    Watts 62.7    Minutes 15    METs 4.07      Recumbant Elliptical   Level 6    RPM 64    Watts 116    Minutes 15    METs 4.6      Home Exercise Plan   Plans to continue exercise at Home (comment)    Frequency Add 2 additional days to program exercise sessions.    Initial Home Exercises Provided 05/21/23          Nutrition:  Target Goals: Understanding of nutrition guidelines, daily intake of sodium 1500mg , cholesterol 200mg , calories 30% from fat and 7% or less from saturated fats, daily to have 5 or more servings of fruits and vegetables.  Biometrics:  Pre Biometrics - 04/30/23 0857       Pre Biometrics   Waist Circumference 44 inches    Hip Circumference 45 inches    Waist to Hip Ratio 0.98 %    Triceps Skinfold 18 mm    % Body Fat  30.8 %    Grip Strength 44 kg    Flexibility 14 in    Single Leg Stand 30 seconds           Nutrition Therapy Plan and Nutrition Goals:  Nutrition Therapy & Goals - 07/09/23 0901       Nutrition Therapy   Diet Heart Healthy Diet    Drug/Food Interactions Statins/Certain Fruits      Personal Nutrition Goals   Nutrition Goal Patient to identify strategies for reducing cardiovascular  risk by attending the [facility] education and nutrition series weekly.   goal in action.   Personal Goal #2 Patient to improve diet quality by using the plate method as a guide for meal planning to include lean protein/plant protein, fruits, vegetables, whole grains, nonfat dairy as part of a well-balanced diet.   goal in action.   Personal Goal #3 Patient to identify strategies for weight loss of 0.5-2.0# per week.   goal in action.   Comments Goals in action. Suhayb has medical history of STEMI, HTN, hyperlipidemia, CAD. He has previously completed cardiac rehab in the past. He continues to attend the [facility] education/nutrition series regularly He reports using herbalife protein shake and tea for breakfast/snacks. He is down 14.5# since starting with our program and is motivated to lose to ~205#. LDL is not at goal <55; he continues zetia , crestor but will consider PCSK9-i. He has adopted many heart healthy dietary changes including increased dietary fiber, decreased saturated fat, and decreased sodium intake. He has good understanding of calorie density, label reading, etc. Patient will benefit from participation in intensive cardiac rehab for nutrition, exercise, and lifestyle modification.      Intervention Plan   Intervention Prescribe, educate and counsel regarding individualized specific dietary modifications aiming towards targeted core components such as weight, hypertension, lipid management, diabetes, heart failure and other comorbidities.;Nutrition handout(s) given to patient.    Expected Outcomes  Short Term Goal: Understand basic principles of dietary content, such as calories, fat, sodium, cholesterol and nutrients.;Long Term Goal: Adherence to prescribed nutrition plan.          Nutrition Assessments:  Nutrition Assessments - 05/07/23 1132       Rate Your Plate Scores   Pre Score 43         MEDIFICTS Score Key: >=70 Need to make dietary changes  40-70 Heart Healthy Diet <= 40 Therapeutic Level Cholesterol Diet   Flowsheet Row INTENSIVE CARDIAC REHAB from 05/07/2023 in [facility]  Picture Your Plate Total Score on Admission 43   Picture Your Plate Scores: <16 Unhealthy dietary pattern with much room for improvement. 41-50 Dietary pattern unlikely to meet recommendations for good health and room for improvement. 51-60 More healthful dietary pattern, with some room for improvement.  >60 Healthy dietary pattern, although there may be some specific behaviors that could be improved.    Nutrition Goals Re-Evaluation:  Nutrition Goals Re-Evaluation     Row Name 05/03/23 1020 06/04/23 0946 07/09/23 0901         Goals   Current Weight 227 lb 4.7 oz (103.1 kg) 216 lb 4.3 oz (98.1 kg) 211 lb 3.2 oz (95.8 kg)     Comment LDL 68, HDL 35, A1c 5.7, Lpa 100.5 no new labs; most recent labs LDL 68, HDL 35, A1c 5.7, Lpa 100.5 no new labs; most recent labs LDL 68, HDL 35, A1c 5.7, Lpa 100.5     Expected Outcome Searcy has medical history of STEMI, HTN, hyperlipidemia, CAD. He has previously completed cardiac rehab in the past. He reports using herbalife protein shake and tea for breakfast. LDL is not at goal <55; he continues zetia , crestor but will consider PCSK9-i. Patient will benefit from participation in intensive cardiac rehab for nutrition, exercise, and lifestyle modification. Goals in action. Arlynn has medical history of STEMI, HTN, hyperlipidemia, CAD. He has previously completed cardiac rehab in the past. He  continues to attend the [facility] education/nutrition series regularly He  reports using herbalife protein shake and tea for breakfast/snacks. He is down 9.5# since starting with our program and is motivated to lose to ~205#. LDL is not at goal <55; he continues zetia , crestor but will consider PCSK9-i. Patient will benefit from participation in intensive cardiac rehab for nutrition, exercise, and lifestyle modification. Goals in action. Ethel has medical history of STEMI, HTN, hyperlipidemia, CAD. He has previously completed cardiac rehab in the past. He continues to attend the [facility] education/nutrition series regularly He reports using herbalife protein shake and tea for breakfast/snacks. He is down 14.5# since starting with our program and is motivated to lose to ~205#. LDL is not at goal <55; he continues zetia , crestor but will consider PCSK9-i. He has adopted many heart healthy dietary changes including increased dietary fiber, decreased saturated fat, and decreased sodium intake. He has good understanding of calorie density, label reading, etc. Patient will benefit from participation in intensive cardiac rehab for nutrition, exercise, and lifestyle modification.        Nutrition Goals Re-Evaluation:  Nutrition Goals Re-Evaluation     Row Name 05/03/23 1020 06/04/23 0946 07/09/23 0901         Goals   Current Weight 227 lb 4.7 oz (103.1 kg) 216 lb 4.3 oz (98.1 kg) 211 lb 3.2 oz (95.8 kg)     Comment LDL 68, HDL 35, A1c 5.7, Lpa 100.5 no new labs; most recent labs LDL 68, HDL 35, A1c 5.7, Lpa 100.5 no new labs; most recent labs LDL 68, HDL 35, A1c 5.7, Lpa 100.5     Expected Outcome Tywan has medical history of STEMI, HTN, hyperlipidemia, CAD. He has previously completed cardiac rehab in the past. He reports using herbalife protein shake and tea for breakfast. LDL is not at goal <55; he continues zetia , crestor but will consider PCSK9-i. Patient will benefit from participation in intensive  cardiac rehab for nutrition, exercise, and lifestyle modification. Goals in action. Javier has medical history of STEMI, HTN, hyperlipidemia, CAD. He has previously completed cardiac rehab in the past. He continues to attend the [facility] education/nutrition series regularly He reports using herbalife protein shake and tea for breakfast/snacks. He is down 9.5# since starting with our program and is motivated to lose to ~205#. LDL is not at goal <55; he continues zetia , crestor but will consider PCSK9-i. Patient will benefit from participation in intensive cardiac rehab for nutrition, exercise, and lifestyle modification. Goals in action. Tyrie has medical history of STEMI, HTN, hyperlipidemia, CAD. He has previously completed cardiac rehab in the past. He continues to attend the [facility] education/nutrition series regularly He reports using herbalife protein shake and tea for breakfast/snacks. He is down 14.5# since starting with our program and is motivated to lose to ~205#. LDL is not at goal <55; he continues zetia , crestor but will consider P[employer]K9-i. He has adopted many heart healthy dietary changes including increased dietary fiber, decreased saturated fat, and decreased sodium intake. He has good understanding of calorie density, label reading, etc. Patient will benefit from participation in intensive cardiac rehab for nutrition, exercise, and lifestyle modification.        Nutrition Goals Discharge (Final Nutrition Goals Re-Evaluation):  Nutrition Goals Re-Evaluation - 07/09/23 0901       Goals   Current Weight 211 lb 3.2 oz (95.8 kg)    Comment no new labs; most recent labs LDL 68, HDL 35, A1c 5.7, Lpa 100.5    Expected Outcome Goals in action. [place]au has medical history of STEMI, HTN,  hyperlipidemia, CAD. He has previously completed cardiac rehab in the past. He continues to attend the Pritikin education/nutrition series regularly He reports using herbalife protein shake and tea for  breakfast/snacks. He is down 14.5# since starting with our program and is motivated to lose to ~205#. LDL is not at goal <55; he continues zetia , crestor but will consider PCSK9-i. He has adopted many heart healthy dietary changes including increased dietary fiber, decreased saturated fat, and decreased sodium intake. He has good understanding of calorie density, label reading, etc. Patient will benefit from participation in intensive cardiac rehab for nutrition, exercise, and lifestyle modification.          Psychosocial: Target Goals: Acknowledge presence or absence of significant depression and/or stress, maximize coping skills, provide positive support system. Participant is able to verbalize types and ability to use techniques and skills needed for reducing stress and depression.  Initial Review & Psychosocial Screening:  Initial Psych Review & Screening - 04/30/23 0902       Initial Review   Current issues with None Identified      Family Dynamics   Good Support System? Yes   Wife for support   Comments Laurens denies any feelings of depression/anxiety/stress. He shared that he often has to get up several times a night to use the restroom and that is disruptive to his sleep, however his medications he takes to help him sleep at night are working well for him. He denies any need for additional resources at this time.      Barriers   Psychosocial barriers to participate in program There are no identifiable barriers or psychosocial needs.      Screening Interventions   Interventions Encouraged to exercise;Provide feedback about the scores to participant    Expected Outcomes Short Term goal: Identification and review with participant of any Quality of Life or Depression concerns found by scoring the questionnaire.;Long Term goal: The participant improves quality of Life and PHQ9 Scores as seen by post scores and/or verbalization of changes;Long Term Goal: Stressors or current issues are  controlled or eliminated.          Quality of Life Scores:  Quality of Life - 04/30/23 1024       Quality of Life   Select Quality of Life      Quality of Life Scores   Health/Function Pre 23.86 %    Socioeconomic Pre 23.14 %    Psych/Spiritual Pre 24.86 %    Family Pre 24.6 %    GLOBAL Pre 24.03 %         Scores of 19 and below usually indicate a poorer quality of life in these areas.  A difference of  2-3 points is a clinically meaningful difference.  A difference of 2-3 points in the total score of the Quality of Life Index has been associated with significant improvement in overall quality of life, self-image, physical symptoms, and general health in studies assessing change in quality of life.  PHQ-9: Review Flowsheet       04/30/2023 07/24/2013 04/24/2013  Depression screen PHQ 2/9  Decreased Interest 0 0 0  Down, Depressed, Hopeless 0 0 0  PHQ - 2 Score 0 0 0  Altered sleeping 0 - -  Tired, decreased energy 0 - -  Change in appetite 0 - -  Feeling bad or failure about yourself  0 - -  Trouble concentrating 0 - -  Moving slowly or fidgety/restless 0 - -  Suicidal thoughts 0 - -  PHQ-9 Score 0 - -   Interpretation of Total Score  Total Score Depression Severity:  1-4 = Minimal depression, 5-9 = Mild depression, 10-14 = Moderate depression, 15-19 = Moderately severe depression, 20-27 = Severe depression   Psychosocial Evaluation and Intervention:   Psychosocial Re-Evaluation:  Psychosocial Re-Evaluation     Row Name 05/03/23 0955 05/20/23 1346 06/21/23 0925 07/20/23 1539       Psychosocial Re-Evaluation   Current issues with None Identified None Identified None Identified None Identified    Interventions Encouraged to attend Cardiac Rehabilitation for the exercise Encouraged to attend Cardiac Rehabilitation for the exercise Encouraged to attend Cardiac Rehabilitation for the exercise Encouraged to attend Cardiac Rehabilitation for the exercise    Continue  Psychosocial Services  No Follow up required No Follow up required No Follow up required No Follow up required       Psychosocial Discharge (Final Psychosocial Re-Evaluation):  Psychosocial Re-Evaluation - 07/20/23 1539       Psychosocial Re-Evaluation   Current issues with None Identified    Interventions Encouraged to attend Cardiac Rehabilitation for the exercise    Continue Psychosocial Services  No Follow up required          Vocational Rehabilitation: Provide vocational rehab assistance to qualifying candidates.   Vocational Rehab Evaluation & Intervention:  Vocational Rehab - 04/30/23 0904       Initial Vocational Rehab Evaluation & Intervention   Assessment shows need for Vocational Rehabilitation No   Travonta is retired.         Education: Education Goals: Education classes will be provided on a weekly basis, covering required topics. Participant will state understanding/return demonstration of topics presented.    Education     Row Name 05/03/23 0900     Education   Cardiac Education Topics Pritikin   Select Core Videos     Core Videos   Educator Exercise Physiologist   Select Exercise Education   Exercise Education Biomechanial Limitations   Instruction Review Code 1- Verbalizes Understanding   Class Start Time 0808   Class Stop Time 0841   Class Time Calculation (min) 33 min    Row Name 05/05/23 0900     Education   Cardiac Education Topics Pritikin   Secondary school teacher School   Educator Dietitian   Weekly Topic Fast Evening Meals   Instruction Review Code 1- Verbalizes Understanding   Class Start Time 0815   Class Stop Time 0960   Class Time Calculation (min) 38 min    Row Name 05/07/23 0900     Education   Cardiac Education Topics Pritikin   Select Core Videos     Core Videos   Educator Dietitian   Select Nutrition   Nutrition Vitamins and Minerals   Instruction Review Code 1- Verbalizes Understanding   Class  Start Time 0815   Class Stop Time 0857   Class Time Calculation (min) 42 min    Row Name 05/10/23 0800     Education   Cardiac Education Topics Pritikin   Select Core Videos     Core Videos   Educator Dietitian   Select Nutrition   Nutrition Fueling a Healthy Body   Instruction Review Code 1- Verbalizes Understanding   Class Start Time 0815   Class Stop Time 0900   Class Time Calculation (min) 45 min    Row Name 05/12/23 0900     Education   Cardiac Education Topics Pritikin   Select  Office manager   Weekly Topic International Cuisine- Spotlight on the [employer]   Instruction Review Code 1- Verbalizes Understanding   Class Start Time 0815   Class Stop Time 0850   Class Time Calculation (min) 35 min    Row Name 05/14/23 0800     Education   Cardiac Education Topics Pritikin   Select Core Videos     Core Videos   Educator Exercise Physiologist   Select Exercise Education   Exercise Education Improving Performance   Instruction Review Code 1- Verbalizes Understanding   Class Start Time [phone]   Class Stop Time 0848   Class Time Calculation (min) 37 min    Row Name 05/17/23 0900     Education   Cardiac Education Topics Pritikin   Select Workshops     Workshops   Educator Exercise Physiologist   Select Psychosocial   Psychosocial Workshop Healthy Sleep for a Healthy Heart   Instruction Review Code 1- Verbalizes Understanding   Class Start Time [phone]   Class Stop Time 0858   Class Time Calculation (min) 46 min    Row Name 05/24/23 0800     Education   Cardiac Education Topics Pritikin   Select Workshops     Workshops   Educator Exercise Physiologist   Select Exercise   Exercise Workshop Managing Heart Disease: Your Path to a Healthier Heart   Instruction Review Code 1- Verbalizes Understanding   Class Start Time [phone]   Class Stop Time 0900   Class Time Calculation (min) 48 min    Row Name 05/26/23 0900      Education   Cardiac Education Topics Pritikin   Secondary school teacher School   Educator Exercise Physiologist   Weekly Topic Efficiency Cooking - Meals in a Snap   Instruction Review Code 1- Verbalizes Understanding   Class Start Time 0815   Class Stop Time 0855   Class Time Calculation (min) 40 min    Row Name 05/28/23 0800     Education   Cardiac Education Topics Pritikin   Select Core Videos     Core Videos   Educator Exercise Physiologist   Select General Education   General Education Hypertension and Heart Disease   Instruction Review Code 1- Verbalizes Understanding   Class Start Time [phone]   Class Stop Time 0847   Class Time Calculation (min) 35 min    Row Name 05/31/23 0800     Education   Cardiac Education Topics Pritikin   Geographical information systems officer Psychosocial   Psychosocial Workshop From Head to Heart: The Power of a Healthy Outlook   Instruction Review Code 1- Verbalizes Understanding   Class Start Time 0815   Class Stop Time 0900   Class Time Calculation (min) 45 min    Row Name 06/02/23 0800     Education   Cardiac Education Topics Pritikin   Secondary school teacher School   Educator Dietitian   Weekly Topic Tasty Appetizers and Snacks   Instruction Review Code 1- Verbalizes Understanding   Class Start Time 0815   Class Stop Time 0855   Class Time Calculation (min) 40 min    Row Name 06/04/23 0800     Education   Cardiac Education Topics Pritikin   Select Core Videos     Core Videos  Educator Exercise Industrial/product designer Education   General Education Heart Disease Risk Reduction   Instruction Review Code 1- Verbalizes Understanding   Class Start Time 0813   Class Stop Time 0850   Class Time Calculation (min) 37 min    Row Name 06/07/23 0800     Education   Cardiac Education Topics Pritikin   Select Core Videos     Core Videos   Educator Exercise  Physiologist   Select Psychosocial   Psychosocial Healthy Minds, Bodies, Hearts   Instruction Review Code 1- Verbalizes Understanding   Class Start Time 0815   Class Stop Time 0850   Class Time Calculation (min) 35 min    Row Name 06/09/23 0800     Education   Cardiac Education Topics Pritikin   Secondary school teacher School   Educator Dietitian   Weekly Topic Adding Flavor - Sodium-Free   Instruction Review Code 1- Verbalizes Understanding   Class Start Time 0815   Class Stop Time 0855   Class Time Calculation (min) 40 min    Row Name 06/11/23 0900     Education   Cardiac Education Topics Pritikin   Select Workshops     Workshops   Educator Exercise Physiologist   Select Exercise   Exercise Workshop Location manager and Fall Prevention   Instruction Review Code 1- Verbalizes Understanding   Class Start Time 0810   Class Stop Time 0850   Class Time Calculation (min) 40 min    Row Name 06/21/23 0800     Education   Cardiac Education Topics Pritikin   Hospital doctor Education   General Education Metabolic Syndrome and Belly Fat   Instruction Review Code 1- Verbalizes Understanding   Class Start Time 0815   Class Stop Time 0851   Class Time Calculation (min) 36 min    Row Name 06/23/23 1000     Education   Cardiac Education Topics Pritikin   Secondary school teacher School   Educator Dietitian   Weekly Topic Personalizing Your Pritikin Plate   Instruction Review Code 1- Verbalizes Understanding   Class Start Time 0815   Class Stop Time 0850   Class Time Calculation (min) 35 min    Row Name 06/25/23 0900     Education   Cardiac Education Topics Pritikin   Select Core Videos     Core Videos   Educator Dietitian   Select Nutrition   Nutrition Overview of the Pritikin Eating Plan   Instruction Review Code 1- Verbalizes Understanding   Class Start Time 0815    Class Stop Time 0900   Class Time Calculation (min) 45 min    Row Name 06/30/23 0800     Education   Cardiac Education Topics Pritikin   Secondary school teacher School   Educator Dietitian   Weekly Topic Delicious Desserts   Instruction Review Code 1- Verbalizes Understanding   Class Start Time 0815   Class Stop Time 0855   Class Time Calculation (min) 40 min    Row Name 07/02/23 0800     Education   Cardiac Education Topics Pritikin   Select Core Videos     Core Videos   Educator Dietitian   Select Nutrition   Nutrition Calorie Density   Instruction Review Code 1- Verbalizes Understanding   Class  Start Time 0815   Class Stop Time 0855   Class Time Calculation (min) 40 min    Row Name 07/05/23 0800     Education   Cardiac Education Topics Pritikin   Select Workshops     Workshops   Educator Exercise Physiologist   Select Exercise   Exercise Workshop Exercise Basics: Building Your Action Plan   Instruction Review Code 1- Verbalizes Understanding   Class Start Time 0810   Class Stop Time 0853   Class Time Calculation (min) 43 min    Row Name 07/07/23 0800     Education   Cardiac Education Topics Pritikin   Orthoptist   Educator Dietitian   Weekly Topic Efficiency Cooking - Meals in a Snap   Instruction Review Code 1- Verbalizes Understanding   Class Start Time 0815   Class Stop Time 0850   Class Time Calculation (min) 35 min    Row Name 07/09/23 0900     Education   Cardiac Education Topics Pritikin   Psychologist, forensic Exercise Education   Exercise Education Move It!   Instruction Review Code 1- Verbalizes Understanding   Class Start Time [phone]   Class Stop Time 0845   Class Time Calculation (min) 35 min    Row Name 07/12/23 0800     Education   Cardiac Education Topics Pritikin   Scientist, water quality Nutrition   Nutrition Workshop Targeting Your Nutrition Priorities   Instruction Review Code 1- Verbalizes Understanding   Class Start Time 0815   Class Stop Time 0855   Class Time Calculation (min) 40 min    Row Name 07/14/23 0800     Education   Cardiac Education Topics Pritikin   Secondary school teacher School   Educator Dietitian   Weekly Topic One-Pot Wonders   Instruction Review Code 1- Verbalizes Understanding   Class Start Time 0815   Class Stop Time 0850   Class Time Calculation (min) 35 min    Row Name 07/16/23 0800     Education   Cardiac Education Topics Pritikin   Select Core Videos     Core Videos   Educator Exercise Physiologist   Select General Education   General Education Hypertension and Heart Disease   Instruction Review Code 1- Verbalizes Understanding   Class Start Time [phone]   Class Stop Time 0847   Class Time Calculation (min) 35 min      Core Videos: Exercise    Move It!  Clinical staff conducted group or individual video education with verbal and written material and guidebook.  Patient learns the recommended Pritikin exercise program. Exercise with the goal of living a long, healthy life. Some of the health benefits of exercise include controlled diabetes, healthier blood pressure levels, improved cholesterol levels, improved heart and lung capacity, improved sleep, and better body composition. Everyone should speak with their doctor before starting or changing an exercise routine.  Biomechanical Limitations Clinical staff conducted group or individual video education with verbal and written material and guidebook.  Patient learns how biomechanical limitations can impact exercise and how we can mitigate and possibly overcome limitations to have an impactful and balanced exercise routine.  Body Composition Clinical staff conducted group or individual video education with verbal and written material and guidebook.  Patient  learns that body composition (ratio of muscle mass to fat mass) is a key component to assessing overall fitness, rather than body weight alone. Increased fat mass, especially visceral belly fat, can put us  at increased risk for metabolic syndrome, type 2 diabetes, heart disease, and even death. It is recommended to combine diet and exercise (cardiovascular and resistance training) to improve your body composition. Seek guidance from your physician and exercise physiologist before implementing an exercise routine.  Exercise Action Plan Clinical staff conducted group or individual video education with verbal and written material and guidebook.  Patient learns the recommended strategies to achieve and enjoy long-term exercise adherence, including variety, self-motivation, self-efficacy, and positive decision making. Benefits of exercise include fitness, good health, weight management, more energy, better sleep, less stress, and overall well-being.  Medical   Heart Disease Risk Reduction Clinical staff conducted group or individual video education with verbal and written material and guidebook.  Patient learns our heart is our most vital organ as it circulates oxygen, nutrients, white blood cells, and hormones throughout the entire body, and carries waste away. Data supports a plant-based eating plan like the Pritikin Program for its effectiveness in slowing progression of and reversing heart disease. The video provides a number of recommendations to address heart disease.   Metabolic Syndrome and Belly Fat  Clinical staff conducted group or individual video education with verbal and written material and guidebook.  Patient learns what metabolic syndrome is, how it leads to heart disease, and how one can reverse it and keep it from coming back. You have metabolic syndrome if you have 3 of the following 5 criteria: abdominal obesity, high blood pressure, high triglycerides, low HDL cholesterol, and high  blood sugar.  Hypertension and Heart Disease Clinical staff conducted group or individual video education with verbal and written material and guidebook.  Patient learns that high blood pressure, or hypertension, is very common in the [place] . Hypertension is largely due to excessive salt intake, but other important risk factors include being overweight, physical inactivity, drinking too much alcohol, smoking, and not eating enough potassium from fruits and vegetables. High blood pressure is a leading risk factor for heart attack, stroke, congestive heart failure, dementia, kidney failure, and premature death. Long-term effects of excessive salt intake include stiffening of the arteries and thickening of heart muscle and organ damage. Recommendations include ways to reduce hypertension and the risk of heart disease.  Diseases of Our Time - Focusing on Diabetes Clinical staff conducted group or individual video education with verbal and written material and guidebook.  Patient learns why the best way to stop diseases of our time is prevention, through food and other lifestyle changes. Medicine (such as prescription pills and surgeries) is often only a Band-Aid on the problem, not a long-term solution. Most common diseases of our time include obesity, type 2 diabetes, hypertension, heart disease, and cancer. The Pritikin Program is recommended and has been proven to help reduce, reverse, and/or prevent the damaging effects of metabolic syndrome.  Nutrition   Overview of the Pritikin Eating Plan  Clinical staff conducted group or individual video education with verbal and written material and guidebook.  Patient learns about the Pritikin Eating Plan for disease risk reduction. The Pritikin Eating Plan emphasizes a wide variety of unrefined, minimally-processed carbohydrates, like fruits, vegetables, whole grains, and legumes. Go, Caution, and Stop food choices are explained. Plant-based and lean  animal proteins are emphasized. Rationale provided for low sodium intake for blood pressure  control, low added sugars for blood sugar stabilization, and low added fats and oils for coronary artery disease risk reduction and weight management.  Calorie Density  Clinical staff conducted group or individual video education with verbal and written material and guidebook.  Patient learns about calorie density and how it impacts the Pritikin Eating Plan. Knowing the characteristics of the food you choose will help you decide whether those foods will lead to weight gain or weight loss, and whether you want to consume more or less of them. Weight loss is usually a side effect of the Pritikin Eating Plan because of its focus on low calorie-dense foods.  Label Reading  Clinical staff conducted group or individual video education with verbal and written material and guidebook.  Patient learns about the Pritikin recommended label reading guidelines and corresponding recommendations regarding calorie density, added sugars, sodium content, and whole grains.  Dining Out - Part 1  Clinical staff conducted group or individual video education with verbal and written material and guidebook.  Patient learns that restaurant meals can be sabotaging because they can be so high in calories, fat, sodium, and/or sugar. Patient learns recommended strategies on how to positively address this and avoid unhealthy pitfalls.  Facts on Fats  Clinical staff conducted group or individual video education with verbal and written material and guidebook.  Patient learns that lifestyle modifications can be just as effective, if not more so, as many medications for lowering your risk of heart disease. A Pritikin lifestyle can help to reduce your risk of inflammation and atherosclerosis (cholesterol build-up, or plaque, in the artery walls). Lifestyle interventions such as dietary choices and physical activity address the cause of  atherosclerosis. A review of the types of fats and their impact on blood cholesterol levels, along with dietary recommendations to reduce fat intake is also included.  Nutrition Action Plan  Clinical staff conducted group or individual video education with verbal and written material and guidebook.  Patient learns how to incorporate Pritikin recommendations into their lifestyle. Recommendations include planning and keeping personal health goals in mind as an important part of their success.  Healthy Mind-Set    Healthy Minds, Bodies, Hearts  Clinical staff conducted group or individual video education with verbal and written material and guidebook.  Patient learns how to identify when they are stressed. Video will discuss the impact of that stress, as well as the many benefits of stress management. Patient will also be introduced to stress management techniques. The way we think, act, and feel has an impact on our hearts.  How Our Thoughts Can Heal Our Hearts  Clinical staff conducted group or individual video education with verbal and written material and guidebook.  Patient learns that negative thoughts can cause depression and anxiety. This can result in negative lifestyle behavior and serious health problems. Cognitive behavioral therapy is an effective method to help control our thoughts in order to change and improve our emotional outlook.  Additional Videos:  Exercise    Improving Performance  Clinical staff conducted group or individual video education with verbal and written material and guidebook.  Patient learns to use a non-linear approach by alternating intensity levels and lengths of time spent exercising to help burn more calories and lose more body fat. Cardiovascular exercise helps improve heart health, metabolism, hormonal balance, blood sugar control, and recovery from fatigue. Resistance training improves strength, endurance, balance, coordination, reaction time, metabolism,  and muscle mass. Flexibility exercise improves circulation, posture, and balance. Seek guidance from your  physician and exercise physiologist before implementing an exercise routine and learn your capabilities and proper form for all exercise.  Introduction to Yoga  Clinical staff conducted group or individual video education with verbal and written material and guidebook.  Patient learns about yoga, a discipline of the coming together of mind, breath, and body. The benefits of yoga include improved flexibility, improved range of motion, better posture and core strength, increased lung function, weight loss, and positive self-image. Yoga's heart health benefits include lowered blood pressure, healthier heart rate, decreased cholesterol and triglyceride levels, improved immune function, and reduced stress. Seek guidance from your physician and exercise physiologist before implementing an exercise routine and learn your capabilities and proper form for all exercise.  Medical   Aging: Enhancing Your Quality of Life  Clinical staff conducted group or individual video education with verbal and written material and guidebook.  Patient learns key strategies and recommendations to stay in good physical health and enhance quality of life, such as prevention strategies, having an advocate, securing a Health Care Proxy and Power of Attorney, and keeping a list of medications and system for tracking them. It also discusses how to avoid risk for bone loss.  Biology of Weight Control  Clinical staff conducted group or individual video education with verbal and written material and guidebook.  Patient learns that weight gain occurs because we consume more calories than we burn (eating more, moving less). Even if your body weight is normal, you may have higher ratios of fat compared to muscle mass. Too much body fat puts you at increased risk for cardiovascular disease, heart attack, stroke, type 2 diabetes, and  obesity-related cancers. In addition to exercise, following the Pritikin Eating Plan can help reduce your risk.  Decoding Lab Results  Clinical staff conducted group or individual video education with verbal and written material and guidebook.  Patient learns that lab test reflects one measurement whose values change over time and are influenced by many factors, including medication, stress, sleep, exercise, food, hydration, pre-existing medical conditions, and more. It is recommended to use the knowledge from this video to become more involved with your lab results and evaluate your numbers to speak with your doctor.   Diseases of Our Time - Overview  Clinical staff conducted group or individual video education with verbal and written material and guidebook.  Patient learns that according to the CDC, 50% to 70% of chronic diseases (such as obesity, type 2 diabetes, elevated lipids, hypertension, and heart disease) are avoidable through lifestyle improvements including healthier food choices, listening to satiety cues, and increased physical activity.  Sleep Disorders Clinical staff conducted group or individual video education with verbal and written material and guidebook.  Patient learns how good quality and duration of sleep are important to overall health and well-being. Patient also learns about sleep disorders and how they impact health along with recommendations to address them, including discussing with a physician.  Nutrition  Dining Out - Part 2 Clinical staff conducted group or individual video education with verbal and written material and guidebook.  Patient learns how to plan ahead and communicate in order to maximize their dining experience in a healthy and nutritious manner. Included are recommended food choices based on the type of restaurant the patient is visiting.   Fueling a Banker conducted group or individual video education with verbal and written  material and guidebook.  There is a strong connection between our food choices and our health. Diseases like  obesity and type 2 diabetes are very prevalent and are in large-part due to lifestyle choices. The Pritikin Eating Plan provides plenty of food and hunger-curbing satisfaction. It is easy to follow, affordable, and helps reduce health risks.  Menu Workshop  Clinical staff conducted group or individual video education with verbal and written material and guidebook.  Patient learns that restaurant meals can sabotage health goals because they are often packed with calories, fat, sodium, and sugar. Recommendations include strategies to plan ahead and to communicate with the manager, chef, or server to help order a healthier meal.  Planning Your Eating Strategy  Clinical staff conducted group or individual video education with verbal and written material and guidebook.  Patient learns about the Pritikin Eating Plan and its benefit of reducing the risk of disease. The Pritikin Eating Plan does not focus on calories. Instead, it emphasizes high-quality, nutrient-rich foods. By knowing the characteristics of the foods, we choose, we can determine their calorie density and make informed decisions.  Targeting Your Nutrition Priorities  Clinical staff conducted group or individual video education with verbal and written material and guidebook.  Patient learns that lifestyle habits have a tremendous impact on disease risk and progression. This video provides eating and physical activity recommendations based on your personal health goals, such as reducing LDL cholesterol, losing weight, preventing or controlling type 2 diabetes, and reducing high blood pressure.  Vitamins and Minerals  Clinical staff conducted group or individual video education with verbal and written material and guidebook.  Patient learns different ways to obtain key vitamins and minerals, including through a recommended healthy diet.  It is important to discuss all supplements you take with your doctor.   Healthy Mind-Set    Smoking Cessation  Clinical staff conducted group or individual video education with verbal and written material and guidebook.  Patient learns that cigarette smoking and tobacco addiction pose a serious health risk which affects millions of people. Stopping smoking will significantly reduce the risk of heart disease, lung disease, and many forms of cancer. Recommended strategies for quitting are covered, including working with your doctor to develop a successful plan.  Culinary   Becoming a Set designer conducted group or individual video education with verbal and written material and guidebook.  Patient learns that cooking at home can be healthy, cost-effective, quick, and puts them in control. Keys to cooking healthy recipes will include looking at your recipe, assessing your equipment needs, planning ahead, making it simple, choosing cost-effective seasonal ingredients, and limiting the use of added fats, salts, and sugars.  Cooking - Breakfast and Snacks  Clinical staff conducted group or individual video education with verbal and written material and guidebook.  Patient learns how important breakfast is to satiety and nutrition through the entire day. Recommendations include key foods to eat during breakfast to help stabilize blood sugar levels and to prevent overeating at meals later in the day. Planning ahead is also a key component.  Cooking - Educational psychologist conducted group or individual video education with verbal and written material and guidebook.  Patient learns eating strategies to improve overall health, including an approach to cook more at home. Recommendations include thinking of animal protein as a side on your plate rather than center stage and focusing instead on lower calorie dense options like vegetables, fruits, whole grains, and plant-based  proteins, such as beans. Making sauces in large quantities to freeze for later and leaving the skin on your vegetables  are also recommended to maximize your experience.  Cooking - Healthy Salads and Dressing Clinical staff conducted group or individual video education with verbal and written material and guidebook.  Patient learns that vegetables, fruits, whole grains, and legumes are the foundations of the Pritikin Eating Plan. Recommendations include how to incorporate each of these in flavorful and healthy salads, and how to create homemade salad dressings. Proper handling of ingredients is also covered. Cooking - Soups and [employer] - Soups and Desserts Clinical staff conducted group or individual video education with verbal and written material and guidebook.  Patient learns that Pritikin soups and desserts make for easy, nutritious, and delicious snacks and meal components that are low in sodium, fat, sugar, and calorie density, while high in vitamins, minerals, and filling fiber. Recommendations include simple and healthy ideas for soups and desserts.   Overview     The Pritikin Solution Program Overview Clinical staff conducted group or individual video education with verbal and written material and guidebook.  Patient learns that the results of the Pritikin Program have been documented in more than 100 articles published in peer-reviewed journals, and the benefits include reducing risk factors for (and, in some cases, even reversing) high cholesterol, high blood pressure, type 2 diabetes, obesity, and more! An overview of the three key pillars of the Pritikin Program will be covered: eating well, doing regular exercise, and having a healthy mind-set.  WORKSHOPS  Exercise: Exercise Basics: Building Your Action Plan Clinical staff led group instruction and group discussion with PowerPoint presentation and patient guidebook. To enhance the learning environment the use of posters,  models and videos may be added. At the conclusion of this workshop, patients will comprehend the difference between physical activity and exercise, as well as the benefits of incorporating both, into their routine. Patients will understand the FITT (Frequency, Intensity, Time, and Type) principle and how to use it to build an exercise action plan. In addition, safety concerns and other considerations for exercise and cardiac rehab will be addressed by the presenter. The purpose of this lesson is to promote a comprehensive and effective weekly exercise routine in order to improve patients' overall level of fitness.   Managing Heart Disease: Your Path to a Healthier Heart Clinical staff led group instruction and group discussion with PowerPoint presentation and patient guidebook. To enhance the learning environment the use of posters, models and videos may be added.At the conclusion of this workshop, patients will understand the anatomy and physiology of the heart. Additionally, they will understand how Pritikin's three pillars impact the risk factors, the progression, and the management of heart disease.  The purpose of this lesson is to provide a high-level overview of the heart, heart disease, and how the Pritikin lifestyle positively impacts risk factors.  Exercise Biomechanics Clinical staff led group instruction and group discussion with PowerPoint presentation and patient guidebook. To enhance the learning environment the use of posters, models and videos may be added. Patients will learn how the structural parts of their bodies function and how these functions impact their daily activities, movement, and exercise. Patients will learn how to promote a neutral spine, learn how to manage pain, and identify ways to improve their physical movement in order to promote healthy living. The purpose of this lesson is to expose patients to common physical limitations that impact physical activity.  Participants will learn practical ways to adapt and manage aches and pains, and to minimize their effect on regular exercise. Patients will learn how  to maintain good posture while sitting, walking, and lifting.  Balance Training and Fall Prevention  Clinical staff led group instruction and group discussion with PowerPoint presentation and patient guidebook. To enhance the learning environment the use of posters, models and videos may be added. At the conclusion of this workshop, patients will understand the importance of their sensorimotor skills (vision, proprioception, and the vestibular system) in maintaining their ability to balance as they age. Patients will apply a variety of balancing exercises that are appropriate for their current level of function. Patients will understand the common causes for poor balance, possible solutions to these problems, and ways to modify their physical environment in order to minimize their fall risk. The purpose of this lesson is to teach patients about the importance of maintaining balance as they age and ways to minimize their risk of falling.  WORKSHOPS   Nutrition:  Fueling a Ship broker led group instruction and group discussion with PowerPoint presentation and patient guidebook. To enhance the learning environment the use of posters, models and videos may be added. Patients will review the foundational principles of the Pritikin Eating Plan and understand what constitutes a serving size in each of the food groups. Patients will also learn Pritikin-friendly foods that are better choices when away from home and review make-ahead meal and snack options. Calorie density will be reviewed and applied to three nutrition priorities: weight maintenance, weight loss, and weight gain. The purpose of this lesson is to reinforce (in a group setting) the key concepts around what patients are recommended to eat and how to apply these guidelines when away  from home by planning and selecting Pritikin-friendly options. Patients will understand how calorie density may be adjusted for different weight management goals.  Mindful Eating  Clinical staff led group instruction and group discussion with PowerPoint presentation and patient guidebook. To enhance the learning environment the use of posters, models and videos may be added. Patients will briefly review the concepts of the Pritikin Eating Plan and the importance of low-calorie dense foods. The concept of mindful eating will be introduced as well as the importance of paying attention to internal hunger signals. Triggers for non-hunger eating and techniques for dealing with triggers will be explored. The purpose of this lesson is to provide patients with the opportunity to review the basic principles of the Pritikin Eating Plan, discuss the value of eating mindfully and how to measure internal cues of hunger and fullness using the Hunger Scale. Patients will also discuss reasons for non-hunger eating and learn strategies to use for controlling emotional eating.  Targeting Your Nutrition Priorities Clinical staff led group instruction and group discussion with PowerPoint presentation and patient guidebook. To enhance the learning environment the use of posters, models and videos may be added. Patients will learn how to determine their genetic susceptibility to disease by reviewing their family history. Patients will gain insight into the importance of diet as part of an overall healthy lifestyle in mitigating the impact of genetics and other environmental insults. The purpose of this lesson is to provide patients with the opportunity to assess their personal nutrition priorities by looking at their family history, their own health history and current risk factors. Patients will also be able to discuss ways of prioritizing and modifying the Pritikin Eating Plan for their highest risk areas  Menu  Clinical  staff led group instruction and group discussion with PowerPoint presentation and patient guidebook. To enhance the learning environment the use of posters,  models and videos may be added. Using menus brought in from [employer], or printed from [employer], patients will apply the Pritikin dining out guidelines that were presented in the [employer] video. Patients will also be able to practice these guidelines in a variety of provided scenarios. The purpose of this lesson is to provide patients with the opportunity to practice hands-on learning of the Pritikin Dining Out guidelines with actual menus and practice scenarios.  Label Reading Clinical staff led group instruction and group discussion with PowerPoint presentation and patient guidebook. To enhance the learning environment the use of posters, models and videos may be added. Patients will review and discuss the Pritikin label reading guidelines presented in Pritikin's Label Reading Educational series video. Using fool labels brought in from local grocery stores and markets, patients will apply the label reading guidelines and determine if the packaged food meet the Pritikin guidelines. The purpose of this lesson is to provide patients with the opportunity to review, discuss, and practice hands-on learning of the Pritikin Label Reading guidelines with actual packaged food labels. Cooking School  Pritikin's [employer] are designed to teach patients ways to prepare quick, simple, and affordable recipes at home. The importance of nutrition's role in chronic disease risk reduction is reflected in its emphasis in the overall Pritikin program. By learning how to prepare essential core Pritikin Eating Plan recipes, patients will increase control over what they eat; be able to customize the flavor of foods without the use of added salt, sugar, or fat; and improve the quality of the food they consume. By learning a set of  core recipes which are easily assembled, quickly prepared, and affordable, patients are more likely to prepare more healthy foods at home. These workshops focus on convenient breakfasts, simple entres, side dishes, and desserts which can be prepared with minimal effort and are consistent with nutrition recommendations for cardiovascular risk reduction. Cooking [employer] are taught by a Armed forces logistics/support/administrative officer (RD) who has been trained by the [employer]. The chef or RD has a clear understanding of the importance of minimizing - if not completely eliminating - added fat, sugar, and sodium in recipes. Throughout the series of Cooking School Workshop sessions, patients will learn about healthy ingredients and efficient methods of cooking to build confidence in their capability to prepare    Cooking School weekly topics:  Adding Flavor- Sodium-Free  Fast and Healthy Breakfasts  Powerhouse Plant-Based Proteins  Satisfying Salads and Dressings  Simple Sides and Sauces  International Cuisine-Spotlight on the [employer] Zones  Delicious Desserts  Savory Soups  [employer] - Meals in a Astronomer Appetizers and Snacks  Comforting Weekend Breakfasts  One-Pot Wonders   Fast Evening Meals  Landscape architect Your Pritikin Plate  WORKSHOPS   Healthy Mindset (Psychosocial):  Focused Goals, Sustainable Changes Clinical staff led group instruction and group discussion with PowerPoint presentation and patient guidebook. To enhance the learning environment the use of posters, models and videos may be added. Patients will be able to apply effective goal setting strategies to establish at least one personal goal, and then take consistent, meaningful action toward that goal. They will learn to identify common barriers to achieving personal goals and develop strategies to overcome them. Patients will also gain an understanding of how our mind-set can impact our ability  to achieve goals and the importance of cultivating a positive and growth-oriented mind-set. The purpose of this lesson is to  provide patients with a deeper understanding of how to set and achieve personal goals, as well as the tools and strategies needed to overcome common obstacles which may arise along the way.  From Head to Heart: The Power of a Healthy Outlook  Clinical staff led group instruction and group discussion with PowerPoint presentation and patient guidebook. To enhance the learning environment the use of posters, models and videos may be added. Patients will be able to recognize and describe the impact of emotions and mood on physical health. They will discover the importance of self-care and explore self-care practices which may work for them. Patients will also learn how to utilize the 4 C's to cultivate a healthier outlook and better manage stress and challenges. The purpose of this lesson is to demonstrate to patients how a healthy outlook is an essential part of maintaining good health, especially as they continue their cardiac rehab journey.  Healthy Sleep for a Healthy Heart Clinical staff led group instruction and group discussion with PowerPoint presentation and patient guidebook. To enhance the learning environment the use of posters, models and videos may be added. At the conclusion of this workshop, patients will be able to demonstrate knowledge of the importance of sleep to overall health, well-being, and quality of life. They will understand the symptoms of, and treatments for, common sleep disorders. Patients will also be able to identify daytime and nighttime behaviors which impact sleep, and they will be able to apply these tools to help manage sleep-related challenges. The purpose of this lesson is to provide patients with a general overview of sleep and outline the importance of quality sleep. Patients will learn about a few of the most common sleep disorders. Patients will  also be introduced to the concept of "sleep hygiene," and discover ways to self-manage certain sleeping problems through simple daily behavior changes. Finally, the workshop will motivate patients by clarifying the links between quality sleep and their goals of heart-healthy living.   Recognizing and Reducing Stress Clinical staff led group instruction and group discussion with PowerPoint presentation and patient guidebook. To enhance the learning environment the use of posters, models and videos may be added. At the conclusion of this workshop, patients will be able to understand the types of stress reactions, differentiate between acute and chronic stress, and recognize the impact that chronic stress has on their health. They will also be able to apply different coping mechanisms, such as reframing negative self-talk. Patients will have the opportunity to practice a variety of stress management techniques, such as deep abdominal breathing, progressive muscle relaxation, and/or guided imagery.  The purpose of this lesson is to educate patients on the role of stress in their lives and to provide healthy techniques for coping with it.  Learning Barriers/Preferences:  Learning Barriers/Preferences - 04/30/23 9147       Learning Barriers/Preferences   Learning Barriers Sight   glasses   Learning Preferences Audio;Computer/Internet;Group Instruction;Individual Instruction;Skilled Demonstration;Verbal Instruction;Video;Written Material;Pictoral          Education Topics:  Knowledge Questionnaire Score:  Knowledge Questionnaire Score - 04/30/23 0906       Knowledge Questionnaire Score   Pre Score 19/24          Core Components/Risk Factors/Patient Goals at Admission:  Personal Goals and Risk Factors at Admission - 04/30/23 0905       Core Components/Risk Factors/Patient Goals on Admission    Weight Management Yes;Obesity;Weight Loss    Intervention Weight Management: Develop a combined  nutrition and  exercise program designed to reach desired caloric intake, while maintaining appropriate intake of nutrient and fiber, sodium and fats, and appropriate energy expenditure required for the weight goal.;Weight Management: Provide education and appropriate resources to help participant work on and attain dietary goals.;Weight Management/Obesity: Establish reasonable short term and long term weight goals.;Obesity: Provide education and appropriate resources to help participant work on and attain dietary goals.    Admit Weight 225 lb (102.1 kg)    Goal Weight: Long Term 205 lb (93 kg)   pt goal   Hypertension Yes    Intervention Provide education on lifestyle modifcations including regular physical activity/exercise, weight management, moderate sodium restriction and increased consumption of fresh fruit, vegetables, and low fat dairy, alcohol moderation, and smoking cessation.;Monitor prescription use compliance.    Expected Outcomes Short Term: Continued assessment and intervention until BP is < 140/80mm HG in hypertensive participants. < 130/77mm HG in hypertensive participants with diabetes, heart failure or chronic kidney disease.;Long Term: Maintenance of blood pressure at goal levels.    Lipids Yes    Intervention Provide education and support for participant on nutrition & aerobic/resistive exercise along with prescribed medications to achieve LDL 70mg , HDL >40mg .    Expected Outcomes Long Term: Cholesterol controlled with medications as prescribed, with individualized exercise RX and with personalized nutrition plan. Value goals: LDL < 70mg , HDL > 40 mg.;Short Term: Participant states understanding of desired cholesterol values and is compliant with medications prescribed. Participant is following exercise prescription and nutrition guidelines.          Core Components/Risk Factors/Patient Goals Review:   Goals and Risk Factor Review     Row Name 05/20/23 1351 06/21/23 0926  07/20/23 1540         Core Components/Risk Factors/Patient Goals Review   Personal Goals Review Weight Management/Obesity;Hypertension;Lipids Weight Management/Obesity;Hypertension;Lipids Weight Management/Obesity;Hypertension;Lipids     Review Evander is off to a good start to exercise at cardiac rehab. Resting sytolic BP noted in the low 90's. Patient asymptomatic. Will continue to monitor BP.  Kasin has lost 3 kg since starting cardiac rehab. Yordan is doing well  exercise at cardiac rehab. Resting Kielan has lost 5.7 kg since starting cardiac rehab. Vital signs have been stable Quantel continues to to do well  exercise at cardiac rehab. Pal has lost 7.4  kg since starting cardiac rehab. Vital signs have been stable.     Expected Outcomes Salvador will continue to participate in cardiac rehab for exercise, nutrition and lifestyle modifications Aakash will continue to participate in cardiac rehab for exercise, nutrition and lifestyle modifications Charley will continue to participate in cardiac rehab for exercise, nutrition and lifestyle modifications        Core Components/Risk Factors/Patient Goals at Discharge (Final Review):   Goals and Risk Factor Review - 07/20/23 1540       Core Components/Risk Factors/Patient Goals Review   Personal Goals Review Weight Management/Obesity;Hypertension;Lipids    Review Brae continues to to do well  exercise at cardiac rehab. Jahan has lost 7.4  kg since starting cardiac rehab. Vital signs have been stable.    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 Introduction to pritikin education/intensive cardiac rehab. Initial orientation packet reviewed with patient. 30 Day  ITP Review. Anothony  started cardiac rehab on 05/03/23. Bradie did well with exercise. 30 Day ITP Review. Breylen has good attendance and partcipation with exercise at cardiac rehab. 30 Day ITP Review. Auther has good attendance and partcipation with exercise at cardiac rehab. Kaylib returned to cardiac rehab after being on vacation 30 Day ITP Review. Padraig has good attendance and partcipation with exercise at cardiac rehab. Kenyatte is currently out of town and will complete cardiac rehab tenatively at the end of June.      Comments: See ITP comments.Monte Antonio RN BSN

## 2023-07-21 ENCOUNTER — Encounter (HOSPITAL_COMMUNITY)

## 2023-07-23 ENCOUNTER — Encounter (HOSPITAL_COMMUNITY)

## 2023-07-26 ENCOUNTER — Encounter (HOSPITAL_COMMUNITY)
Admission: RE | Admit: 2023-07-26 | Discharge: 2023-07-26 | Disposition: A | Discharge status: Home / Self Care | Source: Ambulatory Visit | Attending: Cardiovascular Disease

## 2023-07-26 DIAGNOSIS — I252 Old myocardial infarction: Secondary | ICD-10-CM | POA: Diagnosis not present

## 2023-07-26 DIAGNOSIS — Z955 Presence of coronary angioplasty implant and graft: Secondary | ICD-10-CM

## 2023-07-26 DIAGNOSIS — I2111 ST elevation (STEMI) myocardial infarction involving right coronary artery: Secondary | ICD-10-CM

## 2023-07-26 DIAGNOSIS — Z48812 Encounter for surgical aftercare following surgery on the circulatory system: Secondary | ICD-10-CM | POA: Diagnosis not present

## 2023-07-28 ENCOUNTER — Encounter (HOSPITAL_COMMUNITY)
Admission: RE | Admit: 2023-07-28 | Discharge: 2023-07-28 | Disposition: A | Discharge status: Home / Self Care | Source: Ambulatory Visit | Attending: Cardiovascular Disease | Admitting: Cardiovascular Disease

## 2023-07-28 VITALS — Ht 72.0 in | Wt 209.0 lb

## 2023-07-28 DIAGNOSIS — Z955 Presence of coronary angioplasty implant and graft: Secondary | ICD-10-CM

## 2023-07-28 DIAGNOSIS — I2111 ST elevation (STEMI) myocardial infarction involving right coronary artery: Secondary | ICD-10-CM

## 2023-07-28 DIAGNOSIS — I252 Old myocardial infarction: Secondary | ICD-10-CM | POA: Diagnosis not present

## 2023-07-28 DIAGNOSIS — Z48812 Encounter for surgical aftercare following surgery on the circulatory system: Secondary | ICD-10-CM | POA: Diagnosis not present

## 2023-07-30 ENCOUNTER — Encounter (HOSPITAL_COMMUNITY)
Admission: RE | Admit: 2023-07-30 | Discharge: 2023-07-30 | Disposition: A | Discharge status: Home / Self Care | Source: Ambulatory Visit | Attending: Cardiovascular Disease

## 2023-07-30 DIAGNOSIS — Z955 Presence of coronary angioplasty implant and graft: Secondary | ICD-10-CM

## 2023-07-30 DIAGNOSIS — I252 Old myocardial infarction: Secondary | ICD-10-CM | POA: Diagnosis not present

## 2023-07-30 DIAGNOSIS — I2111 ST elevation (STEMI) myocardial infarction involving right coronary artery: Secondary | ICD-10-CM

## 2023-07-30 DIAGNOSIS — Z48812 Encounter for surgical aftercare following surgery on the circulatory system: Secondary | ICD-10-CM | POA: Diagnosis not present

## 2023-08-02 ENCOUNTER — Encounter (HOSPITAL_COMMUNITY)
Admission: RE | Admit: 2023-08-02 | Discharge: 2023-08-02 | Disposition: A | Discharge status: Home / Self Care | Source: Ambulatory Visit | Attending: Cardiovascular Disease | Admitting: Cardiovascular Disease

## 2023-08-02 DIAGNOSIS — Z955 Presence of coronary angioplasty implant and graft: Secondary | ICD-10-CM | POA: Diagnosis not present

## 2023-08-02 DIAGNOSIS — I252 Old myocardial infarction: Secondary | ICD-10-CM | POA: Diagnosis not present

## 2023-08-02 DIAGNOSIS — I2111 ST elevation (STEMI) myocardial infarction involving right coronary artery: Secondary | ICD-10-CM

## 2023-08-02 DIAGNOSIS — Z48812 Encounter for surgical aftercare following surgery on the circulatory system: Secondary | ICD-10-CM | POA: Diagnosis not present

## 2023-08-02 NOTE — Progress Notes (Signed)
 Discharge Progress Report  Patient Details  Name: Mark Levine MRN: [id number] Date of Birth: 22-May-1955 Referring Provider:   Flowsheet Row INTENSIVE CARDIAC REHAB ORIENT from 04/30/2023 in [facility]  Referring Provider Lonni Cash, MD     Number of Visits: 57  Reason for Discharge:  Patient reached a stable level of exercise. Patient independent in their exercise. Patient has met program and personal goals.  Smoking History:  Social History   Tobacco Use  Smoking Status Never  Smokeless Tobacco Never    Diagnosis:  04/17/23 STEMI  04/17/23 DES RCA  ADL UCSD:   Initial Exercise Prescription:  Initial Exercise Prescription - 04/30/23 1000       Date of Initial Exercise RX and Referring Provider   Date 04/30/23    Referring Provider Lonni Cash, MD    Expected Discharge Date 07/21/23      Bike   Level 1.5    Watts 40    Minutes 15    METs 2.5      NuStep   Level --    SPM --    Minutes --    METs --      Recumbant Elliptical   Level 2    RPM 60    Watts 20    Minutes 15    METs 2      Prescription Details   Frequency (times per week) 3    Duration Progress to 30 minutes of continuous aerobic without signs/symptoms of physical distress      Intensity   THRR 40-80% of Max Heartrate 61-123    Ratings of Perceived Exertion 11-13    Perceived Dyspnea 0-4      Progression   Progression Continue progressive overload as per policy without signs/symptoms or physical distress.      Resistance Training   Training Prescription Yes    Weight 4    Reps 10-15          Discharge Exercise Prescription (Final Exercise Prescription Changes):  Exercise Prescription Changes - 08/02/23 1200       Response to Exercise   Blood Pressure (Admit) 120/70    Blood Pressure (Exit) 114/72    Heart Rate (Admit) 52 bpm    Heart Rate (Exercise) 88 bpm    Heart Rate (Exit) 64 bpm     Rating of Perceived Exertion (Exercise) 11    Symptoms None    Comments Pt graduated from the CRP2 program today    Duration Continue with 30 min of aerobic exercise without signs/symptoms of physical distress.    Intensity THRR unchanged      Progression   Progression Continue to progress workloads to maintain intensity without signs/symptoms of physical distress.    Average METs 4.45      Resistance Training   Training Prescription Yes    Weight 6 lbs    Reps 10-15    Time 5 Minutes      Interval Training   Interval Training No      Bike   Level 5    Watts 76    Minutes 15    METs 4.5      Recumbant Elliptical   Level 6    RPM 61    Watts 160    Minutes 15    METs 4.4      Home Exercise Plan   Plans to continue exercise at Home (comment)    Frequency Add  2 additional days to program exercise sessions.    Initial Home Exercises Provided 05/21/23          Functional Capacity:  6 Minute Walk     Row Name 04/30/23 1023 07/26/23 0920       6 Minute Walk   Phase Initial Discharge    Distance 1756 feet 2003 feet    Distance % Change -- 14.07 %    Distance Feet Change -- 247 ft    Walk Time 6 minutes 6 minutes    # of Rest Breaks 0 0    MPH 3.38 3.8    METS 3.81 4.3    RPE 11 11    Perceived Dyspnea  0 0    VO2 Peak 13.35 15    Symptoms Yes (comment) No    Comments L knee stiffness, no pain or s/sx --    Resting HR 76 bpm 65 bpm    Resting BP 118/72 112/70    Resting Oxygen Saturation  99 % --    Exercise Oxygen Saturation  during 6 min walk 99 % --    Max Ex. HR 95 bpm 94 bpm    Max Ex. BP 142/70 128/70    2 Minute Post BP 126/70 --       Psychological, QOL, Others - Outcomes: PHQ 2/9:    07/30/2023   10:25 AM 04/30/2023    9:02 AM 07/24/2013    3:13 PM 04/24/2013    3:06 PM  Depression screen PHQ 2/9  Decreased Interest 0 0 0 0  Down, Depressed, Hopeless 0 0 0 0  PHQ - 2 Score 0 0 0 0  Altered sleeping 0 0    Tired, decreased energy 0 0     Change in appetite 0 0    Feeling bad or failure about yourself  0 0    Trouble concentrating 0 0    Moving slowly or fidgety/restless 0 0    Suicidal thoughts 0 0    PHQ-9 Score 0 0    Difficult doing work/chores Not difficult at all       Quality of Life:  Quality of Life - 07/30/23 0825       Quality of Life Scores   Health/Function Pre 23.86 %    Health/Function Post 25.3 %    Health/Function % Change 6.04 %    Socioeconomic Pre 23.14 %    Socioeconomic Post 23.44 %    Socioeconomic % Change  1.3 %    Psych/Spiritual Pre 24.86 %    Psych/Spiritual Post 25.5 %    Psych/Spiritual % Change 2.57 %    Family Pre 24.6 %    Family Post 22.5 %    Family % Change -8.54 %    GLOBAL Pre 24.03 %    GLOBAL Post 24.51 %    GLOBAL % Change 2 %          Personal Goals: Goals established at orientation with interventions provided to work toward goal.  Personal Goals and Risk Factors at Admission - 04/30/23 0905       Core Components/Risk Factors/Patient Goals on Admission    Weight Management Yes;Obesity;Weight Loss    Intervention Weight Management: Develop a combined nutrition and exercise program designed to reach desired caloric intake, while maintaining appropriate intake of nutrient and fiber, sodium and fats, and appropriate energy expenditure required for the weight goal.;Weight Management: Provide education and appropriate resources to help participant work on and attain dietary  goals.;Weight Management/Obesity: Establish reasonable short term and long term weight goals.;Obesity: Provide education and appropriate resources to help participant work on and attain dietary goals.    Admit Weight 225 lb (102.1 kg)    Goal Weight: Long Term 205 lb (93 kg)   pt goal   Hypertension Yes    Intervention Provide education on lifestyle modifcations including regular physical activity/exercise, weight management, moderate sodium restriction and increased consumption of fresh fruit,  vegetables, and low fat dairy, alcohol moderation, and smoking cessation.;Monitor prescription use compliance.    Expected Outcomes Short Term: Continued assessment and intervention until BP is < 140/34mm HG in hypertensive participants. < 130/81mm HG in hypertensive participants with diabetes, heart failure or chronic kidney disease.;Long Term: Maintenance of blood pressure at goal levels.    Lipids Yes    Intervention Provide education and support for participant on nutrition & aerobic/resistive exercise along with prescribed medications to achieve LDL 70mg , HDL >40mg .    Expected Outcomes Long Term: Cholesterol controlled with medications as prescribed, with individualized exercise RX and with personalized nutrition plan. Value goals: LDL < 70mg , HDL > 40 mg.;Short Term: Participant states understanding of desired cholesterol values and is compliant with medications prescribed. Participant is following exercise prescription and nutrition guidelines.           Personal Goals Discharge:  Goals and Risk Factor Review     Row Name 05/20/23 1351 06/21/23 0926 07/20/23 1540         Core Components/Risk Factors/Patient Goals Review   Personal Goals Review Weight Management/Obesity;Hypertension;Lipids Weight Management/Obesity;Hypertension;Lipids Weight Management/Obesity;Hypertension;Lipids     Review Immanuel is off to a good start to exercise at cardiac rehab. Resting sytolic BP noted in the low 90's. Patient asymptomatic. Will continue to monitor BP.  Stephens has lost 3 kg since starting cardiac rehab. Pablo is doing well  exercise at cardiac rehab. Resting Rishan has lost 5.7 kg since starting cardiac rehab. Vital signs have been stable Om continues to to do well  exercise at cardiac rehab. Ghazi has lost 7.4  kg since starting cardiac rehab. Vital signs have been stable.     Expected Outcomes Zaccai will continue to participate in cardiac rehab for exercise, nutrition and lifestyle  modifications Malak will continue to participate in cardiac rehab for exercise, nutrition and lifestyle modifications Carel will continue to participate in cardiac rehab for exercise, nutrition and lifestyle modifications        Exercise Goals and Review:  Exercise Goals     Row Name 04/30/23 0902             Exercise Goals   Increase Physical Activity Yes       Intervention Provide advice, education, support and counseling about physical activity/exercise needs.;Develop an individualized exercise prescription for aerobic and resistive training based on initial evaluation findings, risk stratification, comorbidities and participant's personal goals.       Expected Outcomes Short Term: Attend rehab on a regular basis to increase amount of physical activity.;Long Term: Exercising regularly at least 3-5 days a week.;Long Term: Add in home exercise to make exercise part of routine and to increase amount of physical activity.       Increase Strength and Stamina Yes       Intervention Provide advice, education, support and counseling about physical activity/exercise needs.;Develop an individualized exercise prescription for aerobic and resistive training based on initial evaluation findings, risk stratification, comorbidities and participant's personal goals.       Expected Outcomes Short Term:  Increase workloads from initial exercise prescription for resistance, speed, and METs.;Long Term: Improve cardiorespiratory fitness, muscular endurance and strength as measured by increased METs and functional capacity ( );Short Term: Perform resistance training exercises routinely during rehab and add in resistance training at home       Able to understand and use rate of perceived exertion (RPE) scale Yes       Intervention Provide education and explanation on how to use RPE scale       Expected Outcomes Short Term: Able to use RPE daily in rehab to express subjective intensity level;Long Term:  Able  to use RPE to guide intensity level when exercising independently       Knowledge and understanding of Target Heart Rate Range (THRR) Yes       Intervention Provide education and explanation of THRR including how the numbers were predicted and where they are located for reference       Expected Outcomes Short Term: Able to state/look up THRR;Short Term: Able to use daily as guideline for intensity in rehab;Long Term: Able to use THRR to govern intensity when exercising independently       Understanding of Exercise Prescription Yes       Intervention Provide education, explanation, and written materials on patient's individual exercise prescription       Expected Outcomes Short Term: Able to explain program exercise prescription;Long Term: Able to explain home exercise prescription to exercise independently          Exercise Goals Re-Evaluation:  Exercise Goals Re-Evaluation     Row Name 05/03/23 1132 06/02/23 1218 06/21/23 0828 07/02/23 1226 08/02/23 1223     Exercise Goal Re-Evaluation   Exercise Goals Review Increase Physical Activity;Increase Strength and Stamina;Able to understand and use rate of perceived exertion (RPE) scale;Knowledge and understanding of Target Heart Rate Range (THRR);Understanding of Exercise Prescription Increase Physical Activity;Increase Strength and Stamina;Able to understand and use rate of perceived exertion (RPE) scale;Knowledge and understanding of Target Heart Rate Range (THRR);Understanding of Exercise Prescription Increase Physical Activity;Increase Strength and Stamina;Able to understand and use rate of perceived exertion (RPE) scale;Knowledge and understanding of Target Heart Rate Range (THRR);Understanding of Exercise Prescription Increase Physical Activity;Increase Strength and Stamina;Able to understand and use rate of perceived exertion (RPE) scale;Knowledge and understanding of Target Heart Rate Range (THRR);Understanding of Exercise Prescription Increase  Physical Activity;Increase Strength and Stamina;Able to understand and use rate of perceived exertion (RPE) scale;Knowledge and understanding of Target Heart Rate Range (THRR);Understanding of Exercise Prescription   Comments Pt's first day in the CRP2 program. Pt understands the exercise Rx, RPE scale and THRR. Reviewed METs and goals. Pt voices he is making progress on his goal of increased strength and stamina. Pt is back to playing golf which was is a goal. Pt also has goal of weight loss and has lost 4.3 kg to date. Reviewed METs and Goals with pt. Pt increased avg METs from 2.6 to 3.8. Also moved pt from Recumebent Ellipitical to Upright Elippitical to better align with goals and progress in program. Pt tolerated change well. Reviewed METs and Goals, pt has lost about 13-14lbs since starting the program. Pt has made significant changes in diet and continues to exercise in the somewhat hard range. Pt reports that his confidence has increased while exercising and he does have negative thoughts while exercising. Pt moved back to [employer], he reported that the elipitical caused knee pain. Pt graduated from the CRP2 program today. Pt achieved his goal of improving his strength  and stamina. Pt had a goal to lose 20lbs. He lost 16.8 lbs and will continue to focus on the remaining weight. Pt had peak MEts of 5.1. Pt will continue to exercise at his gym using the bike and treamill. Pt will also do resistance training.   Expected Outcomes Will continue to monitor patient and progress exercise workloads as tolerated. Will continue to monitor patient and progress exercise workloads as tolerated. Will continue to monitor patient and progress exercise workloads as tolerated. Will continue to monitor patient and progress exercise workloads as tolerated. Pt will contine to exercise at his gym.      Nutrition & Weight - Outcomes:  Pre Biometrics - 04/30/23 0857       Pre Biometrics   Waist Circumference 44 inches     Hip Circumference 45 inches    Waist to Hip Ratio 0.98 %    Triceps Skinfold 18 mm    % Body Fat 30.8 %    Grip Strength 44 kg    Flexibility 14 in    Single Leg Stand 30 seconds          Post Biometrics - 07/28/23 0930        Post  Biometrics   Height 6' (1.829 m)    Weight 94.8 kg    Waist Circumference 40 inches    Hip Circumference 43 inches    Waist to Hip Ratio 0.93 %    BMI (Calculated) 28.34    Triceps Skinfold 15 mm    % Body Fat 27.3 %    Grip Strength 40 kg    Flexibility 13.5 in    Single Leg Stand 30 seconds          Nutrition:  Nutrition Therapy & Goals - 08/02/23 1104       Nutrition Therapy   Diet Heart Healthy Diet    Drug/Food Interactions Statins/Certain Fruits      Personal Nutrition Goals   Nutrition Goal Patient to identify strategies for reducing cardiovascular risk by attending the [facility] education and nutrition series weekly.   goal in action.   Personal Goal #2 Patient to improve diet quality by using the plate method as a guide for meal planning to include lean protein/plant protein, fruits, vegetables, whole grains, nonfat dairy as part of a well-balanced diet.   goal in action.   Personal Goal #3 Patient to identify strategies for weight loss of 0.5-2.0# per week.   goal in action.   Comments Goals in action. Virgel has medical history of STEMI, HTN, hyperlipidemia, CAD. He has previously completed cardiac rehab in the past. He has attended the [facility] education/nutrition series regularly. He reports using herbalife protein shake and tea for breakfast/snacks. He is down 15# since starting with our program and is motivated to lose to ~205#. LDL is not at goal <55; he continues zetia , crestor but will consider PCSK9-i. He has adopted many heart healthy dietary changes including increased dietary fiber, decreased saturated fat, and decreased sodium intake. He has good understanding of calorie density, label reading, etc. Patient will benefit  from adherence to nutrition, exercise, and lifestyle modification.      Intervention Plan   Intervention Prescribe, educate and counsel regarding individualized specific dietary modifications aiming towards targeted core components such as weight, hypertension, lipid management, diabetes, heart failure and other comorbidities.;Nutrition handout(s) given to patient.    Expected Outcomes Short Term Goal: Understand basic principles of dietary content, such as calories, fat, sodium, cholesterol and nutrients.;Long Term  Goal: Adherence to prescribed nutrition plan.          Nutrition Discharge:  Nutrition Assessments - 05/07/23 1132       Rate Your Plate Scores   Pre Score 43          Education Questionnaire Score:  Knowledge Questionnaire Score - 08/02/23 0814       Knowledge Questionnaire Score   Post Score 21/24          Goals reviewed with patient; copy given to patient.Pt graduates from  Intensive/Traditional cardiac rehab program 08/02/23 with completion of 32 exercise and 31 education sessions. Pt maintained good attendance and progressed nicely during his participation in rehab as evidenced by increased MET level. Gionni increased his distance by 247 feet and lost 6.8 kg while enrolled in the program.  Medication list reconciled. Repeat  PHQ score- 0 .  Pt has made significant lifestyle changes and should be commended for his success. Hanz  achieved his goals during cardiac rehab.   Pt plans to continue exercise at [facility] 2 days a week and walking . We are proud of Keinan's progress and weight loss.Hadassah Elpidio Quan RN BSN

## 2023-08-09 DIAGNOSIS — H52223 Regular astigmatism, bilateral: Secondary | ICD-10-CM | POA: Diagnosis not present

## 2023-08-09 DIAGNOSIS — H524 Presbyopia: Secondary | ICD-10-CM | POA: Diagnosis not present

## 2023-08-09 DIAGNOSIS — H5203 Hypermetropia, bilateral: Secondary | ICD-10-CM | POA: Diagnosis not present

## 2023-09-28 DIAGNOSIS — L729 Follicular cyst of the skin and subcutaneous tissue, unspecified: Secondary | ICD-10-CM | POA: Diagnosis not present

## 2023-09-30 ENCOUNTER — Other Ambulatory Visit: Payer: Self-pay

## 2023-09-30 ENCOUNTER — Ambulatory Visit: Attending: Cardiovascular Disease | Admitting: Cardiovascular Disease

## 2023-09-30 VITALS — BP 130/70 | HR 63 | Ht 73.0 in | Wt 209.0 lb

## 2023-09-30 DIAGNOSIS — I340 Nonrheumatic mitral (valve) insufficiency: Secondary | ICD-10-CM

## 2023-09-30 DIAGNOSIS — I2510 Atherosclerotic heart disease of native coronary artery without angina pectoris: Secondary | ICD-10-CM | POA: Diagnosis not present

## 2023-09-30 DIAGNOSIS — L729 Follicular cyst of the skin and subcutaneous tissue, unspecified: Secondary | ICD-10-CM

## 2023-09-30 DIAGNOSIS — I10 Essential (primary) hypertension: Secondary | ICD-10-CM

## 2023-09-30 DIAGNOSIS — E7800 Pure hypercholesterolemia, unspecified: Secondary | ICD-10-CM | POA: Diagnosis not present

## 2023-09-30 NOTE — Progress Notes (Signed)
 Chief Complaint  Patient presents with   Follow-up    CAD   History of Present Illness: 68 yo male with history of CAD, HTN and HLD here today for follow up. He has been followed in the past by Dr. Claudene. He had an MI in 2015 and had a stent placed in the LAD. Echo in February 2021 with LVEF=55-60%. Grade 2 diastolic dysfunction. Mild MR. He was admitted to [facility] in March 2025 with an inferior STEMI secondary to occlusion of the distal RCA stent. A new drug eluting stent was placed. Echo with LVEF=50-55%, mild to moderate MR. He was discharged on ASA and Effient .   He is here today for follow up. The patient denies any chest pain, dyspnea, palpitations, lower extremity edema, orthopnea, PND, dizziness, near syncope or syncope.   Primary Care Physician: Corlis Pagan, NP   Past Medical History:  Diagnosis Date   Coronary Artery Disease 04/04/2013   Inf STEMI in 04/2013 s/p 3 x 16 mm DES to RCA     Stress MPI 02/15/15: apical lateral infarct, no ischemia, EF 48    Inf STEMI s/p 3 x 32 mm DES to dRCA in 04/2023      LHC 04/17/23: LM 30; LAD mid 40; RI 60; RCA prox 35, dist 100; EF 55-65     TTE 04/17/23: EF 50-55, NL RVSF, mild to mod MR, AV sclerosis, inf HK     Cough due to ACE inhibitor 05/11/2014   Lisinopril  discontinued 05/11/14    Erectile dysfunction 10/11/2013   Essential hypertension 05/10/2014   History of nuclear stress test    a.Myoview 1/17: EF 48%, apical and apical lateral scar, no ischemia; Intermediate Risk (low EF)   Hyperlipidemia    MI, 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   Inferior STEMI  04/02/13     Past Surgical History:  Procedure Laterality Date   CORONARY/GRAFT ACUTE MI REVASCULARIZATION N/A 04/17/2023   Procedure: Coronary/Graft Acute MI Revascularization;  Surgeon: Swaziland, Peter M, MD;  Location: [facility] INVASIVE CV LAB;  Service: Cardiovascular;  Laterality: N/A;   LEFT HEART CATH AND CORONARY ANGIOGRAPHY N/A 04/17/2023   Procedure: LEFT HEART CATH AND CORONARY ANGIOGRAPHY;  Surgeon: Swaziland,  Peter M, MD;  Location: [facility] INVASIVE CV LAB;  Service: Cardiovascular;  Laterality: N/A;   LEFT HEART CATHETERIZATION WITH CORONARY ANGIOGRAM N/A 04/02/2013   Procedure: LEFT HEART CATHETERIZATION WITH CORONARY ANGIOGRAM;  Surgeon: Victory LELON Claudene DOUGLAS, MD;  Location: [facility] CATH LAB;  Service: Cardiovascular;  Laterality: N/A;   NO PAST SURGERIES      Current Outpatient Medications  Medication Sig Dispense Refill   aspirin  EC 81 MG EC tablet Take 1 tablet (81 mg total) by mouth daily.     atorvastatin  (LIPITOR ) 80 MG tablet Take 1 tablet (80 mg total) by mouth daily. 90 tablet 3   ezetimibe  (ZETIA ) 10 MG tablet Take 1 tablet (10 mg total) by mouth daily. 90 tablet 3   losartan  (COZAAR ) 50 MG tablet Take 50 mg by mouth daily.     metoprolol  succinate (TOPROL -XL) 25 MG 24 hr tablet Take 1 tablet (25 mg total) by mouth daily. 90 tablet 3   nitroGLYCERIN  (NITROSTAT ) 0.4 MG SL tablet Place 1 tablet (0.4 mg total) under the tongue every 5 (five) minutes x 3 doses as needed for chest pain. Do not take within 72 hrs of viagra  25 tablet 3   prasugrel  (EFFIENT ) 10 MG TABS tablet Take 1 tablet (10 mg total) by mouth daily. 90 tablet  3   traZODone (DESYREL) 50 MG tablet Take 1 tablet by mouth as needed.     VIAGRA  100 MG tablet Take 1 tablet (100 mg total) by mouth daily as needed for erectile dysfunction. Do not take within 72 hrs of nitroglycerin  10 tablet 1   No current facility-administered medications for this visit.    Allergies  Allergen Reactions   Other Swelling    All nuts  throw swelling   Shellfish Allergy Hives    Social History   Socioeconomic History   Marital status: Married    Spouse name: Not on file   Number of children: Not on file   Years of education: Not on file   Highest education level: Not on file  Occupational History   Not on file  Tobacco Use   Smoking status: Never   Smokeless tobacco: Never  Vaping Use   Vaping status: Never Used  Substance and Sexual Activity    Alcohol use: No    Alcohol/week: 0.0 standard drinks of alcohol   Drug use: No   Sexual activity: Not on file  Other Topics Concern   Not on file  Social History Narrative   Not on file   Social Drivers of Health   Financial Resource Strain: Not on file  Food Insecurity: Not on file  Transportation Needs: Not on file  Physical Activity: Not on file  Stress: Not on file  Social Connections: Not on file  Intimate Partner Violence: Not on file    Family History  Problem Relation Age of Onset   Other Father     Review of Systems:  As stated in the HPI and otherwise negative.   BP 130/70   Pulse 63   Ht 6' 1 (1.854 m)   Wt 209 lb (94.8 kg)   SpO2 99%   BMI 27.57 kg/m   Physical Examination: General: Well developed, well nourished, NAD  HEENT: OP clear, mucus membranes moist  SKIN: warm, dry. No rashes. Neuro: No focal deficits  Musculoskeletal: Muscle strength 5/5 all ext  Psychiatric: Mood and affect normal  Neck: No JVD, no carotid bruits, no thyromegaly, no lymphadenopathy.  Lungs:Clear bilaterally, no wheezes, rhonci, crackles Cardiovascular: Regular rate and rhythm. No murmurs, gallops or rubs. Abdomen:Soft. Bowel sounds present. Non-tender.  Extremities: No lower extremity edema. Pulses are 2 + in the bilateral DP/PT.  EKG:  EKG is not ordered today. The ekg ordered today demonstrates   Recent Labs: 04/18/2023: BUN 16; Creatinine, Ser 1.11; Hemoglobin 15.0; Magnesium  1.8; Platelets 119; Potassium 3.7; Sodium 139   Lipid Panel    Component Value Date/Time   CHOL 107 04/17/2023 0242   TRIG 21 04/17/2023 0242   HDL 35 (L) 04/17/2023 0242   CHOLHDL 3.1 04/17/2023 0242   VLDL 4 04/17/2023 0242   LDLCALC 68 04/17/2023 0242     Wt Readings from Last 3 Encounters:  09/30/23 209 lb (94.8 kg)  07/28/23 208 lb 15.9 oz (94.8 kg)  04/30/23 225 lb 12 oz (102.4 kg)    Assessment and Plan:   1. CAD without angina: No chest pain. Recent inferior STEMI  secondary to occlusion of the distal RCA stent. Continue DAPT with ASA and Effient  until March 2026. Continue Lipitor , Zetia  and Toprol .    2. Mitral regurgitation: Mild to moderate by echo in March 2025.   3. HTN: BP is controlled. Continue Losartan  and Toprol   4. Hyperlipidemia: Lipids followed in primary care. Goal LDL under 55. His LDL was 42  in May 2025. Continue Lipitor  and Zetia .   Labs/ tests ordered today include:  No orders of the defined types were placed in this encounter.  Disposition:   F/U with me in 6 months   Signed, Lonni Cash, MD, [facility] 09/30/2023 9:18 AM    [facility] [address] [place], [place], [place]  [address] Phone: [phone]; Fax: [phone]

## 2023-09-30 NOTE — Patient Instructions (Signed)
 Medication Instructions:  No changes *If you need a refill on your cardiac medications before your next appointment, please call your pharmacy*  Lab Work: none If you have labs (blood work) drawn today and your tests are completely normal, you will receive your results only by: MyChart Message (if you have MyChart) OR A paper copy in the mail If you have any lab test that is abnormal or we need to change your treatment, we will call you to review the results.  Testing/Procedures: none  Follow-Up: At [facility], you and your health needs are our priority.  As part of our continuing mission to provide you with exceptional heart care, our providers are all part of one team.  This team includes your primary Cardiologist (physician) and Advanced Practice Providers or APPs (Physician Assistants and Nurse Practitioners) who all work together to provide you with the care you need, when you need it.  Your next appointment:   6 month(s)  Provider:   Lonni Cash, MD

## 2023-10-05 ENCOUNTER — Ambulatory Visit
Admission: RE | Admit: 2023-10-05 | Discharge: 2023-10-05 | Disposition: A | Discharge status: Home / Self Care | Source: Ambulatory Visit

## 2023-10-05 DIAGNOSIS — R1901 Right upper quadrant abdominal swelling, mass and lump: Secondary | ICD-10-CM | POA: Diagnosis not present

## 2023-10-05 DIAGNOSIS — R1902 Left upper quadrant abdominal swelling, mass and lump: Secondary | ICD-10-CM | POA: Diagnosis not present

## 2023-10-05 DIAGNOSIS — L729 Follicular cyst of the skin and subcutaneous tissue, unspecified: Secondary | ICD-10-CM

## 2023-10-13 ENCOUNTER — Ambulatory Visit: Admitting: Cardiovascular Disease

## 2023-10-15 DIAGNOSIS — R4589 Other symptoms and signs involving emotional state: Secondary | ICD-10-CM | POA: Diagnosis not present

## 2023-10-15 DIAGNOSIS — M545 Low back pain, unspecified: Secondary | ICD-10-CM | POA: Diagnosis not present

## 2023-10-29 DIAGNOSIS — R222 Localized swelling, mass and lump, trunk: Secondary | ICD-10-CM | POA: Diagnosis not present

## 2023-10-29 DIAGNOSIS — M549 Dorsalgia, unspecified: Secondary | ICD-10-CM | POA: Diagnosis not present

## 2023-10-29 DIAGNOSIS — R1084 Generalized abdominal pain: Secondary | ICD-10-CM | POA: Diagnosis not present

## 2023-11-01 ENCOUNTER — Other Ambulatory Visit: Payer: Self-pay

## 2023-11-01 DIAGNOSIS — R1084 Generalized abdominal pain: Secondary | ICD-10-CM

## 2023-11-01 DIAGNOSIS — M549 Dorsalgia, unspecified: Secondary | ICD-10-CM

## 2023-11-01 DIAGNOSIS — R222 Localized swelling, mass and lump, trunk: Secondary | ICD-10-CM

## 2023-11-08 ENCOUNTER — Other Ambulatory Visit

## 2023-11-09 ENCOUNTER — Ambulatory Visit
Admission: RE | Admit: 2023-11-09 | Discharge: 2023-11-09 | Disposition: A | Discharge status: Home / Self Care | Source: Ambulatory Visit

## 2023-11-09 DIAGNOSIS — M549 Dorsalgia, unspecified: Secondary | ICD-10-CM

## 2023-11-09 DIAGNOSIS — N2889 Other specified disorders of kidney and ureter: Secondary | ICD-10-CM | POA: Diagnosis not present

## 2023-11-09 DIAGNOSIS — R222 Localized swelling, mass and lump, trunk: Secondary | ICD-10-CM

## 2023-11-09 DIAGNOSIS — K760 Fatty (change of) liver, not elsewhere classified: Secondary | ICD-10-CM | POA: Diagnosis not present

## 2023-11-09 DIAGNOSIS — R1084 Generalized abdominal pain: Secondary | ICD-10-CM

## 2023-11-09 MED ORDER — IOPAMIDOL (ISOVUE-300) INJECTION 61%
100.0000 mL | Freq: Once | INTRAVENOUS | Status: AC | PRN
  Administered 2023-11-09: 100 mL via INTRAVENOUS

## 2024-01-03 DIAGNOSIS — R0981 Nasal congestion: Secondary | ICD-10-CM | POA: Diagnosis not present

## 2024-01-03 DIAGNOSIS — I10 Essential (primary) hypertension: Secondary | ICD-10-CM | POA: Diagnosis not present

## 2024-01-05 ENCOUNTER — Telehealth: Payer: Self-pay | Admitting: Cardiovascular Disease

## 2024-01-05 NOTE — Telephone Encounter (Signed)
 Spoke with patient of Dr. Verlin h/o MI. He reports a 2 week history of chest pain/discomfort in evenings when he gets settled in in the evening. He eats his largest meal in the evening. Symptoms occur most often after eating. He has a deep chest hurting that is persistent since last stent that is not new. He is active during the day, working in his building, etc and is without symptoms.   Recently evaluated by PCP >> told to take zantac and mucinex (been on 2 days). He also has head congestion/sinus pressure. PCP ordered recent CT with esophageal thickening ?esophagitis.   Denies: N/V, diaphoresis, shortness of breath  Prior MI symptoms: SOB, nausea, weakness  Lightheaded and weak upon getting up. Eases off when lying down. Hydrates well. Also advised if he has sinus congestion/head pressure this could contribute.   Started turmeric 2 weeks ago.   No NTG use. Compliant with Effient  and ASA  Advised will send note to MD to review.  Appears GI etiology more likely given history, but will defer to provider for review/advice.

## 2024-01-05 NOTE — Telephone Encounter (Signed)
 Spoke with patient. Provided MD advice. He would like OV. Scheduled tomorrow at 3:00pm with Verlin MD

## 2024-01-05 NOTE — Telephone Encounter (Signed)
  Pt c/o of Chest Pain: STAT if active CP, including tightness, pressure, jaw pain, radiating pain to shoulder/upper arm/back, CP unrelieved by Nitro. Symptoms reported of SOB, nausea, vomiting, sweating.  1. Are you having CP right now?   No  2. Are you experiencing any other symptoms (ex. SOB, nausea, vomiting, sweating)?   No  3. Is your CP continuous or coming and going?   Coming and going, off and on for the last 2 weeks  4. Have you taken Nitroglycerin ?   No  5. How long have you been experiencing CP?   Off and on for the last 2 weeks.    6. If NO CP at time of call then end call with telling Pt to call back or call 911 if Chest pain returns prior to return call from triage team.   Patient stated he's feels a pressure on his chest.

## 2024-01-06 ENCOUNTER — Encounter: Payer: Self-pay | Admitting: Cardiovascular Disease

## 2024-01-06 ENCOUNTER — Ambulatory Visit: Attending: Cardiovascular Disease | Admitting: Cardiovascular Disease

## 2024-01-06 VITALS — BP 128/64 | HR 69 | Ht 73.0 in | Wt 209.6 lb

## 2024-01-06 DIAGNOSIS — I10 Essential (primary) hypertension: Secondary | ICD-10-CM | POA: Diagnosis not present

## 2024-01-06 DIAGNOSIS — E7800 Pure hypercholesterolemia, unspecified: Secondary | ICD-10-CM | POA: Diagnosis not present

## 2024-01-06 DIAGNOSIS — I2510 Atherosclerotic heart disease of native coronary artery without angina pectoris: Secondary | ICD-10-CM | POA: Diagnosis not present

## 2024-01-06 DIAGNOSIS — I340 Nonrheumatic mitral (valve) insufficiency: Secondary | ICD-10-CM

## 2024-01-06 NOTE — Patient Instructions (Signed)

## 2024-01-06 NOTE — Progress Notes (Signed)
 Chief Complaint  Patient presents with   Follow-up    CAD   History of Present Illness: 68 yo male with history of CAD, HTN and HLD here today for follow up. He has been followed in the past by Dr. Claudene. He had an MI in 2015 and had a stent placed in the LAD. Echo in February 2021 with LVEF=55-60%. Grade 2 diastolic dysfunction. Mild MR. He was admitted to [facility] in March 2025 with an inferior STEMI secondary to occlusion of the distal RCA stent. A new drug eluting stent was placed. Echo with LVEF=50-55%, mild to moderate MR. He was discharged on ASA and Effient .   He is here today for follow up. He had an episode of upper epigastric pain several days ago after a heavy meal. No exertional chest pain. He has started taking Turmeric. He denies dyspnea, palpitations, lower extremity edema, orthopnea, PND, dizziness, near syncope or syncope. Overall feeling well.   Primary Care Physician: Corlis Pagan, NP   Past Medical History:  Diagnosis Date   Coronary Artery Disease 04/04/2013   Inf STEMI in 04/2013 s/p 3 x 16 mm DES to RCA     Stress MPI 02/15/15: apical lateral infarct, no ischemia, EF 48    Inf STEMI s/p 3 x 32 mm DES to dRCA in 04/2023      LHC 04/17/23: LM 30; LAD mid 40; RI 60; RCA prox 35, dist 100; EF 55-65     TTE 04/17/23: EF 50-55, NL RVSF, mild to mod MR, AV sclerosis, inf HK     Cough due to ACE inhibitor 05/11/2014   Lisinopril  discontinued 05/11/14    Erectile dysfunction 10/11/2013   Essential hypertension 05/10/2014   History of nuclear stress test    a.Myoview 1/17: EF 48%, apical and apical lateral scar, no ischemia; Intermediate Risk (low EF)   Hyperlipidemia    MI, 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   Inferior STEMI  04/02/13     Past Surgical History:  Procedure Laterality Date   CORONARY/GRAFT ACUTE MI REVASCULARIZATION N/A 04/17/2023   Procedure: Coronary/Graft Acute MI Revascularization;  Surgeon: Jordan, Peter M, MD;  Location: [facility] INVASIVE CV LAB;  Service: Cardiovascular;  Laterality:  N/A;   LEFT HEART CATH AND CORONARY ANGIOGRAPHY N/A 04/17/2023   Procedure: LEFT HEART CATH AND CORONARY ANGIOGRAPHY;  Surgeon: Jordan, Peter M, MD;  Location: [facility] INVASIVE CV LAB;  Service: Cardiovascular;  Laterality: N/A;   LEFT HEART CATHETERIZATION WITH CORONARY ANGIOGRAM N/A 04/02/2013   Procedure: LEFT HEART CATHETERIZATION WITH CORONARY ANGIOGRAM;  Surgeon: Victory LELON Claudene DOUGLAS, MD;  Location: [facility] CATH LAB;  Service: Cardiovascular;  Laterality: N/A;   NO PAST SURGERIES      Current Outpatient Medications  Medication Sig Dispense Refill   aspirin  EC 81 MG EC tablet Take 1 tablet (81 mg total) by mouth daily.     atorvastatin  (LIPITOR ) 80 MG tablet Take 1 tablet (80 mg total) by mouth daily. 90 tablet 3   ezetimibe  (ZETIA ) 10 MG tablet Take 1 tablet (10 mg total) by mouth daily. 90 tablet 3   losartan  (COZAAR ) 50 MG tablet Take 50 mg by mouth daily.     metoprolol  succinate (TOPROL -XL) 25 MG 24 hr tablet Take 1 tablet (25 mg total) by mouth daily. 90 tablet 3   nitroGLYCERIN  (NITROSTAT ) 0.4 MG SL tablet Place 1 tablet (0.4 mg total) under the tongue every 5 (five) minutes x 3 doses as needed for chest pain. Do not take within 72 hrs of  viagra  25 tablet 3   prasugrel  (EFFIENT ) 10 MG TABS tablet Take 1 tablet (10 mg total) by mouth daily. 90 tablet 3   traZODone (DESYREL) 50 MG tablet Take 1 tablet by mouth as needed.     VIAGRA  100 MG tablet Take 1 tablet (100 mg total) by mouth daily as needed for erectile dysfunction. Do not take within 72 hrs of nitroglycerin  10 tablet 1   No current facility-administered medications for this visit.    Allergies  Allergen Reactions   Other Swelling    All nuts  throw swelling   Shellfish Allergy Hives    Social History   Socioeconomic History   Marital status: Married    Spouse name: Not on file   Number of children: Not on file   Years of education: Not on file   Highest education level: Not on file  Occupational History   Not on file   Tobacco Use   Smoking status: Never   Smokeless tobacco: Never  Vaping Use   Vaping status: Never Used  Substance and Sexual Activity   Alcohol use: No    Alcohol/week: 0.0 standard drinks of alcohol   Drug use: No   Sexual activity: Not on file  Other Topics Concern   Not on file  Social History Narrative   Not on file   Social Drivers of Health   Financial Resource Strain: Not on file  Food Insecurity: Not on file  Transportation Needs: Not on file  Physical Activity: Not on file  Stress: Not on file  Social Connections: Not on file  Intimate Partner Violence: Not on file    Family History  Problem Relation Age of Onset   Other Father     Review of Systems:  As stated in the HPI and otherwise negative.   BP 128/64   Pulse 69   Ht 6' 1 (1.854 m)   Wt 209 lb 9.6 oz (95.1 kg)   SpO2 97%   BMI 27.65 kg/m   Physical Examination: General: Well developed, well nourished, NAD  HEENT: OP clear, mucus membranes moist  SKIN: warm, dry. No rashes. Neuro: No focal deficits  Musculoskeletal: Muscle strength 5/5 all ext  Psychiatric: Mood and affect normal  Neck: No JVD, no carotid bruits, no thyromegaly, no lymphadenopathy.  Lungs:Clear bilaterally, no wheezes, rhonci, crackles Cardiovascular: Regular rate and rhythm. No murmurs, gallops or rubs. Abdomen:Soft. Bowel sounds present. Non-tender.  Extremities: No lower extremity edema. Pulses are 2 + in the bilateral DP/PT.  EKG:  EKG is ordered today. The ekg ordered today demonstrates  EKG Interpretation Date/Time:  Thursday January 06 2024 15:11:58 EST Ventricular Rate:  59 PR Interval:  168 QRS Duration:  88 QT Interval:  402 QTC Calculation: 397 R Axis:   53  Text Interpretation: Sinus bradycardia Nonspecific T wave abnormality Confirmed by Verlin Bruckner [phone]) on 01/06/2024 3:17:37 PM    Recent Labs: 04/18/2023: BUN 16; Creatinine, Ser 1.11; Hemoglobin 15.0; Magnesium  1.8; Platelets 119;  Potassium 3.7; Sodium 139   Lipid Panel    Component Value Date/Time   CHOL 107 04/17/2023 0242   TRIG 21 04/17/2023 0242   HDL 35 (L) 04/17/2023 0242   CHOLHDL 3.1 04/17/2023 0242   VLDL 4 04/17/2023 0242   LDLCALC 68 04/17/2023 0242     Wt Readings from Last 3 Encounters:  01/06/24 209 lb 9.6 oz (95.1 kg)  09/30/23 209 lb (94.8 kg)  07/28/23 208 lb 15.9 oz (94.8 kg)  Assessment and Plan:   1. CAD without angina: His epigastric pain does not sound cardiac. Inferior STEMI secondary to occlusion of the distal RCA stent in March 2025 with placement of a new drug eluting stent.  -Continue ASA and Effient  until March 2026.  -Continue Lipitor , Zetia  and Toprol .     2. Mitral regurgitation: Mild to moderate by echo in March 2025. Will follow.   3. HTN: BP is well controlled.  -Continue Losartan  and Toprol .   4. Hyperlipidemia: Lipids followed in primary care. Goal LDL under 55. His LDL was 42 in May 2025.  -Continue Lipitor   5. Medication counseling: I have asked him to stop taking Turmeric given potential interactions with Effient , ASA and Losartan .   Labs/ tests ordered today include:   Orders Placed This Encounter  Procedures   EKG 12-Lead   Disposition:   F/U with me in 12 months   Signed, Lonni Cash, MD, [facility] 01/06/2024 3:31 PM    [facility] [address] [place], [place], [place]  [address] Phone: [phone]; Fax: [phone]

## 2024-01-11 DIAGNOSIS — I2510 Atherosclerotic heart disease of native coronary artery without angina pectoris: Secondary | ICD-10-CM | POA: Diagnosis not present

## 2024-01-11 DIAGNOSIS — I10 Essential (primary) hypertension: Secondary | ICD-10-CM | POA: Diagnosis not present

## 2024-01-11 DIAGNOSIS — R7303 Prediabetes: Secondary | ICD-10-CM | POA: Diagnosis not present

## 2024-03-04 NOTE — ED Provider Notes (Signed)
 " ED Provider Note Patient Name: Mark Levine                                                       MRN: [id number] DOB: 1955/09/26 SERVICE DATE: 03/04/24   History   Patient presents with: Chest Pain: Reports chest pain that started 2 weeks ago and hasn't gone away. Hx of heart stents replaced 6 months ago   C/o substernal chest pressure intermittent for the last 2 weeks seems better with exertion.  None now but had this morning.  Episodes last several hours.  Visiting from [facility] and planning to depart on a cruise tomorrow.  Takes effient .      PAST MEDICAL HISTORY  Diagnosis Date   Hypertension     History reviewed. No pertinent surgical history.  No family history on file.  Social History[1]  No Known Allergies  Review of Systems  Constitutional:  Positive for activity change.  HENT: Negative.    Eyes: Negative.   Endocrine: Negative.   Skin: Negative.   Allergic/Immunologic: Negative.   Hematological: Negative.   Psychiatric/Behavioral: Negative.      Physical Exam   Vitals  BP Pulse Temp Temp src Resp SpO2 Weight Height  03/04/24 0737 03/04/24 0737 03/04/24 0737 03/04/24 0735 -- 03/04/24 0737 03/04/24 0735 03/04/24 0735  153/80 63 36.6 C (97.9 F) Oral  98 % 93 kg (205 lb) 1.829 m (6')    Physical Exam Vitals and nursing note reviewed.  Constitutional:      General: He is not in acute distress. HENT:     Head: Normocephalic.  Eyes:     General: No scleral icterus. Pulmonary:     Effort: No respiratory distress.     Breath sounds: No wheezing.  Abdominal:     General: There is no distension.  Musculoskeletal:        General: Normal range of motion.     Cervical back: No tenderness.  Skin:    Findings: No rash.  Psychiatric:        Mood and Affect: Mood normal.                 Diagnostic Testing  ED Labs Ordered and Reviewed - No data to display             Procedures  ED Course / Clinical Impression   Clinical Impressions  as of 03/06/24 0023  Chest discomfort  History of coronary artery disease  Primary hypertension             MDM / Disposition / Plan                      Serial troponins have remained stable.  No significant acute ischemic findings on EKG.  The history is not strongly suggestive of accelerating angina.  At this time he is safe for discharge no other testing or treatment is indicated at this time.  Diagnoses and Differential Diagnoses Evaluated:(R07.89) Chest discomfort  (primary encounter diagnosis) (Z86.79) History of coronary artery disease (I10) Primary hypertension  Differential Diagnoses: Acute coronary syndrome Mark Levine was seen today for chest pain.  Diagnoses and associated orders for this visit:   Chest discomfort   History of coronary artery disease   Primary hypertension   Other orders  ECG COMPLETE W  INTERPRETATION_FL; Standing  ECG COMPLETE W INTERPRETATION_FL  COMPREHENSIVE METABOLIC PANEL; Standing  HIGH SENSITIVITY TROPONIN T (INITIAL); Standing  COMPLETE BLOOD COUNT AND DIFFERENTIAL; Standing  XR CHEST 1V FRONTAL PORT; Standing  Cancel: XR CHEST 1V FRONTAL PORT; Standing  COMPREHENSIVE METABOLIC PANEL  HIGH SENSITIVITY TROPONIN T (INITIAL)  COMPLETE BLOOD COUNT AND DIFFERENTIAL  XR CHEST 1V FRONTAL PORT  Cancel: XR CHEST 1V FRONTAL PORT  RAINBOW DRAW; Standing  RAINBOW DRAW  NT PRO BNP; Standing  NT PRO BNP  HIGH SENSITIVITY TROPONIN T (SECOND); Standing  HIGH SENSITIVITY TROPONIN T (SECOND)  HIGH SENSITIVITY TROPONIN T (THIRD) 3 HRS AFTER INITIAL; Standing  HIGH SENSITIVITY TROPONIN T (THIRD) 3 HRS AFTER INITIAL  Discontinue: famotidine (PEPCID) 40 mg tablet; Take 1 tablet by mouth two times a day for 14 days. Then as needed for heartburn  famotidine (PEPCID) 40 mg tablet; Take 1 tablet by mouth once daily. Please take daily for 14 days then as needed for heartburn           SIGNATURE:  Reyes Left, MD    -        [1] Social History Tobacco Use   Smoking status: Never   Smokeless tobacco: Never  Vaping Use   Vaping status: Never Used  Substance and Sexual Activity   Alcohol use: Not Currently   Drug use: Not on file   Sexual activity: Not on file   MESUK, JEFFREY 03/06/24 0024  "

## 2024-03-04 NOTE — ED Notes (Signed)
 Discharge instructions given, including MyChart access code. Patient verbalizes understanding of discharge instructions, medication and follow up. The patient was also given printed discharge instructions. Patient escorted to hospital exit in stable condition.

## 2024-03-04 NOTE — ED Notes (Signed)
 Bed: ED-16 Expected date:  Expected time:  Means of arrival:  Comments: Mark Levine

## 2024-03-04 NOTE — ED Notes (Signed)
"   Patient taken to bed 16 via walk in; patient wearing an ID band; identifiers verified with name and DOB. Introduced myself as the RN caring for patient. Patient complains of cp. Cardiac monitor, NIBP, and continuous pulse ox initiated. Patient assessed. Allergies reviewed, bed in locked position with side rails up and placed in low position; call light within reach. Comfort measures checked including pain, potty, possessions, position.   "
